# Patient Record
Sex: Male | Born: 1970
Health system: Southern US, Community
[De-identification: ages and names within clinical notes are randomized; demographics above are authoritative.]

## PROBLEM LIST (undated history)

## (undated) DIAGNOSIS — F909 Attention-deficit hyperactivity disorder, unspecified type: Secondary | ICD-10-CM

## (undated) DIAGNOSIS — S0990XA Unspecified injury of head, initial encounter: Secondary | ICD-10-CM

## (undated) DIAGNOSIS — F32A Depression, unspecified: Secondary | ICD-10-CM

## (undated) HISTORY — PX: BRAIN SURGERY: SHX531

## (undated) HISTORY — PX: SHOULDER SURGERY: SHX246

---

## 1997-05-03 DIAGNOSIS — S069XAA Unspecified intracranial injury with loss of consciousness status unknown, initial encounter: Secondary | ICD-10-CM

## 1997-05-03 DIAGNOSIS — S069X9A Unspecified intracranial injury with loss of consciousness of unspecified duration, initial encounter: Secondary | ICD-10-CM

## 1997-05-03 HISTORY — DX: Unspecified intracranial injury with loss of consciousness status unknown, initial encounter: S06.9XAA

## 1997-05-03 HISTORY — DX: Unspecified intracranial injury with loss of consciousness of unspecified duration, initial encounter: S06.9X9A

## 2003-04-24 ENCOUNTER — Emergency Department (HOSPITAL_COMMUNITY): Admission: EM | Admit: 2003-04-24 | Discharge: 2003-04-24 | Payer: Self-pay | Admitting: Emergency Medicine

## 2007-06-21 ENCOUNTER — Emergency Department (HOSPITAL_COMMUNITY): Admission: EM | Admit: 2007-06-21 | Discharge: 2007-06-21 | Payer: Self-pay | Admitting: Emergency Medicine

## 2011-01-22 LAB — D-DIMER, QUANTITATIVE: D-Dimer, Quant: <0.22

## 2011-04-20 ENCOUNTER — Other Ambulatory Visit: Payer: Self-pay

## 2011-04-20 ENCOUNTER — Encounter: Payer: Self-pay | Admitting: *Deleted

## 2011-04-20 ENCOUNTER — Emergency Department (HOSPITAL_COMMUNITY)
Admission: EM | Admit: 2011-04-20 | Discharge: 2011-04-20 | Disposition: A | Discharge status: Home / Self Care | Payer: 59 | Source: Home / Self Care | Attending: Emergency Medicine | Admitting: Emergency Medicine

## 2011-04-20 ENCOUNTER — Emergency Department (INDEPENDENT_AMBULATORY_CARE_PROVIDER_SITE_OTHER): Payer: 59

## 2011-04-20 DIAGNOSIS — J40 Bronchitis, not specified as acute or chronic: Secondary | ICD-10-CM

## 2011-04-20 DIAGNOSIS — R0789 Other chest pain: Secondary | ICD-10-CM

## 2011-04-20 HISTORY — DX: Unspecified injury of head, initial encounter: S09.90XA

## 2011-04-20 MED ORDER — HYDROCOD POLST-CHLORPHEN POLST 10-8 MG/5ML PO LQCR: 5.0000 mL | Freq: Two times a day (BID) | ORAL | Status: DC | PRN

## 2011-04-20 MED ORDER — PREDNISONE 20 MG PO TABS: 40.0000 mg | ORAL_TABLET | Freq: Every day | ORAL | Status: AC

## 2011-04-20 NOTE — ED Notes (Addendum)
Pt had a flu this past Friday, and today woke up to chest pain that comes and goes. Per Pt, he did feel pain to his left arm and back. He can't really tell if it was radiating to his left arm or if it was just pain from the chest.  Pt has no cardiac history and states his chest pain gets worst when he walks or moves. Per Pt, he has never felt this type of chest pain before.

## 2011-04-20 NOTE — ED Notes (Signed)
Pt has been evaluated by Dr. Ladon Applebaum and has had an xray already.  Came in with chest pain that started this am with coughing.  Gets worse with movement.  Has been on Tamiflu since Friday.

## 2011-04-20 NOTE — ED Provider Notes (Addendum)
History     CSN: [id number] Arrival date & time: 04/20/2011 10:53 AM   First MD Initiated Contact with Patient 04/20/11 1106      Chief Complaint  Patient presents with  . Chest Pain    (Consider location/radiation/quality/duration/timing/severity/associated sxs/prior treatment) HPI  Past Medical History  Diagnosis Date  . Head injury   . Attention deficit disorder     Past Surgical History  Procedure Date  . Brain surgery     History reviewed. No pertinent family history.  History  Substance Use Topics  . Smoking status: Never Smoker   . Smokeless tobacco: Not on file  . Alcohol Use: No      Review of Systems  Constitutional: Positive for chills and appetite change.  Respiratory: Positive for cough and chest tightness. Negative for shortness of breath.   Cardiovascular: Positive for chest pain. Negative for palpitations and leg swelling.    Allergies  Review of patient's allergies indicates no known allergies.  Home Medications   Current Outpatient Rx  Name Route Sig Dispense Refill  . AMPHETAMINE-DEXTROAMPHET ER 30 MG PO CP24 Oral Take 30 mg by mouth every morning.      Marland Kitchen AMPHETAMINE-DEXTROAMPHETAMINE 15 MG PO TABS Oral Take 15 mg by mouth 2 (two) times daily.      . CYMBALTA PO Oral Take by mouth 1 day or 1 dose.      . OSELTAMIVIR PHOSPHATE 75 MG PO CAPS Oral Take 75 mg by mouth.      Marland Kitchen HYDROCOD POLST-CHLORPHEN POLST 10-8 MG/5ML PO LQCR Oral Take 5 mLs by mouth every 12 (twelve) hours as needed. 140 mL 0  . PREDNISONE 20 MG PO TABS Oral Take 2 tablets (40 mg total) by mouth daily. Take daily for 5 days 15 tablet 0    BP 154/84  Pulse 126  Temp(Src) 98.8 F (37.1 C) (Oral)  Resp 20  SpO2 99%  Physical Exam  Nursing note and vitals reviewed. Constitutional: He is oriented to person, place, and time. He appears well-developed and well-nourished. No distress.  HENT:  Head: Normocephalic.  Eyes: Conjunctivae are normal.  Neck: Normal range of  motion. No JVD present. No tracheal deviation present.  Cardiovascular: Regular rhythm, normal heart sounds and normal pulses.   No extrasystoles are present. Tachycardia present.  PMI is not displaced.        Heart rate decreased during exam to the mid 80"s  Pulmonary/Chest: Breath sounds normal. No respiratory distress. He has no decreased breath sounds. He has no wheezes. He has no rhonchi. He has no rales.  Lymphadenopathy:    He has no cervical adenopathy.  Neurological: He is alert and oriented to person, place, and time. He has normal strength. No sensory deficit.    ED Course  Procedures (including critical care time)  Labs Reviewed - No data to display Dg Chest 2 View  04/20/2011  *RADIOLOGY REPORT*  Clinical Data: Chest pain, cough, left arm pain  CHEST - 2 VIEW  Comparison: None  Findings: Normal heart size, mediastinal contours, and pulmonary vascularity. Peribronchial thickening without infiltrate or effusion. No pneumothorax. Bones unremarkable.  IMPRESSION: Mild bronchitic changes.  Original Report Authenticated By: Lollie Marrow, M.D.     1. Bronchitis   2. Musculoskeletal chest pain       MDM  ILI  with cough and chest discomforts. On exam patient was not experiencing any pian- EKG and x-rays done-Patient did described that at times feels like it can be chest  wall or lung related pain. No orders found for this or any previous visit.       Jimmie Molly, MD 04/20/11 1528  Jimmie Molly, MD 04/20/11 [phone]

## 2011-05-11 ENCOUNTER — Emergency Department (HOSPITAL_COMMUNITY)
Admission: EM | Admit: 2011-05-11 | Discharge: 2011-05-11 | Disposition: A | Discharge status: Home / Self Care | Payer: 59 | Source: Home / Self Care | Attending: Family Medicine | Admitting: Family Medicine

## 2011-05-11 ENCOUNTER — Encounter (HOSPITAL_COMMUNITY): Payer: Self-pay

## 2011-05-11 DIAGNOSIS — S40011A Contusion of right shoulder, initial encounter: Secondary | ICD-10-CM

## 2011-05-11 DIAGNOSIS — S40019A Contusion of unspecified shoulder, initial encounter: Secondary | ICD-10-CM

## 2011-05-11 MED ORDER — IBUPROFEN 800 MG PO TABS: 800.0000 mg | ORAL_TABLET | Freq: Three times a day (TID) | ORAL | Status: AC

## 2011-05-11 NOTE — ED Provider Notes (Signed)
History     CSN: [id number]  Arrival date & time 05/11/11  8657   First MD Initiated Contact with Patient 05/11/11 0818      Chief Complaint  Patient presents with  . Shoulder Injury    (Consider location/radiation/quality/duration/timing/severity/associated sxs/prior treatment) Patient is a 41 y.o. male presenting with shoulder injury. The history is provided by the patient.  Shoulder Injury This is a new problem. The current episode started yesterday (slipped on stairs and landed on right shoulder directly). The problem has been gradually improving. Associated symptoms comments: No other injury or complaints..    Past Medical History  Diagnosis Date  . Head injury   . Attention deficit disorder     Past Surgical History  Procedure Date  . Brain surgery     No family history on file.  History  Substance Use Topics  . Smoking status: Never Smoker   . Smokeless tobacco: Not on file  . Alcohol Use: No      Review of Systems  Constitutional: Negative.   Cardiovascular: Negative.   Gastrointestinal: Negative.   Musculoskeletal: Negative for back pain and gait problem.  Neurological: Negative.     Allergies  Review of patient's allergies indicates no known allergies.  Home Medications   Current Outpatient Rx  Name Route Sig Dispense Refill  . AMPHETAMINE-DEXTROAMPHET ER 30 MG PO CP24 Oral Take 30 mg by mouth every morning.      Marland Kitchen AMPHETAMINE-DEXTROAMPHETAMINE 15 MG PO TABS Oral Take 15 mg by mouth 2 (two) times daily.      Marland Kitchen HYDROCOD POLST-CPM POLST ER 10-8 MG/5ML PO LQCR Oral Take 5 mLs by mouth every 12 (twelve) hours as needed. 140 mL 0  . CYMBALTA PO Oral Take by mouth 1 day or 1 dose.      . IBUPROFEN 800 MG PO TABS Oral Take 1 tablet (800 mg total) by mouth 3 (three) times daily. 30 tablet 0  . OSELTAMIVIR PHOSPHATE 75 MG PO CAPS Oral Take 75 mg by mouth.        BP 126/87  Pulse 92  Temp(Src) 98.7 F (37.1 C) (Oral)  Resp 16  SpO2 99%  Physical  Exam  Nursing note and vitals reviewed. Constitutional: He is oriented to person, place, and time. He appears well-developed and well-nourished.  HENT:  Head: Normocephalic.  Musculoskeletal: Normal range of motion. He exhibits tenderness.       Arms: Neurological: He is alert and oriented to person, place, and time.    ED Course  Procedures (including critical care time)  Labs Reviewed - No data to display No results found.   1. Contusion of shoulder, right       MDM          Barkley Bruns, MD 05/11/11 [phone]

## 2011-05-11 NOTE — ED Notes (Signed)
Pt states he fell yesterday and landed on his rt shoulder.  C/o dull ache to rt shoulder, pain is worse with movement.

## 2011-06-22 ENCOUNTER — Other Ambulatory Visit (HOSPITAL_COMMUNITY): Payer: Self-pay | Admitting: Orthopedic Surgery

## 2011-06-22 DIAGNOSIS — M25511 Pain in right shoulder: Secondary | ICD-10-CM

## 2011-06-23 ENCOUNTER — Ambulatory Visit (HOSPITAL_COMMUNITY)
Admission: RE | Admit: 2011-06-23 | Discharge: 2011-06-23 | Disposition: A | Discharge status: Home / Self Care | Payer: 59 | Source: Ambulatory Visit | Attending: Orthopedic Surgery | Admitting: Orthopedic Surgery

## 2011-06-23 DIAGNOSIS — W19XXXA Unspecified fall, initial encounter: Secondary | ICD-10-CM | POA: Insufficient documentation

## 2011-06-23 DIAGNOSIS — S46819A Strain of other muscles, fascia and tendons at shoulder and upper arm level, unspecified arm, initial encounter: Secondary | ICD-10-CM | POA: Insufficient documentation

## 2011-06-23 DIAGNOSIS — M25519 Pain in unspecified shoulder: Secondary | ICD-10-CM | POA: Insufficient documentation

## 2011-06-23 DIAGNOSIS — M25511 Pain in right shoulder: Secondary | ICD-10-CM

## 2011-08-19 ENCOUNTER — Encounter (HOSPITAL_COMMUNITY): Payer: Self-pay

## 2011-08-19 ENCOUNTER — Encounter (HOSPITAL_COMMUNITY)
Admission: RE | Admit: 2011-08-19 | Discharge: 2011-08-19 | Disposition: A | Discharge status: Home / Self Care | Payer: 59 | Source: Ambulatory Visit | Attending: Orthopedic Surgery | Admitting: Orthopedic Surgery

## 2011-08-19 LAB — CBC
HCT: 40.8 % (ref 39.0–52.0)
Hemoglobin: 14.5 g/dL (ref 13.0–17.0)
MCH: 29 pg (ref 26.0–34.0)
MCHC: 35.5 g/dL (ref 30.0–36.0)
MCV: 81.6 fL (ref 78.0–100.0)
Platelets: 268 10*3/uL (ref 150–400)
RBC: 5 MIL/uL (ref 4.22–5.81)
RDW: 12.6 % (ref 11.5–15.5)
WBC: 6.8 10*3/uL (ref 4.0–10.5)

## 2011-08-19 LAB — BASIC METABOLIC PANEL
BUN: 17 mg/dL (ref 6–23)
CO2: 28 mEq/L (ref 19–32)
Calcium: 9.4 mg/dL (ref 8.4–10.5)
Chloride: 103 mEq/L (ref 96–112)
Creatinine, Ser: 1.05 mg/dL (ref 0.50–1.35)
GFR calc Af Amer: >90 mL/min (ref >90)
GFR calc non Af Amer: 87 mL/min — ABNORMAL LOW (ref >90)
Glucose, Bld: 104 mg/dL — ABNORMAL HIGH (ref 70–99)
Potassium: 4 mEq/L (ref 3.5–5.1)
Sodium: 139 mEq/L (ref 135–145)

## 2011-08-19 LAB — SURGICAL PCR SCREEN
MRSA, PCR: NEGATIVE
Staphylococcus aureus: POSITIVE — AB

## 2011-08-19 MED ORDER — CHLORHEXIDINE GLUCONATE 4 % EX LIQD: 60.0000 mL | Freq: Once | CUTANEOUS | Status: DC

## 2011-08-19 NOTE — H&P (Signed)
CC: right shoulder pain HPI: 41 y/o male with worsening pain and weakness to right shoulder due to a rotator cuff tear Pt has elected for a rotator cuff repair to restore function to the right upper extremity PMH: ADD Surgical: none Family: hypertension Social: non smoker, occasional ETOH, no illicit drug use ROS: pain and weakness right upper extremity PE: alert and appropriate 41 y/o male in no acute distress Cervical spine: full rom no tenderness, cranial nerves 2-12 intact Right shoulder: mild guarding due to pain  Pain with ER and mild weakness nv intact distally No rashes or edema MRI: right shoulder rotator cuff tear Assessment: right shoulder rotator cuff tear Plan: shoulder scope and rotator cuff repair

## 2011-08-19 NOTE — Pre-Procedure Instructions (Signed)
20 MACINTYRE ALEXA  08/19/2011   Your procedure is scheduled on:  August 27, 2011 0800 am  Report to [facility] at 0600 AM.  Call this number if you have problems the morning of surgery: [phone]   Remember:   Do not eat food:After Midnight.  May have clear liquids: up to 4 Hours before arrival.0200 am  Clear liquids include soda, tea, black coffee, apple or grape juice, broth.  Take these medicines the morning of surgery with A SIP OF WATER: Cymbalta    Do not wear lotions, powders, or perfumes. You may wear deodorant.  Do not shave 48 hours prior to surgery.  Do not bring valuables to the hospital.  Contacts, dentures or bridgework may not be worn into surgery.  Leave suitcase in the car. After surgery it may be brought to your room.  For patients admitted to the hospital, checkout time is 11:00 AM the day of discharge.   Patients discharged the day of surgery will not be allowed to drive home.  Name and phone number of your driver: Spouse  Special Instructions: CHG Shower Use Special Wash: 1/2 bottle night before surgery and 1/2 bottle morning of surgery.   Please read over the following fact sheets that you were given: Pain Booklet, Coughing and Deep Breathing, MRSA Information and Surgical Site Infection Prevention

## 2011-08-26 MED ORDER — SODIUM CHLORIDE 0.9 % IV SOLN: INTRAVENOUS | Status: DC

## 2011-08-26 NOTE — Progress Notes (Signed)
Spoke with the patient... He will arrive at [facility] 08/27/11.Marland Kitchen336-

## 2011-08-27 ENCOUNTER — Ambulatory Visit (HOSPITAL_COMMUNITY): Payer: 59 | Admitting: Anesthesiology

## 2011-08-27 ENCOUNTER — Ambulatory Visit (HOSPITAL_COMMUNITY)
Admission: RE | Admit: 2011-08-27 | Discharge: 2011-08-27 | Disposition: A | Discharge status: Home / Self Care | Payer: 59 | Source: Ambulatory Visit | Attending: Orthopedic Surgery | Admitting: Orthopedic Surgery

## 2011-08-27 ENCOUNTER — Encounter (HOSPITAL_COMMUNITY): Payer: Self-pay | Admitting: Anesthesiology

## 2011-08-27 ENCOUNTER — Encounter (HOSPITAL_COMMUNITY): Payer: Self-pay | Admitting: *Deleted

## 2011-08-27 ENCOUNTER — Encounter (HOSPITAL_COMMUNITY)
Admission: RE | Disposition: A | Discharge status: Home / Self Care | Payer: Self-pay | Source: Ambulatory Visit | Attending: Orthopedic Surgery

## 2011-08-27 DIAGNOSIS — M719 Bursopathy, unspecified: Secondary | ICD-10-CM | POA: Insufficient documentation

## 2011-08-27 DIAGNOSIS — Z5333 Arthroscopic surgical procedure converted to open procedure: Secondary | ICD-10-CM | POA: Insufficient documentation

## 2011-08-27 DIAGNOSIS — Z01812 Encounter for preprocedural laboratory examination: Secondary | ICD-10-CM | POA: Insufficient documentation

## 2011-08-27 DIAGNOSIS — Z01818 Encounter for other preprocedural examination: Secondary | ICD-10-CM | POA: Insufficient documentation

## 2011-08-27 DIAGNOSIS — M67919 Unspecified disorder of synovium and tendon, unspecified shoulder: Secondary | ICD-10-CM | POA: Insufficient documentation

## 2011-08-27 DIAGNOSIS — F988 Other specified behavioral and emotional disorders with onset usually occurring in childhood and adolescence: Secondary | ICD-10-CM | POA: Insufficient documentation

## 2011-08-27 DIAGNOSIS — M24119 Other articular cartilage disorders, unspecified shoulder: Secondary | ICD-10-CM | POA: Insufficient documentation

## 2011-08-27 SURGERY — SHOULDER ARTHROSCOPY WITH SUBACROMIAL DECOMPRESSION AND BICEP TENDON REPAIR
Anesthesia: Regional | Site: Shoulder | Laterality: Right | Wound class: Clean

## 2011-08-27 MED ORDER — BUPIVACAINE-EPINEPHRINE PF 0.5-1:200000 % IJ SOLN
INTRAMUSCULAR | Status: DC | PRN
  Administered 2011-08-27: 30 mL

## 2011-08-27 MED ORDER — SODIUM CHLORIDE 0.9 % IV SOLN
10.0000 mg | INTRAVENOUS | Status: DC | PRN
  Administered 2011-08-27: 50 ug/min via INTRAVENOUS

## 2011-08-27 MED ORDER — ONDANSETRON HCL 4 MG/2ML IJ SOLN
INTRAMUSCULAR | Status: DC | PRN
  Administered 2011-08-27: 4 mg via INTRAVENOUS

## 2011-08-27 MED ORDER — FENTANYL CITRATE 0.05 MG/ML IJ SOLN
INTRAMUSCULAR | Status: DC | PRN
  Administered 2011-08-27: 50 ug via INTRAVENOUS

## 2011-08-27 MED ORDER — LIDOCAINE HCL (CARDIAC) 20 MG/ML IV SOLN
INTRAVENOUS | Status: DC | PRN
  Administered 2011-08-27: 100 mg via INTRAVENOUS

## 2011-08-27 MED ORDER — CEFAZOLIN SODIUM 1-5 GM-% IV SOLN
INTRAVENOUS | Status: AC
  Filled 2011-08-27: qty 100

## 2011-08-27 MED ORDER — BUPIVACAINE-EPINEPHRINE 0.25% -1:200000 IJ SOLN
INTRAMUSCULAR | Status: DC | PRN
  Administered 2011-08-27: 10 mL

## 2011-08-27 MED ORDER — VECURONIUM BROMIDE 10 MG IV SOLR
INTRAVENOUS | Status: DC | PRN
  Administered 2011-08-27: 5 mg via INTRAVENOUS

## 2011-08-27 MED ORDER — LACTATED RINGERS IV SOLN
INTRAVENOUS | Status: DC | PRN
  Administered 2011-08-27 (×2): via INTRAVENOUS

## 2011-08-27 MED ORDER — METHOCARBAMOL 500 MG PO TABS: 500.0000 mg | ORAL_TABLET | Freq: Three times a day (TID) | ORAL | Status: AC | PRN

## 2011-08-27 MED ORDER — PROPOFOL 10 MG/ML IV EMUL
INTRAVENOUS | Status: DC | PRN
  Administered 2011-08-27: 300 mg via INTRAVENOUS

## 2011-08-27 MED ORDER — MIDAZOLAM HCL 5 MG/5ML IJ SOLN
INTRAMUSCULAR | Status: DC | PRN
  Administered 2011-08-27: 2 mg via INTRAVENOUS

## 2011-08-27 MED ORDER — CEFAZOLIN SODIUM-DEXTROSE 2-3 GM-% IV SOLR
2.0000 g | Freq: Three times a day (TID) | INTRAVENOUS | Status: DC
  Administered 2011-08-27: 2 g via INTRAVENOUS

## 2011-08-27 MED ORDER — PHENYLEPHRINE HCL 10 MG/ML IJ SOLN
INTRAMUSCULAR | Status: DC | PRN
  Administered 2011-08-27: 80 ug via INTRAVENOUS
  Administered 2011-08-27: 120 ug via INTRAVENOUS
  Administered 2011-08-27: 80 ug via INTRAVENOUS

## 2011-08-27 MED ORDER — ONDANSETRON HCL 4 MG/2ML IJ SOLN: 4.0000 mg | Freq: Once | INTRAMUSCULAR | Status: DC | PRN

## 2011-08-27 MED ORDER — OXYCODONE-ACETAMINOPHEN 5-325 MG PO TABS: 1.0000 | ORAL_TABLET | ORAL | Status: AC | PRN

## 2011-08-27 MED ORDER — SODIUM CHLORIDE 0.9 % IR SOLN
Status: DC | PRN
  Administered 2011-08-27: 6000 mL

## 2011-08-27 MED ORDER — HYDROMORPHONE HCL PF 1 MG/ML IJ SOLN: 0.2500 mg | INTRAMUSCULAR | Status: DC | PRN

## 2011-08-27 SURGICAL SUPPLY — 78 items
ANCH SUT 1.4 1 LD SFT TIS (Anchor) ×1 IMPLANT
ANCH SUT 4.5 SUBCORTICAL WNG (Anchor) ×2 IMPLANT
ANCHOR JUGGERKNOT SZ1 (Anchor) ×1 IMPLANT
ANCHOR SUT CROSSFT 4.5 #2 (Anchor) ×2 IMPLANT
ANCHOR SUT POPLOK 4.5 (Anchor) ×2 IMPLANT
BLADE LONG MED 31X9 (MISCELLANEOUS) IMPLANT
BLADE SURG 11 STRL SS (BLADE) ×2 IMPLANT
BUR OVAL 4.0 (BURR) IMPLANT
CATH URET WHISTLE 8FR 331008 (CATHETERS) ×1 IMPLANT
CLOTH BEACON ORANGE TIMEOUT ST (SAFETY) ×2 IMPLANT
CLSR STERI-STRIP ANTIMIC 1/2X4 (GAUZE/BANDAGES/DRESSINGS) ×1 IMPLANT
COVER SURGICAL LIGHT HANDLE (MISCELLANEOUS) ×2 IMPLANT
DRAPE INCISE IOBAN 66X45 STRL (DRAPES) ×2 IMPLANT
DRAPE STERI 35X30 U-POUCH (DRAPES) ×2 IMPLANT
DRAPE U-SHAPE 47X51 STRL (DRAPES) ×2 IMPLANT
DRILL BIT 5/64 (BIT) ×2 IMPLANT
DRSG ADAPTIC 3X8 NADH LF (GAUZE/BANDAGES/DRESSINGS) ×1 IMPLANT
DRSG EMULSION OIL 3X3 NADH (GAUZE/BANDAGES/DRESSINGS) ×4 IMPLANT
DRSG PAD ABDOMINAL 8X10 ST (GAUZE/BANDAGES/DRESSINGS) ×3 IMPLANT
DURAPREP 26ML APPLICATOR (WOUND CARE) ×2 IMPLANT
ELECT NDL TIP 2.8 STRL (NEEDLE) ×1 IMPLANT
ELECT NEEDLE TIP 2.8 STRL (NEEDLE) ×2 IMPLANT
ELECT REM PT RETURN 9FT ADLT (ELECTROSURGICAL)
ELECTRODE REM PT RTRN 9FT ADLT (ELECTROSURGICAL) IMPLANT
GLOVE BIOGEL PI ORTHO PRO 7.5 (GLOVE) ×1
GLOVE BIOGEL PI ORTHO PRO SZ8 (GLOVE) ×1
GLOVE ORTHO TXT STRL SZ7.5 (GLOVE) ×2 IMPLANT
GLOVE PI ORTHO PRO STRL 7.5 (GLOVE) ×1 IMPLANT
GLOVE PI ORTHO PRO STRL SZ8 (GLOVE) ×1 IMPLANT
GLOVE SURG ORTHO 8.5 STRL (GLOVE) ×2 IMPLANT
GOWN BRE IMP SLV AUR XL STRL (GOWN DISPOSABLE) ×2 IMPLANT
GOWN STRL NON-REIN LRG LVL3 (GOWN DISPOSABLE) ×3 IMPLANT
GOWN STRL REIN XL XLG (GOWN DISPOSABLE) ×4 IMPLANT
JUGGERKNOT DISPOSABLE KIT ×1 IMPLANT
KIT BASIN OR (CUSTOM PROCEDURE TRAY) ×2 IMPLANT
KIT BIO-TENODESIS 3X8 DISP (MISCELLANEOUS)
KIT INSRT BABSR STRL DISP BTN (MISCELLANEOUS) IMPLANT
KIT JUGGERKNOT DISP 2.9MM (KITS) IMPLANT
KIT ROOM TURNOVER OR (KITS) ×2 IMPLANT
MANIFOLD NEPTUNE II (INSTRUMENTS) ×2 IMPLANT
NDL 1/2 CIR MAYO (NEEDLE) IMPLANT
NDL HYPO 25GX1X1/2 BEV (NEEDLE) ×1 IMPLANT
NDL SPNL 18GX3.5 QUINCKE PK (NEEDLE) ×1 IMPLANT
NDL SUT 6 .5 CRC .975X.05 MAYO (NEEDLE) IMPLANT
NEEDLE 1/2 CIR MAYO (NEEDLE) ×2 IMPLANT
NEEDLE HYPO 25GX1X1/2 BEV (NEEDLE) ×2 IMPLANT
NEEDLE MAYO TAPER (NEEDLE)
NEEDLE SPNL 18GX3.5 QUINCKE PK (NEEDLE) ×2 IMPLANT
NS IRRIG 1000ML POUR BTL (IV SOLUTION) ×2 IMPLANT
PACK SHOULDER (CUSTOM PROCEDURE TRAY) ×2 IMPLANT
PAD ARMBOARD 7.5X6 YLW CONV (MISCELLANEOUS) ×4 IMPLANT
RESECTOR FULL RADIUS 4.2MM (BLADE) IMPLANT
SET ARTHROSCOPY TUBING (MISCELLANEOUS) ×2
SET ARTHROSCOPY TUBING LN (MISCELLANEOUS) ×1 IMPLANT
SET JUGGERKNOT DISP 1.4MM ×1 IMPLANT
SLING ARM FOAM STRAP LRG (SOFTGOODS) ×2 IMPLANT
SLING ARM FOAM STRAP MED (SOFTGOODS) IMPLANT
SPONGE GAUZE 4X4 12PLY (GAUZE/BANDAGES/DRESSINGS) ×2 IMPLANT
SPONGE LAP 4X18 X RAY DECT (DISPOSABLE) ×2 IMPLANT
STRIP CLOSURE SKIN 1/2X4 (GAUZE/BANDAGES/DRESSINGS) ×2 IMPLANT
SUCTION FRAZIER TIP 10 FR DISP (SUCTIONS) ×2 IMPLANT
SUT BONE WAX W31G (SUTURE) IMPLANT
SUT FIBERWIRE #2 38 T-5 BLUE (SUTURE)
SUT MNCRL AB 4-0 PS2 18 (SUTURE) ×2 IMPLANT
SUT VIC AB 0 CT1 27 (SUTURE)
SUT VIC AB 0 CT1 27XBRD ANBCTR (SUTURE) IMPLANT
SUT VIC AB 0 CT2 27 (SUTURE) IMPLANT
SUT VIC AB 2-0 CT1 27 (SUTURE)
SUT VIC AB 2-0 CT1 TAPERPNT 27 (SUTURE) IMPLANT
SUT VICRYL 0 CT 1 36IN (SUTURE) ×6 IMPLANT
SUTURE FIBERWR #2 38 T-5 BLUE (SUTURE) IMPLANT
SYR CONTROL 10ML LL (SYRINGE) ×2 IMPLANT
TAPE CLOTH SURG 6X10 WHT LF (GAUZE/BANDAGES/DRESSINGS) ×1 IMPLANT
TOWEL OR 17X24 6PK STRL BLUE (TOWEL DISPOSABLE) ×2 IMPLANT
TOWEL OR 17X26 10 PK STRL BLUE (TOWEL DISPOSABLE) ×2 IMPLANT
TUBE CONNECTING 12X1/4 (SUCTIONS) ×2 IMPLANT
WAND 90 DEG TURBOVAC W/CORD (SURGICAL WAND) ×2 IMPLANT
WATER STERILE IRR 1000ML POUR (IV SOLUTION) ×2 IMPLANT

## 2011-08-27 NOTE — Anesthesia Procedure Notes (Addendum)
Anesthesia Regional Block:  Interscalene brachial plexus block  Pre-Anesthetic Checklist: ,, timeout performed, Correct Patient, Correct Site, Correct Laterality, Correct Procedure, Correct Position, site marked, Risks and benefits discussed,  Surgical consent,  Pre-op evaluation,  At surgeon's request and post-op pain management  Laterality: Right  Prep: chloraprep       Needles:  Injection technique: Single-shot  Needle Type: Echogenic Stimulator Needle     Needle Length: 5cm 5 cm Needle Gauge: 22 and 22 G    Additional Needles:  Procedures: ultrasound guided and nerve stimulator Interscalene brachial plexus block  Nerve Stimulator or Paresthesia:  Response: biceps flexion, 0.45 mA,   Additional Responses:   Narrative:  Start time: 08/27/2011 6:56 AM End time: 08/27/2011 7:10 AM Injection made incrementally with aspirations every 5 mL.  Performed by: Personally  Anesthesiologist: Dr Chaney Malling  Additional Notes: Functioning IV was confirmed and monitors were applied.  A 50mm 22ga Arrow echogenic stimulator needle was used. Sterile prep and drape,hand hygiene and sterile gloves were used.  Negative aspiration and negative test dose prior to incremental administration of local anesthetic. The patient tolerated the procedure well.  Ultrasound guidance: relevent anatomy identified, needle position confirmed, local anesthetic spread visualized around nerve(s), vascular puncture avoided.  Image printed for medical record.   Interscalene brachial plexus block Procedure Name: Intubation Date/Time: 08/27/2011 7:34 AM Performed by: Carmela Rima Pre-anesthesia Checklist: Emergency Drugs available, Patient identified, Timeout performed, Suction available and Patient being monitored Patient Re-evaluated:Patient Re-evaluated prior to inductionOxygen Delivery Method: Circle system utilized Preoxygenation: Pre-oxygenation with 100% oxygen Intubation Type: IV induction Ventilation:  Mask ventilation without difficulty Laryngoscope Size: Mac and 3 Grade View: Grade I Tube type: Oral Tube size: 8.0 mm Number of attempts: 1 Placement Confirmation: ETT inserted through vocal cords under direct vision,  breath sounds checked- equal and bilateral and positive ETCO2 Secured at: 23 cm Tube secured with: Tape Dental Injury: Teeth and Oropharynx as per pre-operative assessment     Anesthesia Regional Block:   Narrative:

## 2011-08-27 NOTE — Preoperative (Signed)
Beta Blockers   Reason not to administer Beta Blockers:Not Applicable 

## 2011-08-27 NOTE — Discharge Instructions (Signed)
Do pendulum exercises 4 times per day. Keep pillow OR pillow sling under the right arm. Follow up with PT early next week and Dr Ranell Patrick in 2 weeks.  Ice shoulder constantly!!  Change bandages to bandaids on Sunday, ok to shower next Wednesday.

## 2011-08-27 NOTE — Anesthesia Postprocedure Evaluation (Signed)
  Anesthesia Post-op Note  Patient: Adam Bates  Procedure(s) Performed: Procedure(s) (LRB): SHOULDER ARTHROSCOPY WITH SUBACROMIAL DECOMPRESSION AND BICEP TENDON REPAIR (Right)  Patient Location: PACU  Anesthesia Type: GA combined with regional for post-op pain  Level of Consciousness: awake, alert , oriented and patient cooperative  Airway and Oxygen Therapy: Patient Spontanous Breathing and Patient connected to nasal cannula oxygen  Post-op Pain: none  Post-op Assessment: Post-op Vital signs reviewed, Patient's Cardiovascular Status Stable, Respiratory Function Stable, Patent Airway, No signs of Nausea or vomiting and Pain level controlled  Post-op Vital Signs: stable  Complications: No apparent anesthesia complications

## 2011-08-27 NOTE — Anesthesia Preprocedure Evaluation (Addendum)
Anesthesia Evaluation  Patient identified by MRN, date of birth, ID band Patient awake    Reviewed: Allergy & Precautions, H&P , NPO status , Patient's Chart, lab work & pertinent test results  History of Anesthesia Complications (+) DIFFICULT AIRWAY and AWARENESS UNDER ANESTHESIA  Airway Mallampati: I TM Distance: >3 FB Neck ROM: full  Mouth opening: Limited Mouth Opening  Dental  (+) Dental Advidsory Given and Teeth Intact   Pulmonary          Cardiovascular     Neuro/Psych PSYCHIATRIC DISORDERS    GI/Hepatic   Endo/Other    Renal/GU      Musculoskeletal   Abdominal   Peds  Hematology   Anesthesia Other Findings   Reproductive/Obstetrics                          Anesthesia Physical Anesthesia Plan  ASA: I  Anesthesia Plan: General   Post-op Pain Management:    Induction: Intravenous  Airway Management Planned: Oral ETT  Additional Equipment:   Intra-op Plan:   Post-operative Plan:   Informed Consent: I have reviewed the patients History and Physical, chart, labs and discussed the procedure including the risks, benefits and alternatives for the proposed anesthesia with the patient or authorized representative who has indicated his/her understanding and acceptance.   Dental Advisory Given  Plan Discussed with: CRNA, Anesthesiologist and Surgeon  Anesthesia Plan Comments:        Anesthesia Quick Evaluation

## 2011-08-27 NOTE — Transfer of Care (Signed)
Immediate Anesthesia Transfer of Care Note  Patient: Adam Bates  Procedure(s) Performed: Procedure(s) (LRB): SHOULDER ARTHROSCOPY WITH SUBACROMIAL DECOMPRESSION AND BICEP TENDON REPAIR (Right)  Patient Location: PACU  Anesthesia Type: General  Level of Consciousness: awake and oriented  Airway & Oxygen Therapy: Patient Spontanous Breathing and Patient connected to nasal cannula oxygen  Post-op Assessment: Report given to PACU RN, Post -op Vital signs reviewed and stable and Patient moving all extremities  Post vital signs: Reviewed and stable  Complications: No apparent anesthesia complications

## 2011-08-27 NOTE — Progress Notes (Signed)
Orthopedic Tech Progress Note Patient Details:  Adam Bates July 22, 1970 [id number]  Other Ortho Devices Type of Ortho Device: Shoulder abduction pillow Ortho Device Location: [facility]  Ortho Device Interventions: Application   Cammer, Mickie Bail 08/27/2011, 8:58 AM

## 2011-08-27 NOTE — Interval H&P Note (Signed)
History and Physical Interval Note:  08/27/2011 7:22 AM  [name]  has presented today for surgery, with the diagnosis of RIGHT SHOULDER ROTATOR CUFF TEAR AND SUBSCAP TEAR RIGHT SIDE  The various methods of treatment have been discussed with the patient and family. After consideration of risks, benefits and other options for treatment, the patient has consented to  Procedure(s) (LRB): SHOULDER ARTHROSCOPY WITH SUBACROMIAL DECOMPRESSION AND BICEP TENDON REPAIR (Right) as a surgical intervention .  The patients' history has been reviewed, patient examined, no change in status, stable for surgery.  I have reviewed the patients' chart and labs.  Questions were answered to the patient's satisfaction.     Angel Weedon,STEVEN R

## 2011-08-27 NOTE — Brief Op Note (Signed)
08/27/2011  9:53 AM  PATIENT:  Adam Bates  41 y.o. male  PRE-OPERATIVE DIAGNOSIS:  RIGHT SHOULDER ROTATOR CUFF TEAR AND SUBSCAP TEAR RIGHT SIDE, SLAP LESION  POST-OPERATIVE DIAGNOSIS:  RIGHT SHOULDER ROTATOR CUFF TEAR AND SUBSCAP TEAR, SLAP LESION  PROCEDURE:  Procedure(s) (LRB): SHOULDER ARTHROSCOPY WITH SUBACROMIAL DECOMPRESSION AND BICEP TENDODESIS, MINI OPEN RCR, SUBSCAP REPAIR  SURGEON:  Surgeon(s) and Role:    * Verlee Rossetti, MD - Primary  PHYSICIAN ASSISTANT:   ASSISTANTS: Thea Gist, PA-C   ANESTHESIA:   General + ISB  EBL:  Total I/O In: 1600 [I.V.:1600] Out: 200 [Blood:200]  BLOOD ADMINISTERED:none  DRAINS: none   LOCAL MEDICATIONS USED:  MARCAINE     SPECIMEN:  No Specimen  DISPOSITION OF SPECIMEN:  N/A  COUNTS:  YES  TOURNIQUET:  * No tourniquets in log *  DICTATION: .Other Dictation: Dictation Number [id number]  PLAN OF CARE: Discharge to home after PACU  PATIENT DISPOSITION:  PACU - hemodynamically stable.   Delay start of Pharmacological VTE agent (>24hrs) due to surgical blood loss or risk of bleeding: not applicable

## 2011-08-28 NOTE — Op Note (Signed)
Adam Bates, Adam Bates               ACCOUNT NO.:  [id number]  MEDICAL RECORD NO.:  [id number]  LOCATION:  [facility]                         FACILITY:  [facility]  PHYSICIAN:  Almedia Balls. Ranell Patrick, M.D. DATE OF BIRTH:  [birth year]  DATE OF PROCEDURE:  08/27/2011 DATE OF DISCHARGE:                              OPERATIVE REPORT   PREOPERATIVE DIAGNOSIS: 1. Right shoulder rotator cuff tear. 2. Right shoulder SLAP lesion. 3. Right shoulder subscapularis tear.  POSTOP DIAGNOSIS: 1. Right shoulder rotator cuff tear. 2. Right shoulder SLAP lesion. 3. Right shoulder subscapularis tear.  PROCEDURE PERFORMED:  Right shoulder arthroscopy with extensive surgical debridement including debridement of torn superior labrum anterior- posterior arthroscopic biceps tenotomy, arthroscopic subacromial decompression followed by mini open rotator cuff repair, subscap repair, and biceps tenodesis in the groove.  ATTENDING SURGEON:  Almedia Balls. Ranell Patrick, M.D.  ASSISTANT:  Donnie Coffin. Dixon, P.A., who scrubbed during the entire procedure necessary for safe completion of the procedure.  ANESTHESIA:  General anesthesia plus interscalene block anesthesia was used.  ESTIMATED BLOOD LOSS:  Minimal.  FLUID REPLACEMENT:  1200 mL crystalloid,  INSTRUMENT COUNT:  Correct.  COMPLICATIONS:  No complications.  PERIOPERATIVE ANTIBIOTICS:  Perioperative antibiotics were given.  INDICATIONS:  The patient is a 41 year old male with worsening right shoulder pain and functional loss secondary to rotator cuff tear and subscap tear as well as likely a superior labral tear anterior posterior.  Patient has failed all measures of conservative management. MRI scan positive.  The patient presents for operative treatment to restore function, eliminate pain to her shoulder.  Informed consent obtained.  DESCRIPTION OF PROCEDURE:  After an adequate level of anesthesia was achieved, the patient positioned in modified beach-chair  position. Right shoulder exam under anesthesia.  Full passive range of motion of the shoulder, no stiffness, no instability.  Sterile prep and drape, the shoulder and arm and time out was called.  We entered the shoulder using standard portals including anterior, posterior, and lateral portals.  We identified significant tearing in superior labral biceps junction, performed labral debridement, biceps tenotomy.  We debrided back to stable labral rim.  Anterior inferior, posterior inferior labrum intact, articular cartilage normal.  Rotator cuff tear was noted.  This was supraspinatus infraspinatus with traction in the joint.  Upper surface of subscap was torn.  This represented about 10-15% thickness tear.  At this point, we concluded the arthroscopic portion of procedure.  We then entered the subacromial space.  A thorough Bursectomy, acromioplasty was performed creating a type 1 acromial shape with a high-speed bur.  We visualized the torn rotator cuff in the bursal surface and it was again about retracted halfway to the glenoid. We did release the rotator cuff off the superior glenoid and also from underneath the scapula and acromion.  At this point, we concluded the arthroscopic portion of surgery.  We then made a mini open incision starting at the anterolateral border acromion staying distally about 4 cm.  Dissection down through subcu tissues using needle tip Bovie. Deltoid raphe was identified and divided in line with its fibers.  We placed Arthrex retractor.  We then visualized the biceps tendon.  We delivered that into the wound.  We placed the single juggernaut suture anchors, 2-4 biceps groove brought the suture up through the biceps tendon and then we used a baseball stitch technique to do multiple passes of that suture through the tendon and then tied that down, flushed to the bicipital groove.  The upper subscap was torn, it was a fairly small tear, and we incorporated that  in to the repair of the distal supraspinatus with a corner suture anchor at the articular margin, and used that suture to repair the upper subscap as well as the supraspinatus and then did some margin convergence sutures to further reinforce the subscapularis.  We had two 4.5 ConMed biocomposite suture anchors at the articular margin for medial portion of footprint.  We used 2 pop lock anchors out.  These were knotless anchors out laterally and then took our suture strands that we put through the edge of the tendon to free up the tendon.  These were modified Mason-Allen suture with number 2 hi-fi suture.  We did some margin convergence posteriorly and anteriorly with excellent repair, anatomic repair of the supraspinatus infraspinatus tendon, that was torn.  The repair was under tension with the patient's arm at his side, but with just 20 degrees abduction, the tear was not under significant tension, and there was no gapping and repaired all with fully range of the shoulder, no significant loss of external rotation.  We did get out to 60 degrees of external rotation without significant tension on the repair, and we then thoroughly irrigated the subdeltoid interval repair, deltoid anatomically side-to-side with 0 Vicryl suture followed by 2-0 Vicryl for subcutaneous closure and 4-0 Monocryl for skin and portals Steri- Strips applied followed by sterile dressing.     Almedia Balls. Ranell Patrick, M.D.     SRN/MEDQ  D:  08/27/2011  T:  08/27/2011  Job:  [id number]

## 2011-09-27 ENCOUNTER — Emergency Department (HOSPITAL_COMMUNITY)
Admission: EM | Admit: 2011-09-27 | Discharge: 2011-09-27 | Disposition: A | Discharge status: Home / Self Care | Payer: 59 | Source: Home / Self Care | Attending: Emergency Medicine | Admitting: Emergency Medicine

## 2011-09-27 ENCOUNTER — Encounter (HOSPITAL_COMMUNITY): Payer: Self-pay | Admitting: *Deleted

## 2011-09-27 DIAGNOSIS — S61209A Unspecified open wound of unspecified finger without damage to nail, initial encounter: Secondary | ICD-10-CM

## 2011-09-27 DIAGNOSIS — S61210A Laceration without foreign body of right index finger without damage to nail, initial encounter: Secondary | ICD-10-CM

## 2011-09-27 DIAGNOSIS — Z23 Encounter for immunization: Secondary | ICD-10-CM

## 2011-09-27 MED ORDER — TETANUS-DIPHTH-ACELL PERTUSSIS 5-2.5-18.5 LF-MCG/0.5 IM SUSP
0.5000 mL | Freq: Once | INTRAMUSCULAR | Status: AC
  Administered 2011-09-27: 0.5 mL via INTRAMUSCULAR

## 2011-09-27 MED ORDER — TETANUS-DIPHTH-ACELL PERTUSSIS 5-2.5-18.5 LF-MCG/0.5 IM SUSP
INTRAMUSCULAR | Status: AC
  Filled 2011-09-27: qty 0.5

## 2011-09-27 NOTE — Discharge Instructions (Signed)
Return here in 10 days for wound check and suture removal. Return sooner for any signs of infection. Take 1 g of Tylenol 6 mg ibuprofen up to 4 times a day as needed for pain. Do not exceed more than 4 g of acetaminophen in one day.

## 2011-09-27 NOTE — ED Provider Notes (Signed)
History     CSN: [id number]  Arrival date & time 09/27/11  1626   First MD Initiated Contact with Patient 09/27/11 1655      Chief Complaint  Patient presents with  . Extremity Laceration    (Consider location/radiation/quality/duration/timing/severity/associated sxs/prior treatment) HPI Comments: Patient is left-handed male who states that he cut the dorsal aspect of his right index finger on some garden shears several hours ago. He irrigated it with tap water, apply hydrogen peroxide, and applied pressure with hemostasis and presented here. No weakness, paresthesias, limitation of motion, swelling, redness extending of finger. Patient is able to move his finger of difficulty. Tetanus is unknown. He is not a smoker or diabetic.  Patient is a 41 y.o. male presenting with skin laceration. The history is provided by the patient. No language interpreter was used.  Laceration  The incident occurred 1 to 2 hours ago. The laceration is located on the right hand. The laceration is 1 cm in size. The laceration mechanism was a a dirty knife. He reports no foreign bodies present. His tetanus status is unknown.    Past Medical History  Diagnosis Date  . Head injury   . Attention deficit disorder     Past Surgical History  Procedure Date  . Brain surgery   . Shoulder surgery     Family History  Problem Relation Age of Onset  . Anesthesia problems Neg Hx   . Hypotension Neg Hx   . Malignant hyperthermia Neg Hx   . Pseudochol deficiency Neg Hx     History  Substance Use Topics  . Smoking status: Never Smoker   . Smokeless tobacco: Never Used  . Alcohol Use: No      Review of Systems  Allergies  Review of patient's allergies indicates no known allergies.  Home Medications   Current Outpatient Rx  Name Route Sig Dispense Refill  . AMPHETAMINE-DEXTROAMPHET ER 30 MG PO CP24 Oral Take 60 mg by mouth every morning.     Marland Kitchen AMPHETAMINE-DEXTROAMPHETAMINE 10 MG PO TABS Oral Take  10 mg by mouth 2 (two) times daily.    . DULOXETINE HCL 60 MG PO CPEP Oral Take 60 mg by mouth daily.    Marland Kitchen PERCOCET PO Oral Take by mouth as needed. For shoulder surgery 06/2011      BP 139/89  Pulse 92  Temp(Src) 98.1 F (36.7 C) (Oral)  Resp 18  SpO2 99%  Physical Exam  Nursing note and vitals reviewed. Constitutional: He is oriented to person, place, and time. He appears well-developed and well-nourished.  HENT:  Head: Normocephalic and atraumatic.  Eyes: Conjunctivae and EOM are normal.  Neck: Normal range of motion.  Cardiovascular: Normal rate.   Pulmonary/Chest: Effort normal. No respiratory distress.  Abdominal: He exhibits no distension.  Musculoskeletal: Normal range of motion.       Hands:      Avulsion laceration of right index finger see drawing sensation and 2-point discrimination intact. FDP/FDS 5/5, finger extension 5/5 compared to other fingers. No foreign bodies visualized. No injury to the hand.   Neurological: He is alert and oriented to person, place, and time.  Skin: Skin is warm and dry.  Psychiatric: He has a normal mood and affect. His behavior is normal.    ED Course  LACERATION REPAIR Date/Time: 09/27/2011 8:06 PM Performed by: Luiz Blare Authorized by: Luiz Blare Consent: Verbal consent obtained. Risks and benefits: risks, benefits and alternatives were discussed Consent given by: patient Patient  understanding: patient states understanding of the procedure being performed Patient consent: the patient's understanding of the procedure matches consent given Required items: required blood products, implants, devices, and special equipment available Patient identity confirmed: verbally with patient Time out: Immediately prior to procedure a "time out" was called to verify the correct patient, procedure, equipment, support staff and site/side marked as required. Body area: upper extremity Location details: right index  finger Laceration length: 1 cm Contamination: The wound is contaminated. Foreign bodies: no foreign bodies Tendon involvement: none Nerve involvement: none Vascular damage: no Anesthesia: digital block Local anesthetic: lidocaine 2% without epinephrine Anesthetic total: 3 ml Patient sedated: no Preparation: Patient was prepped and draped in the usual sterile fashion. Irrigation solution: tap water (Chlorhexidine surgical scrub) Irrigation method: tap Amount of cleaning: extensive Debridement: minimal Degree of undermining: none Skin closure: 5-0 nylon Number of sutures: 5 Technique: simple and horizontal mattress Approximation: close Approximation difficulty: complex Dressing: 4x4 sterile gauze, splint, antibiotic ointment and pressure dressing Patient tolerance: Patient tolerated the procedure well with no immediate complications. Comments: Explored wound through full range of motion with adequate hemostasis. No vascular, tendon nerve, bone involvement noted.   (including critical care time)  Labs Reviewed - No data to display No results found.   1. Laceration of right index finger w/o foreign body w/o damage to nail       MDM  Updated tetanus. Splinted finger. Home with Percocet and ibuprofen. He will return here in 10 days for reevaluation and suture removal. Discussed signs and symptoms that should prompt his return.  Luiz Blare, MD 09/29/11 [phone]

## 2011-09-27 NOTE — ED Notes (Signed)
Reports laceration to right index finger from hedge trimmers approx 3 hrs ago.  Avulsion laceration noted.  Bleeding controlled.  Full ROM of finger.  Denies parasthesias.  Had soaked finger in H2O2 at home.  Right hand currently soaking in Hibiclens solution.

## 2015-06-25 MED FILL — AMPHETAMINE SALTS 10 MG TAB: 10 | 90 days supply | Qty: 180 | Fill #0

## 2015-06-25 MED FILL — DEXTROAMP-AMPHET ER 30 MG C: 30 | 90 days supply | Qty: 180 | Fill #0

## 2015-07-04 MED FILL — DULoxetine HCL 60 MG CPEP: 60 | 90 days supply | Qty: 90 | Fill #1

## 2015-10-02 MED FILL — DEXTROAMP-AMP 10 MG TAB: 10 | 90 days supply | Qty: 180 | Fill #0

## 2015-10-02 MED FILL — DULoxetine HCL 60 MG CPEP: 60 | 90 days supply | Qty: 90 | Fill #0

## 2015-10-02 MED FILL — DEXTROAMP-AMPHET ER 30 MG C: 30 | 90 days supply | Qty: 180 | Fill #0

## 2016-01-21 MED FILL — DEXTROAMP-AMPHET ER 30 MG C: 30 | 90 days supply | Qty: 180 | Fill #0

## 2016-01-21 MED FILL — DEXTROAMP-AMP 10 MG TAB: 10 | 90 days supply | Qty: 180 | Fill #0

## 2016-04-08 MED FILL — DULoxetine HCL 60 MG CPEP: 60 | 90 days supply | Qty: 90 | Fill #1

## 2016-04-22 MED FILL — DEXTROAMP-AMPHETAMIN 10 MG: 10 | 90 days supply | Qty: 180 | Fill #0

## 2016-04-22 MED FILL — DEXTROAMP-AMPHET ER 30 MG C: 30 | 90 days supply | Qty: 180 | Fill #0

## 2016-05-21 DIAGNOSIS — H524 Presbyopia: Secondary | ICD-10-CM | POA: Diagnosis not present

## 2016-07-02 DIAGNOSIS — M25572 Pain in left ankle and joints of left foot: Secondary | ICD-10-CM | POA: Diagnosis not present

## 2016-07-30 MED FILL — ADDERALL XR 30 MG CAP SA: 30 | 90 days supply | Qty: 180 | Fill #0

## 2016-07-30 MED FILL — DEXTROAMP-AMPHETAMIN 10 MG: 10 | 90 days supply | Qty: 180 | Fill #0

## 2016-08-13 MED FILL — DULoxetine HCL 60 MG CPEP: 60 | 90 days supply | Qty: 90 | Fill #2

## 2016-11-23 MED FILL — DULoxetine HCL 60 MG CPEP: 60 | 90 days supply | Qty: 90 | Fill #0

## 2016-11-23 MED FILL — ADDERALL XR 30 MG CAP SA: 30 | 90 days supply | Qty: 180 | Fill #0

## 2017-02-21 MED FILL — AMOXICILLIN 500 MG CAPSULE: 500 | 7 days supply | Qty: 28 | Fill #0

## 2017-03-01 MED FILL — IBUPROFEN 800 MG TABS: 800 | 12 days supply | Qty: 12 | Fill #0

## 2017-03-01 MED FILL — AMOXICILLIN 500 MG CAPSULE: 500 | 5 days supply | Qty: 15 | Fill #0

## 2017-03-03 MED FILL — ADDERALL XR 30 MG CAP SA: 30 | 90 days supply | Qty: 180 | Fill #0

## 2017-03-03 MED FILL — DEXTROAMP-AMP 10 MG TAB: 10 | 90 days supply | Qty: 180 | Fill #0

## 2017-04-07 MED FILL — IBUPROFEN 800 MG TAB: 800 | 4 days supply | Qty: 12 | Fill #0

## 2017-04-07 MED FILL — AMOXICILLIN 500 MG CAPSULE: 500 | 5 days supply | Qty: 15 | Fill #0

## 2017-04-22 MED FILL — DULoxetine HCL 60 MG CPEP: 60 | 90 days supply | Qty: 90 | Fill #1

## 2017-06-02 MED FILL — ADDERALL XR 30 MG CAP SA: 30 | 90 days supply | Qty: 180 | Fill #0

## 2017-06-02 MED FILL — AMPHETAMINE-DEXTROAMPHETAMI: 10 | 90 days supply | Qty: 180 | Fill #0

## 2017-06-27 MED FILL — AMOXICILLIN 500 MG CAPSULE: 500 | 5 days supply | Qty: 15 | Fill #0

## 2017-09-02 MED FILL — DULoxetine HCL 60 MG CPEP: 60 | 90 days supply | Qty: 90 | Fill #2

## 2017-09-09 MED FILL — ADDERALL XR 30 MG CAP SA: 30 | 90 days supply | Qty: 180 | Fill #0

## 2017-09-09 MED FILL — DEXTROAMP-AMP 10 MG TAB: 10 | 90 days supply | Qty: 180 | Fill #0

## 2017-10-27 ENCOUNTER — Ambulatory Visit (INDEPENDENT_AMBULATORY_CARE_PROVIDER_SITE_OTHER): Payer: Self-pay | Admitting: Family Medicine

## 2017-10-27 ENCOUNTER — Encounter: Payer: Self-pay | Admitting: Family Medicine

## 2017-10-27 VITALS — BP 102/82 | HR 89 | Temp 98.9°F | Ht 70.0 in | Wt 208.6 lb

## 2017-10-27 DIAGNOSIS — Z0000 Encounter for general adult medical examination without abnormal findings: Secondary | ICD-10-CM

## 2017-10-27 NOTE — Patient Instructions (Signed)

## 2017-10-27 NOTE — Progress Notes (Signed)
HOYTE ZIEBELL is a 47 y.o. male who presents today with concerns of need for a employer directed annual physical exam. He reports that due to visit infrequency he was dropped by his PCP and that local facilities have a long waiting list for new patient appointments.  Review of Systems  Constitutional: Negative for chills, fever and malaise/fatigue.  HENT: Negative for congestion, ear discharge, ear pain, sinus pain and sore throat.   Eyes: Negative.   Respiratory: Negative for cough, sputum production and shortness of breath.   Cardiovascular: Negative.  Negative for chest pain.  Gastrointestinal: Negative for abdominal pain, diarrhea, nausea and vomiting.  Genitourinary: Negative for dysuria, frequency, hematuria and urgency.  Musculoskeletal: Negative for myalgias.  Skin: Negative.   Neurological: Negative for headaches.  Endo/Heme/Allergies: Negative.   Psychiatric/Behavioral: Negative.     O: Vitals:   10/27/17 1002  BP: 102/82  Pulse: 89  Temp: 98.9 F (37.2 C)  SpO2: 96%     Physical Exam  Constitutional: He is oriented to person, place, and time. Vital signs are normal. He appears well-developed and well-nourished. He is active.  Non-toxic appearance. He does not have a sickly appearance.  HENT:  Head: Normocephalic.  Right Ear: Hearing, tympanic membrane, external ear and ear canal normal.  Left Ear: Hearing, tympanic membrane, external ear and ear canal normal.  Nose: Nose normal.  Mouth/Throat: Uvula is midline and oropharynx is clear and moist.  Neck: Normal range of motion. Neck supple.  Cardiovascular: Normal rate, regular rhythm, normal heart sounds and normal pulses.  Pulmonary/Chest: Effort normal and breath sounds normal.  Abdominal: Soft. Bowel sounds are normal.  Musculoskeletal: Normal range of motion.  Lymphadenopathy:       Head (right side): No submental and no submandibular adenopathy present.       Head (left side): No submental and no  submandibular adenopathy present.    He has no cervical adenopathy.  Neurological: He is alert and oriented to person, place, and time.  Psychiatric: He has a normal mood and affect.  Vitals reviewed.  A: 1. Physical exam    P: Exam findings, diagnosis etiology and medication use and indications reviewed with patient. Follow- Up and discharge instructions provided. No emergent/urgent issues found on exam.  Patient verbalized understanding of information provided and agrees with plan of care (POC), all questions answered.  1. Physical exam WNL-completed- provided information on how to obtain a PCP.

## 2017-12-06 MED FILL — AMPHETAMINE-DEXTROAMPHETAMI: 10 | 90 days supply | Qty: 180 | Fill #0

## 2017-12-06 MED FILL — ADDERALL XR 30 MG CAP SA: 30 | 90 days supply | Qty: 180 | Fill #0

## 2018-01-06 MED FILL — DULoxetine HCL 60 MG CPEP: 60 | 90 days supply | Qty: 90 | Fill #0

## 2018-03-17 MED FILL — AMPHETAMINE-DEXTROAMPHETAMI: 10 | 90 days supply | Qty: 180 | Fill #0

## 2018-03-20 MED FILL — ADDERALL XR 30 MG CAP SA: 30 | 90 days supply | Qty: 180 | Fill #0

## 2018-04-24 MED FILL — DULOXETINE HCL 60 MG CPEP: 60 | 90 days supply | Qty: 90 | Fill #1

## 2018-06-13 MED FILL — AMPHETAMINE-DEXTROAMPHETAMI: 10 | 90 days supply | Qty: 180 | Fill #0

## 2018-06-16 MED FILL — ADDERALL XR 30 MG CAP SA: 30 | 90 days supply | Qty: 180 | Fill #0

## 2018-08-02 MED FILL — DULOXETINE HCL 60 MG CPEP: 60 | 90 days supply | Qty: 90 | Fill #0

## 2018-09-15 MED FILL — ADDERALL XR 30 MG CAP SA: 30 | 90 days supply | Qty: 180 | Fill #0

## 2018-09-15 MED FILL — AMPHETAMINE-DEXTROAMPHETAMI: 10 | 90 days supply | Qty: 180 | Fill #0

## 2018-12-12 MED FILL — AMPHETAMINE-DEXTROAMPHETAMI: 10 | 90 days supply | Qty: 180 | Fill #0

## 2018-12-12 MED FILL — ADDERALL XR 30 MG CAP SA: 30 | 90 days supply | Qty: 180 | Fill #0

## 2018-12-12 MED FILL — DULOXETINE HCL 60 MG CPEP: 60 | 90 days supply | Qty: 90 | Fill #1

## 2018-12-22 DIAGNOSIS — H524 Presbyopia: Secondary | ICD-10-CM | POA: Diagnosis not present

## 2019-03-13 MED FILL — ADDERALL XR 30 MG CAP SA: 30 | 90 days supply | Qty: 180 | Fill #0

## 2019-03-13 MED FILL — AMPHETAMINE-DEXTROAMPHETAMI: 10 | 90 days supply | Qty: 180 | Fill #0

## 2019-03-18 MED FILL — DULOXETINE HCL 60 MG CPEP: 60 | 90 days supply | Qty: 90 | Fill #2

## 2019-05-20 DIAGNOSIS — Z20828 Contact with and (suspected) exposure to other viral communicable diseases: Secondary | ICD-10-CM | POA: Diagnosis not present

## 2019-05-20 DIAGNOSIS — M791 Myalgia, unspecified site: Secondary | ICD-10-CM | POA: Diagnosis not present

## 2019-05-20 DIAGNOSIS — R509 Fever, unspecified: Secondary | ICD-10-CM | POA: Diagnosis not present

## 2019-06-11 MED FILL — AMPHETAMINE-DEXTROAMPHETAMI: 10 | 90 days supply | Qty: 180 | Fill #0

## 2019-06-11 MED FILL — ADDERALL XR 30 MG CAP SA: 30 | 90 days supply | Qty: 180 | Fill #0

## 2019-07-24 MED FILL — DULOXETINE HCL 60 MG CPEP: 60 | 90 days supply | Qty: 90 | Fill #0

## 2020-04-17 ENCOUNTER — Emergency Department (HOSPITAL_COMMUNITY): Payer: Managed Care, Other (non HMO)

## 2020-04-17 ENCOUNTER — Emergency Department (HOSPITAL_COMMUNITY): Payer: Managed Care, Other (non HMO) | Admitting: Anesthesiology

## 2020-04-17 ENCOUNTER — Inpatient Hospital Stay (HOSPITAL_COMMUNITY)
Admission: EM | Admit: 2020-04-17 | Discharge: 2020-05-09 | DRG: 003 | Disposition: A | Discharge status: Rehabilitation Facility | Payer: Managed Care, Other (non HMO) | Attending: Physician Assistant | Admitting: Physician Assistant

## 2020-04-17 ENCOUNTER — Other Ambulatory Visit: Payer: Self-pay

## 2020-04-17 ENCOUNTER — Encounter (HOSPITAL_COMMUNITY): Payer: Self-pay | Admitting: Emergency Medicine

## 2020-04-17 ENCOUNTER — Encounter (HOSPITAL_COMMUNITY): Admission: EM | Disposition: A | Discharge status: Rehabilitation Facility | Payer: Self-pay | Source: Home / Self Care

## 2020-04-17 DIAGNOSIS — S72141A Displaced intertrochanteric fracture of right femur, initial encounter for closed fracture: Secondary | ICD-10-CM | POA: Diagnosis present

## 2020-04-17 DIAGNOSIS — D62 Acute posthemorrhagic anemia: Secondary | ICD-10-CM | POA: Diagnosis present

## 2020-04-17 DIAGNOSIS — J90 Pleural effusion, not elsewhere classified: Secondary | ICD-10-CM | POA: Diagnosis present

## 2020-04-17 DIAGNOSIS — Z91030 Bee allergy status: Secondary | ICD-10-CM

## 2020-04-17 DIAGNOSIS — E876 Hypokalemia: Secondary | ICD-10-CM | POA: Diagnosis present

## 2020-04-17 DIAGNOSIS — R1312 Dysphagia, oropharyngeal phase: Secondary | ICD-10-CM | POA: Diagnosis not present

## 2020-04-17 DIAGNOSIS — Z430 Encounter for attention to tracheostomy: Secondary | ICD-10-CM

## 2020-04-17 DIAGNOSIS — Z20822 Contact with and (suspected) exposure to covid-19: Secondary | ICD-10-CM | POA: Diagnosis present

## 2020-04-17 DIAGNOSIS — J939 Pneumothorax, unspecified: Secondary | ICD-10-CM

## 2020-04-17 DIAGNOSIS — L509 Urticaria, unspecified: Secondary | ICD-10-CM | POA: Diagnosis not present

## 2020-04-17 DIAGNOSIS — S0011XA Contusion of right eyelid and periocular area, initial encounter: Secondary | ICD-10-CM | POA: Diagnosis present

## 2020-04-17 DIAGNOSIS — S069X0D Unspecified intracranial injury without loss of consciousness, subsequent encounter: Secondary | ICD-10-CM | POA: Diagnosis not present

## 2020-04-17 DIAGNOSIS — F919 Conduct disorder, unspecified: Secondary | ICD-10-CM | POA: Diagnosis not present

## 2020-04-17 DIAGNOSIS — Z9911 Dependence on respirator [ventilator] status: Secondary | ICD-10-CM | POA: Diagnosis not present

## 2020-04-17 DIAGNOSIS — T1490XA Injury, unspecified, initial encounter: Secondary | ICD-10-CM | POA: Diagnosis present

## 2020-04-17 DIAGNOSIS — R000 Tachycardia, unspecified: Secondary | ICD-10-CM | POA: Diagnosis present

## 2020-04-17 DIAGNOSIS — T368X5A Adverse effect of other systemic antibiotics, initial encounter: Secondary | ICD-10-CM | POA: Diagnosis not present

## 2020-04-17 DIAGNOSIS — Z79899 Other long term (current) drug therapy: Secondary | ICD-10-CM

## 2020-04-17 DIAGNOSIS — J9811 Atelectasis: Secondary | ICD-10-CM | POA: Diagnosis present

## 2020-04-17 DIAGNOSIS — S066X9A Traumatic subarachnoid hemorrhage with loss of consciousness of unspecified duration, initial encounter: Secondary | ICD-10-CM | POA: Diagnosis present

## 2020-04-17 DIAGNOSIS — S022XXA Fracture of nasal bones, initial encounter for closed fracture: Secondary | ICD-10-CM | POA: Diagnosis present

## 2020-04-17 DIAGNOSIS — Z931 Gastrostomy status: Secondary | ICD-10-CM

## 2020-04-17 DIAGNOSIS — R195 Other fecal abnormalities: Secondary | ICD-10-CM | POA: Diagnosis not present

## 2020-04-17 DIAGNOSIS — S82201B Unspecified fracture of shaft of right tibia, initial encounter for open fracture type I or II: Secondary | ICD-10-CM | POA: Diagnosis present

## 2020-04-17 DIAGNOSIS — Z881 Allergy status to other antibiotic agents status: Secondary | ICD-10-CM

## 2020-04-17 DIAGNOSIS — F05 Delirium due to known physiological condition: Secondary | ICD-10-CM | POA: Diagnosis not present

## 2020-04-17 DIAGNOSIS — Z978 Presence of other specified devices: Secondary | ICD-10-CM

## 2020-04-17 DIAGNOSIS — Z23 Encounter for immunization: Secondary | ICD-10-CM

## 2020-04-17 DIAGNOSIS — T426X5A Adverse effect of other antiepileptic and sedative-hypnotic drugs, initial encounter: Secondary | ICD-10-CM | POA: Diagnosis not present

## 2020-04-17 DIAGNOSIS — J9601 Acute respiratory failure with hypoxia: Secondary | ICD-10-CM | POA: Diagnosis present

## 2020-04-17 DIAGNOSIS — S2242XA Multiple fractures of ribs, left side, initial encounter for closed fracture: Secondary | ICD-10-CM | POA: Diagnosis present

## 2020-04-17 DIAGNOSIS — J9690 Respiratory failure, unspecified, unspecified whether with hypoxia or hypercapnia: Secondary | ICD-10-CM

## 2020-04-17 DIAGNOSIS — Z9289 Personal history of other medical treatment: Secondary | ICD-10-CM

## 2020-04-17 DIAGNOSIS — S069X5S Unspecified intracranial injury with loss of consciousness greater than 24 hours with return to pre-existing conscious level, sequela: Secondary | ICD-10-CM | POA: Diagnosis not present

## 2020-04-17 DIAGNOSIS — S062X9A Diffuse traumatic brain injury with loss of consciousness of unspecified duration, initial encounter: Principal | ICD-10-CM | POA: Diagnosis present

## 2020-04-17 DIAGNOSIS — D181 Lymphangioma, any site: Secondary | ICD-10-CM | POA: Diagnosis present

## 2020-04-17 DIAGNOSIS — Y95 Nosocomial condition: Secondary | ICD-10-CM | POA: Diagnosis not present

## 2020-04-17 DIAGNOSIS — T361X5A Adverse effect of cephalosporins and other beta-lactam antibiotics, initial encounter: Secondary | ICD-10-CM | POA: Diagnosis not present

## 2020-04-17 DIAGNOSIS — G479 Sleep disorder, unspecified: Secondary | ICD-10-CM | POA: Diagnosis not present

## 2020-04-17 DIAGNOSIS — S82201S Unspecified fracture of shaft of right tibia, sequela: Secondary | ICD-10-CM | POA: Diagnosis not present

## 2020-04-17 DIAGNOSIS — I82409 Acute embolism and thrombosis of unspecified deep veins of unspecified lower extremity: Secondary | ICD-10-CM | POA: Diagnosis not present

## 2020-04-17 DIAGNOSIS — L8990 Pressure ulcer of unspecified site, unspecified stage: Secondary | ICD-10-CM | POA: Insufficient documentation

## 2020-04-17 DIAGNOSIS — S82401B Unspecified fracture of shaft of right fibula, initial encounter for open fracture type I or II: Secondary | ICD-10-CM | POA: Diagnosis present

## 2020-04-17 DIAGNOSIS — B958 Unspecified staphylococcus as the cause of diseases classified elsewhere: Secondary | ICD-10-CM | POA: Diagnosis not present

## 2020-04-17 DIAGNOSIS — S0285XA Fracture of orbit, unspecified, initial encounter for closed fracture: Secondary | ICD-10-CM | POA: Diagnosis present

## 2020-04-17 DIAGNOSIS — J189 Pneumonia, unspecified organism: Secondary | ICD-10-CM | POA: Diagnosis not present

## 2020-04-17 DIAGNOSIS — S069X0S Unspecified intracranial injury without loss of consciousness, sequela: Secondary | ICD-10-CM | POA: Diagnosis not present

## 2020-04-17 DIAGNOSIS — T148XXA Other injury of unspecified body region, initial encounter: Secondary | ICD-10-CM | POA: Diagnosis not present

## 2020-04-17 DIAGNOSIS — R1319 Other dysphagia: Secondary | ICD-10-CM | POA: Diagnosis not present

## 2020-04-17 DIAGNOSIS — S270XXA Traumatic pneumothorax, initial encounter: Secondary | ICD-10-CM | POA: Diagnosis present

## 2020-04-17 DIAGNOSIS — R40243 Glasgow coma scale score 3-8, unspecified time: Secondary | ICD-10-CM | POA: Diagnosis present

## 2020-04-17 DIAGNOSIS — H9190 Unspecified hearing loss, unspecified ear: Secondary | ICD-10-CM | POA: Diagnosis present

## 2020-04-17 DIAGNOSIS — Z4659 Encounter for fitting and adjustment of other gastrointestinal appliance and device: Secondary | ICD-10-CM

## 2020-04-17 DIAGNOSIS — F909 Attention-deficit hyperactivity disorder, unspecified type: Secondary | ICD-10-CM | POA: Diagnosis present

## 2020-04-17 DIAGNOSIS — S27322A Contusion of lung, bilateral, initial encounter: Secondary | ICD-10-CM | POA: Diagnosis present

## 2020-04-17 DIAGNOSIS — S36892A Contusion of other intra-abdominal organs, initial encounter: Secondary | ICD-10-CM | POA: Diagnosis present

## 2020-04-17 DIAGNOSIS — Z87820 Personal history of traumatic brain injury: Secondary | ICD-10-CM

## 2020-04-17 DIAGNOSIS — Z818 Family history of other mental and behavioral disorders: Secondary | ICD-10-CM

## 2020-04-17 DIAGNOSIS — S069X3S Unspecified intracranial injury with loss of consciousness of 1 hour to 5 hours 59 minutes, sequela: Secondary | ICD-10-CM | POA: Diagnosis not present

## 2020-04-17 DIAGNOSIS — I959 Hypotension, unspecified: Secondary | ICD-10-CM | POA: Diagnosis present

## 2020-04-17 DIAGNOSIS — S066X9D Traumatic subarachnoid hemorrhage with loss of consciousness of unspecified duration, subsequent encounter: Secondary | ICD-10-CM | POA: Diagnosis not present

## 2020-04-17 DIAGNOSIS — S82401S Unspecified fracture of shaft of right fibula, sequela: Secondary | ICD-10-CM | POA: Diagnosis not present

## 2020-04-17 DIAGNOSIS — R509 Fever, unspecified: Secondary | ICD-10-CM

## 2020-04-17 DIAGNOSIS — F32A Depression, unspecified: Secondary | ICD-10-CM | POA: Diagnosis present

## 2020-04-17 DIAGNOSIS — Z930 Tracheostomy status: Secondary | ICD-10-CM

## 2020-04-17 DIAGNOSIS — R451 Restlessness and agitation: Secondary | ICD-10-CM | POA: Diagnosis not present

## 2020-04-17 DIAGNOSIS — S0081XA Abrasion of other part of head, initial encounter: Secondary | ICD-10-CM | POA: Diagnosis present

## 2020-04-17 DIAGNOSIS — R7881 Bacteremia: Secondary | ICD-10-CM | POA: Diagnosis not present

## 2020-04-17 DIAGNOSIS — S36893A Laceration of other intra-abdominal organs, initial encounter: Secondary | ICD-10-CM | POA: Diagnosis present

## 2020-04-17 DIAGNOSIS — Z888 Allergy status to other drugs, medicaments and biological substances status: Secondary | ICD-10-CM

## 2020-04-17 DIAGNOSIS — F0781 Postconcussional syndrome: Secondary | ICD-10-CM | POA: Diagnosis present

## 2020-04-17 DIAGNOSIS — N390 Urinary tract infection, site not specified: Secondary | ICD-10-CM | POA: Diagnosis not present

## 2020-04-17 DIAGNOSIS — S7290XA Unspecified fracture of unspecified femur, initial encounter for closed fracture: Secondary | ICD-10-CM

## 2020-04-17 DIAGNOSIS — Z431 Encounter for attention to gastrostomy: Secondary | ICD-10-CM

## 2020-04-17 DIAGNOSIS — S72141D Displaced intertrochanteric fracture of right femur, subsequent encounter for closed fracture with routine healing: Secondary | ICD-10-CM | POA: Diagnosis not present

## 2020-04-17 HISTORY — DX: Depression, unspecified: F32.A

## 2020-04-17 HISTORY — DX: Attention-deficit hyperactivity disorder, unspecified type: F90.9

## 2020-04-17 HISTORY — PX: LAPAROTOMY: SHX154

## 2020-04-17 LAB — PROTIME-INR
INR: 1 (ref 0.8–1.2)
Prothrombin Time: 13.2 seconds (ref 11.4–15.2)

## 2020-04-17 LAB — LACTIC ACID, PLASMA: Lactic Acid, Venous: 2.8 mmol/L (ref 0.5–1.9)

## 2020-04-17 LAB — COMPREHENSIVE METABOLIC PANEL
ALT: 50 U/L — ABNORMAL HIGH (ref 0–44)
AST: 53 U/L — ABNORMAL HIGH (ref 15–41)
Albumin: 3.6 g/dL (ref 3.5–5.0)
Alkaline Phosphatase: 71 U/L (ref 38–126)
Anion gap: 11 (ref 5–15)
BUN: 20 mg/dL (ref 6–20)
CO2: 24 mmol/L (ref 22–32)
Calcium: 8.5 mg/dL — ABNORMAL LOW (ref 8.9–10.3)
Chloride: 104 mmol/L (ref 98–111)
Creatinine, Ser: 1.36 mg/dL — ABNORMAL HIGH (ref 0.61–1.24)
GFR, Estimated: >60 mL/min (ref >60)
Glucose, Bld: 151 mg/dL — ABNORMAL HIGH (ref 70–99)
Potassium: 3.4 mmol/L — ABNORMAL LOW (ref 3.5–5.1)
Sodium: 139 mmol/L (ref 135–145)
Total Bilirubin: 0.7 mg/dL (ref 0.3–1.2)
Total Protein: 6.2 g/dL — ABNORMAL LOW (ref 6.5–8.1)

## 2020-04-17 LAB — CBC
HCT: 40.2 % (ref 39.0–52.0)
Hemoglobin: 13.1 g/dL (ref 13.0–17.0)
MCH: 28.7 pg (ref 26.0–34.0)
MCHC: 32.6 g/dL (ref 30.0–36.0)
MCV: 88 fL (ref 80.0–100.0)
Platelets: 293 10*3/uL (ref 150–400)
RBC: 4.57 MIL/uL (ref 4.22–5.81)
RDW: 12.4 % (ref 11.5–15.5)
WBC: 9.9 10*3/uL (ref 4.0–10.5)
nRBC: 0 % (ref 0.0–0.2)

## 2020-04-17 LAB — I-STAT CHEM 8, ED
BUN: 22 mg/dL — ABNORMAL HIGH (ref 6–20)
Calcium, Ion: 1.06 mmol/L — ABNORMAL LOW (ref 1.15–1.40)
Chloride: 104 mmol/L (ref 98–111)
Creatinine, Ser: 1.3 mg/dL — ABNORMAL HIGH (ref 0.61–1.24)
Glucose, Bld: 150 mg/dL — ABNORMAL HIGH (ref 70–99)
HCT: 37 % — ABNORMAL LOW (ref 39.0–52.0)
Hemoglobin: 12.6 g/dL — ABNORMAL LOW (ref 13.0–17.0)
Potassium: 3.5 mmol/L (ref 3.5–5.1)
Sodium: 141 mmol/L (ref 135–145)
TCO2: 23 mmol/L (ref 22–32)

## 2020-04-17 LAB — I-STAT ARTERIAL BLOOD GAS, ED
Acid-base deficit: 2 mmol/L (ref 0.0–2.0)
Bicarbonate: 26.1 mmol/L (ref 20.0–28.0)
Calcium, Ion: 1.04 mmol/L — ABNORMAL LOW (ref 1.15–1.40)
HCT: 31 % — ABNORMAL LOW (ref 39.0–52.0)
Hemoglobin: 10.5 g/dL — ABNORMAL LOW (ref 13.0–17.0)
O2 Saturation: 100 %
Patient temperature: 98
Potassium: 3.5 mmol/L (ref 3.5–5.1)
Sodium: 141 mmol/L (ref 135–145)
TCO2: 28 mmol/L (ref 22–32)
pCO2 arterial: 59.4 mmHg — ABNORMAL HIGH (ref 32.0–48.0)
pH, Arterial: 7.248 — ABNORMAL LOW (ref 7.350–7.450)
pO2, Arterial: 308 mmHg — ABNORMAL HIGH (ref 83.0–108.0)

## 2020-04-17 LAB — URINALYSIS, ROUTINE W REFLEX MICROSCOPIC
Bacteria, UA: NONE SEEN
Bilirubin Urine: NEGATIVE
Glucose, UA: NEGATIVE mg/dL
Ketones, ur: NEGATIVE mg/dL
Leukocytes,Ua: NEGATIVE
Nitrite: NEGATIVE
Protein, ur: 30 mg/dL — AB
Specific Gravity, Urine: 1.031 — ABNORMAL HIGH (ref 1.005–1.030)
pH: 6 (ref 5.0–8.0)

## 2020-04-17 LAB — ABO/RH: ABO/RH(D): A POS

## 2020-04-17 LAB — ETHANOL: Alcohol, Ethyl (B): <10 mg/dL (ref <10)

## 2020-04-17 LAB — RESP PANEL BY RT-PCR (FLU A&B, COVID) ARPGX2
Influenza A by PCR: NEGATIVE
Influenza B by PCR: NEGATIVE
SARS Coronavirus 2 by RT PCR: NEGATIVE

## 2020-04-17 SURGERY — LAPAROTOMY, EXPLORATORY
Anesthesia: General

## 2020-04-17 MED ORDER — SODIUM CHLORIDE 0.9 % IV SOLN
INTRAVENOUS | Status: AC | PRN
  Administered 2020-04-17: 1000 mL via INTRAVENOUS

## 2020-04-17 MED ORDER — SODIUM CHLORIDE 0.9 % IV SOLN
INTRAVENOUS | Status: AC | PRN
  Administered 2020-04-17: 125 mL/h via INTRAVENOUS

## 2020-04-17 MED ORDER — CEFAZOLIN SODIUM-DEXTROSE 1-4 GM/50ML-% IV SOLN
INTRAVENOUS | Status: AC | PRN
  Administered 2020-04-17: 2 g via INTRAVENOUS

## 2020-04-17 MED ORDER — LEVETIRACETAM IN NACL 500 MG/100ML IV SOLN
500.0000 mg | Freq: Two times a day (BID) | INTRAVENOUS | Status: DC
  Administered 2020-04-18: 500 mg via INTRAVENOUS
  Filled 2020-04-17: qty 100

## 2020-04-17 MED ORDER — PROPOFOL 1000 MG/100ML IV EMUL
INTRAVENOUS | Status: AC | PRN
  Administered 2020-04-17: 5 ug via INTRAVENOUS

## 2020-04-17 MED ORDER — CEFAZOLIN SODIUM-DEXTROSE 1-4 GM/50ML-% IV SOLN: 1.0000 g | Freq: Once | INTRAVENOUS | Status: DC

## 2020-04-17 MED ORDER — ROCURONIUM BROMIDE 50 MG/5ML IV SOLN
INTRAVENOUS | Status: AC | PRN
  Administered 2020-04-17: 100 mg via INTRAVENOUS

## 2020-04-17 MED ORDER — FENTANYL CITRATE (PF) 250 MCG/5ML IJ SOLN
INTRAMUSCULAR | Status: AC
  Filled 2020-04-17: qty 5

## 2020-04-17 MED ORDER — 0.9 % SODIUM CHLORIDE (POUR BTL) OPTIME
TOPICAL | Status: DC | PRN
  Administered 2020-04-17: 1000 mL
  Administered 2020-04-18: 3000 mL

## 2020-04-17 MED ORDER — TETANUS-DIPHTH-ACELL PERTUSSIS 5-2.5-18.5 LF-MCG/0.5 IM SUSY
0.5000 mL | PREFILLED_SYRINGE | Freq: Once | INTRAMUSCULAR | Status: AC
  Administered 2020-04-17: 0.5 mL via INTRAMUSCULAR

## 2020-04-17 MED ORDER — LIDOCAINE HCL (PF) 1 % IJ SOLN
5.0000 mL | Freq: Once | INTRAMUSCULAR | Status: AC
  Administered 2020-04-17: 5 mL via INTRADERMAL
  Filled 2020-04-17: qty 5

## 2020-04-17 MED ORDER — MIDAZOLAM HCL 2 MG/2ML IJ SOLN
INTRAMUSCULAR | Status: AC
  Filled 2020-04-17: qty 2

## 2020-04-17 MED ORDER — FENTANYL CITRATE (PF) 100 MCG/2ML IJ SOLN
INTRAMUSCULAR | Status: AC
  Filled 2020-04-17: qty 2

## 2020-04-17 MED ORDER — FENTANYL CITRATE (PF) 100 MCG/2ML IJ SOLN
50.0000 ug | Freq: Once | INTRAMUSCULAR | Status: AC
  Administered 2020-04-17: 50 ug via INTRAVENOUS
  Filled 2020-04-17: qty 2

## 2020-04-17 MED ORDER — IOHEXOL 300 MG/ML  SOLN
100.0000 mL | Freq: Once | INTRAMUSCULAR | Status: AC | PRN
  Administered 2020-04-17: 100 mL via INTRAVENOUS

## 2020-04-17 MED ORDER — ETOMIDATE 2 MG/ML IV SOLN
INTRAVENOUS | Status: AC | PRN
  Administered 2020-04-17: 20 mg via INTRAVENOUS

## 2020-04-17 SURGICAL SUPPLY — 41 items
APL PRP STRL LF DISP 70% ISPRP (MISCELLANEOUS) ×1
BLADE CLIPPER SURG (BLADE) IMPLANT
CANISTER SUCT 3000ML PPV (MISCELLANEOUS) ×2 IMPLANT
CHLORAPREP W/TINT 26 (MISCELLANEOUS) ×2 IMPLANT
COVER SURGICAL LIGHT HANDLE (MISCELLANEOUS) ×2 IMPLANT
COVER WAND RF STERILE (DRAPES) ×2 IMPLANT
DRAPE LAPAROSCOPIC ABDOMINAL (DRAPES) ×2 IMPLANT
DRAPE WARM FLUID 44X44 (DRAPES) ×2 IMPLANT
DRSG OPSITE POSTOP 4X10 (GAUZE/BANDAGES/DRESSINGS) ×1 IMPLANT
DRSG OPSITE POSTOP 4X12 (GAUZE/BANDAGES/DRESSINGS) ×1 IMPLANT
DRSG OPSITE POSTOP 4X8 (GAUZE/BANDAGES/DRESSINGS) IMPLANT
ELECT BLADE 6.5 EXT (BLADE) IMPLANT
ELECT CAUTERY BLADE 6.4 (BLADE) ×2 IMPLANT
ELECT REM PT RETURN 9FT ADLT (ELECTROSURGICAL) ×2
ELECTRODE REM PT RTRN 9FT ADLT (ELECTROSURGICAL) ×1 IMPLANT
GLOVE BIOGEL M STRL SZ7.5 (GLOVE) ×2 IMPLANT
GLOVE INDICATOR 8.0 STRL GRN (GLOVE) ×4 IMPLANT
GOWN STRL REUS W/ TWL LRG LVL3 (GOWN DISPOSABLE) ×1 IMPLANT
GOWN STRL REUS W/TWL 2XL LVL3 (GOWN DISPOSABLE) ×2 IMPLANT
GOWN STRL REUS W/TWL LRG LVL3 (GOWN DISPOSABLE) ×2
HANDLE SUCTION POOLE (INSTRUMENTS) ×1 IMPLANT
KIT BASIN OR (CUSTOM PROCEDURE TRAY) ×2 IMPLANT
KIT TURNOVER KIT B (KITS) ×2 IMPLANT
LIGASURE IMPACT 36 18CM CVD LR (INSTRUMENTS) IMPLANT
NS IRRIG 1000ML POUR BTL (IV SOLUTION) ×6 IMPLANT
PACK GENERAL/GYN (CUSTOM PROCEDURE TRAY) ×2 IMPLANT
PAD ARMBOARD 7.5X6 YLW CONV (MISCELLANEOUS) ×2 IMPLANT
PENCIL SMOKE EVACUATOR (MISCELLANEOUS) ×2 IMPLANT
SPECIMEN JAR LARGE (MISCELLANEOUS) IMPLANT
SPONGE LAP 18X18 RF (DISPOSABLE) IMPLANT
STAPLER VISISTAT 35W (STAPLE) ×2 IMPLANT
SUCTION POOLE HANDLE (INSTRUMENTS) ×2
SUT PDS AB 1 TP1 96 (SUTURE) ×4 IMPLANT
SUT SILK 2 0 SH CR/8 (SUTURE) ×2 IMPLANT
SUT SILK 2 0 TIES 10X30 (SUTURE) ×2 IMPLANT
SUT SILK 3 0 SH CR/8 (SUTURE) ×2 IMPLANT
SUT SILK 3 0 TIES 10X30 (SUTURE) ×2 IMPLANT
SUT VIC AB 3-0 SH 18 (SUTURE) IMPLANT
TOWEL GREEN STERILE (TOWEL DISPOSABLE) ×2 IMPLANT
TRAY FOLEY MTR SLVR 16FR STAT (SET/KITS/TRAYS/PACK) IMPLANT
YANKAUER SUCT BULB TIP NO VENT (SUCTIONS) IMPLANT
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
## 2020-04-17 NOTE — Consult Note (Signed)
Responded to page, pt unavailable, no family present, please page again if need chaplain services.  Rev. Wm Sahagun A. Roque Cash
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
## 2020-04-17 NOTE — ED Notes (Signed)
2nd bag red blood cell started

## 2020-04-17 NOTE — ED Notes (Signed)
First unit Red blood cell started.

## 2020-04-17 NOTE — ED Provider Notes (Signed)
CHEST TUBE INSERTION  Date/Time: 04/17/2020 10:49 PM Performed by: Vanna Scotland, MD Authorized by: Greer Pickerel, MD   Consent:    Consent obtained:  Emergent situation Universal protocol:    Patient identity confirmed:  Arm band Pre-procedure details:    Skin preparation:  Chlorhexidine   Preparation: Patient was prepped and draped in the usual sterile fashion   Procedure details:    Placement location:  L lateral   Scalpel size:  11   Ultrasound guidance: no     Tension pneumothorax: no     Tube connected to:  Suction   Drainage characteristics:  Air only   Dressing:  4x4 sterile gauze Post-procedure details:    Post-insertion x-ray findings: tube in good position     Procedure completion:  Tolerated well, no immediate complications      Vanna Scotland, MD 39/71/41 0677    Floyd, [place], DO 05/05/20 1507

## 2020-04-17 NOTE — ED Notes (Signed)
Second unit completed.

## 2020-04-17 NOTE — ED Notes (Signed)
Patient transported to CT 

## 2020-04-17 NOTE — Progress Notes (Signed)
Orthopedic Tech Progress Note Patient Details:  Adam Bates Sep 08, 1970 [id number]  Ortho Devices Type of Ortho Device: Post (short leg) splint,Stirrup splint Ortho Device/Splint Location: rle. I applied a plaster posterior short leg and stirrup with the ortho P.A.. Ortho Device/Splint Interventions: Ordered,Application,Adjustment   Post Interventions Patient Tolerated: Well Instructions Provided: Care of device,Adjustment of device   Karolee Stamps 04/17/2020, 11:39 PM

## 2020-04-18 ENCOUNTER — Inpatient Hospital Stay (HOSPITAL_COMMUNITY): Payer: Managed Care, Other (non HMO) | Admitting: Certified Registered"

## 2020-04-18 ENCOUNTER — Inpatient Hospital Stay (HOSPITAL_COMMUNITY): Payer: Managed Care, Other (non HMO)

## 2020-04-18 ENCOUNTER — Encounter (HOSPITAL_COMMUNITY): Admission: EM | Disposition: A | Discharge status: Rehabilitation Facility | Payer: Self-pay | Source: Home / Self Care

## 2020-04-18 ENCOUNTER — Encounter (HOSPITAL_COMMUNITY): Payer: Self-pay

## 2020-04-18 DIAGNOSIS — S72141A Displaced intertrochanteric fracture of right femur, initial encounter for closed fracture: Secondary | ICD-10-CM

## 2020-04-18 DIAGNOSIS — S82201S Unspecified fracture of shaft of right tibia, sequela: Secondary | ICD-10-CM

## 2020-04-18 DIAGNOSIS — S82201B Unspecified fracture of shaft of right tibia, initial encounter for open fracture type I or II: Secondary | ICD-10-CM

## 2020-04-18 DIAGNOSIS — S82401S Unspecified fracture of shaft of right fibula, sequela: Secondary | ICD-10-CM

## 2020-04-18 HISTORY — PX: TIBIA IM NAIL INSERTION: SHX2516

## 2020-04-18 HISTORY — PX: FEMUR IM NAIL: SHX1597

## 2020-04-18 LAB — COMPREHENSIVE METABOLIC PANEL
ALT: 36 U/L (ref 0–44)
AST: 54 U/L — ABNORMAL HIGH (ref 15–41)
Albumin: 2.8 g/dL — ABNORMAL LOW (ref 3.5–5.0)
Alkaline Phosphatase: 43 U/L (ref 38–126)
Anion gap: 10 (ref 5–15)
BUN: 16 mg/dL (ref 6–20)
CO2: 23 mmol/L (ref 22–32)
Calcium: 7.6 mg/dL — ABNORMAL LOW (ref 8.9–10.3)
Chloride: 109 mmol/L (ref 98–111)
Creatinine, Ser: 0.97 mg/dL (ref 0.61–1.24)
GFR, Estimated: >60 mL/min (ref >60)
Glucose, Bld: 156 mg/dL — ABNORMAL HIGH (ref 70–99)
Potassium: 3.3 mmol/L — ABNORMAL LOW (ref 3.5–5.1)
Sodium: 142 mmol/L (ref 135–145)
Total Bilirubin: 1 mg/dL (ref 0.3–1.2)
Total Protein: 4.4 g/dL — ABNORMAL LOW (ref 6.5–8.1)

## 2020-04-18 LAB — POCT I-STAT 7, (LYTES, BLD GAS, ICA,H+H)
Acid-base deficit: 3 mmol/L — ABNORMAL HIGH (ref 0.0–2.0)
Acid-base deficit: 3 mmol/L — ABNORMAL HIGH (ref 0.0–2.0)
Bicarbonate: 24.3 mmol/L (ref 20.0–28.0)
Bicarbonate: 22.8 mmol/L (ref 20.0–28.0)
Calcium, Ion: 1.08 mmol/L — ABNORMAL LOW (ref 1.15–1.40)
Calcium, Ion: 1.11 mmol/L — ABNORMAL LOW (ref 1.15–1.40)
HCT: 31 % — ABNORMAL LOW (ref 39.0–52.0)
HCT: 25 % — ABNORMAL LOW (ref 39.0–52.0)
Hemoglobin: 10.5 g/dL — ABNORMAL LOW (ref 13.0–17.0)
Hemoglobin: 8.5 g/dL — ABNORMAL LOW (ref 13.0–17.0)
O2 Saturation: 100 %
O2 Saturation: 96 %
Patient temperature: 35.8
Potassium: 3.3 mmol/L — ABNORMAL LOW (ref 3.5–5.1)
Potassium: 4.6 mmol/L (ref 3.5–5.1)
Sodium: 143 mmol/L (ref 135–145)
Sodium: 141 mmol/L (ref 135–145)
TCO2: 26 mmol/L (ref 22–32)
TCO2: 24 mmol/L (ref 22–32)
pCO2 arterial: 49.1 mmHg — ABNORMAL HIGH (ref 32.0–48.0)
pCO2 arterial: 41.9 mmHg (ref 32.0–48.0)
pH, Arterial: 7.296 — ABNORMAL LOW (ref 7.350–7.450)
pH, Arterial: 7.344 — ABNORMAL LOW (ref 7.350–7.450)
pO2, Arterial: 379 mmHg — ABNORMAL HIGH (ref 83.0–108.0)
pO2, Arterial: 84 mmHg (ref 83.0–108.0)

## 2020-04-18 LAB — CBC
HCT: 32.4 % — ABNORMAL LOW (ref 39.0–52.0)
Hemoglobin: 10.9 g/dL — ABNORMAL LOW (ref 13.0–17.0)
MCH: 28.4 pg (ref 26.0–34.0)
MCHC: 33.6 g/dL (ref 30.0–36.0)
MCV: 84.4 fL (ref 80.0–100.0)
Platelets: 162 10*3/uL (ref 150–400)
RBC: 3.84 MIL/uL — ABNORMAL LOW (ref 4.22–5.81)
RDW: 13.6 % (ref 11.5–15.5)
WBC: 10.7 10*3/uL — ABNORMAL HIGH (ref 4.0–10.5)
nRBC: 0 % (ref 0.0–0.2)

## 2020-04-18 LAB — SURGICAL PCR SCREEN
MRSA, PCR: NEGATIVE
Staphylococcus aureus: POSITIVE — AB

## 2020-04-18 LAB — TRIGLYCERIDES: Triglycerides: 65 mg/dL (ref <150)

## 2020-04-18 LAB — GLUCOSE, CAPILLARY
Glucose-Capillary: 118 mg/dL — ABNORMAL HIGH (ref 70–99)
Glucose-Capillary: 131 mg/dL — ABNORMAL HIGH (ref 70–99)

## 2020-04-18 LAB — HIV ANTIBODY (ROUTINE TESTING W REFLEX): HIV Screen 4th Generation wRfx: NONREACTIVE

## 2020-04-18 LAB — BLOOD PRODUCT ORDER (VERBAL) VERIFICATION

## 2020-04-18 SURGERY — INSERTION, INTRAMEDULLARY ROD, FEMUR
Anesthesia: General | Site: Leg Upper | Laterality: Right

## 2020-04-18 MED ORDER — ROCURONIUM BROMIDE 10 MG/ML (PF) SYRINGE
PREFILLED_SYRINGE | INTRAVENOUS | Status: AC
  Filled 2020-04-18: qty 20

## 2020-04-18 MED ORDER — POLYETHYLENE GLYCOL 3350 17 G PO PACK
17.0000 g | PACK | Freq: Every day | ORAL | Status: DC
  Administered 2020-04-21 – 2020-04-24 (×4): 17 g
  Filled 2020-04-18 (×4): qty 1

## 2020-04-18 MED ORDER — ROCURONIUM 10MG/ML (10ML) SYRINGE FOR MEDFUSION PUMP - OPTIME
INTRAVENOUS | Status: DC | PRN
  Administered 2020-04-17 – 2020-04-18 (×2): 50 mg via INTRAVENOUS

## 2020-04-18 MED ORDER — FENTANYL CITRATE (PF) 250 MCG/5ML IJ SOLN
INTRAMUSCULAR | Status: DC | PRN
  Administered 2020-04-18: 50 ug via INTRAVENOUS

## 2020-04-18 MED ORDER — SODIUM CHLORIDE 0.9 % IV SOLN: INTRAVENOUS | Status: DC | PRN

## 2020-04-18 MED ORDER — PHENYLEPHRINE HCL-NACL 10-0.9 MG/250ML-% IV SOLN
0.0000 ug/min | INTRAVENOUS | Status: DC
  Administered 2020-04-19: 3 ug/min via INTRAVENOUS
  Administered 2020-04-19: 30 ug/min via INTRAVENOUS
  Filled 2020-04-18 (×3): qty 250

## 2020-04-18 MED ORDER — CHLORHEXIDINE GLUCONATE CLOTH 2 % EX PADS
6.0000 | MEDICATED_PAD | Freq: Every day | CUTANEOUS | Status: DC
  Administered 2020-04-19 (×2): 6 via TOPICAL

## 2020-04-18 MED ORDER — FENTANYL 2500MCG IN NS 250ML (10MCG/ML) PREMIX INFUSION
50.0000 ug/h | INTRAVENOUS | Status: AC
  Administered 2020-04-18: 50 ug/h via INTRAVENOUS
  Administered 2020-04-19: 55 ug/h via INTRAVENOUS
  Administered 2020-04-21: 100 ug/h via INTRAVENOUS
  Administered 2020-04-22: 150 ug/h via INTRAVENOUS
  Administered 2020-04-23: 200 ug/h via INTRAVENOUS
  Administered 2020-04-23 – 2020-04-24 (×2): 150 ug/h via INTRAVENOUS
  Administered 2020-04-25: 175 ug/h via INTRAVENOUS
  Administered 2020-04-25: 125 ug/h via INTRAVENOUS
  Administered 2020-04-26: 150 ug/h via INTRAVENOUS
  Filled 2020-04-18 (×10): qty 250

## 2020-04-18 MED ORDER — FENTANYL BOLUS VIA INFUSION
50.0000 ug | INTRAVENOUS | Status: AC | PRN
Start: 2020-04-18 — End: 2020-04-26
  Administered 2020-04-21 – 2020-04-24 (×17): 50 ug via INTRAVENOUS
  Filled 2020-04-18: qty 50

## 2020-04-18 MED ORDER — PROPOFOL 1000 MG/100ML IV EMUL
0.0000 ug/kg/min | INTRAVENOUS | Status: DC
  Administered 2020-04-18: 30 ug/kg/min via INTRAVENOUS
  Administered 2020-04-18: 20 ug/kg/min via INTRAVENOUS
  Administered 2020-04-18: 40 ug/kg/min via INTRAVENOUS
  Administered 2020-04-18: 20 ug/kg/min via INTRAVENOUS
  Administered 2020-04-19: 10 ug/kg/min via INTRAVENOUS
  Administered 2020-04-19: 35 ug/kg/min via INTRAVENOUS
  Administered 2020-04-19: 20 ug/kg/min via INTRAVENOUS
  Administered 2020-04-20 (×2): 10 ug/kg/min via INTRAVENOUS
  Administered 2020-04-20: 20 ug/kg/min via INTRAVENOUS
  Administered 2020-04-21 (×2): 10 ug/kg/min via INTRAVENOUS
  Filled 2020-04-18 (×12): qty 100

## 2020-04-18 MED ORDER — LACTATED RINGERS IV SOLN: INTRAVENOUS | Status: DC | PRN

## 2020-04-18 MED ORDER — MUPIROCIN 2 % EX OINT
TOPICAL_OINTMENT | CUTANEOUS | Status: AC
  Filled 2020-04-18: qty 22

## 2020-04-18 MED ORDER — PIVOT 1.5 CAL PO LIQD
1000.0000 mL | ORAL | Status: DC
  Administered 2020-04-18 – 2020-04-20 (×3): 1000 mL

## 2020-04-18 MED ORDER — MUPIROCIN 2 % EX OINT
1.0000 "application " | TOPICAL_OINTMENT | Freq: Two times a day (BID) | CUTANEOUS | Status: AC
  Administered 2020-04-18 – 2020-04-22 (×10): 1 via NASAL
  Filled 2020-04-18: qty 22

## 2020-04-18 MED ORDER — PROSOURCE TF PO LIQD
45.0000 mL | Freq: Two times a day (BID) | ORAL | Status: DC
  Administered 2020-04-18 – 2020-04-21 (×6): 45 mL
  Filled 2020-04-18 (×6): qty 45

## 2020-04-18 MED ORDER — CLINDAMYCIN PHOSPHATE 900 MG/50ML IV SOLN
INTRAVENOUS | Status: DC | PRN
  Administered 2020-04-18: 900 mg via INTRAVENOUS

## 2020-04-18 MED ORDER — PROPOFOL 500 MG/50ML IV EMUL
INTRAVENOUS | Status: DC | PRN
  Administered 2020-04-17: 10 ug/kg/min via INTRAVENOUS

## 2020-04-18 MED ORDER — CHLORHEXIDINE GLUCONATE 0.12% ORAL RINSE (MEDLINE KIT)
15.0000 mL | Freq: Two times a day (BID) | OROMUCOSAL | Status: DC
  Administered 2020-04-18 – 2020-05-01 (×27): 15 mL via OROMUCOSAL

## 2020-04-18 MED ORDER — HEPARIN SODIUM (PORCINE) 5000 UNIT/ML IJ SOLN
5000.0000 [IU] | Freq: Three times a day (TID) | INTRAMUSCULAR | Status: DC
  Administered 2020-04-19 – 2020-05-09 (×60): 5000 [IU] via SUBCUTANEOUS
  Filled 2020-04-18 (×61): qty 1

## 2020-04-18 MED ORDER — EPHEDRINE SULFATE 50 MG/ML IJ SOLN
INTRAMUSCULAR | Status: DC | PRN
  Administered 2020-04-18: 20 mg via INTRAVENOUS

## 2020-04-18 MED ORDER — ALBUMIN HUMAN 5 % IV SOLN: INTRAVENOUS | Status: DC | PRN

## 2020-04-18 MED ORDER — PHENYLEPHRINE HCL (PRESSORS) 10 MG/ML IV SOLN
INTRAVENOUS | Status: DC | PRN
  Administered 2020-04-18: 160 ug via INTRAVENOUS
  Administered 2020-04-18: 80 ug via INTRAVENOUS
  Administered 2020-04-18: 160 ug via INTRAVENOUS
  Administered 2020-04-18 (×2): 80 ug via INTRAVENOUS

## 2020-04-18 MED ORDER — PHENYLEPHRINE HCL-NACL 10-0.9 MG/250ML-% IV SOLN
INTRAVENOUS | Status: DC | PRN
  Administered 2020-04-18: 30 ug/min via INTRAVENOUS

## 2020-04-18 MED ORDER — DOCUSATE SODIUM 50 MG/5ML PO LIQD
100.0000 mg | Freq: Two times a day (BID) | ORAL | Status: DC
  Administered 2020-04-18 – 2020-04-24 (×11): 100 mg
  Filled 2020-04-18 (×12): qty 10

## 2020-04-18 MED ORDER — VANCOMYCIN HCL 1000 MG IV SOLR
INTRAVENOUS | Status: DC | PRN
  Administered 2020-04-18 (×2): 1000 mg

## 2020-04-18 MED ORDER — ORAL CARE MOUTH RINSE
15.0000 mL | OROMUCOSAL | Status: DC
  Administered 2020-04-18 – 2020-05-01 (×128): 15 mL via OROMUCOSAL

## 2020-04-18 MED ORDER — SODIUM CHLORIDE 0.9 % IV SOLN: 2.0000 g | INTRAVENOUS | Status: DC

## 2020-04-18 MED ORDER — ROCURONIUM BROMIDE 10 MG/ML (PF) SYRINGE
PREFILLED_SYRINGE | INTRAVENOUS | Status: DC | PRN
  Administered 2020-04-18 (×2): 50 mg via INTRAVENOUS
  Administered 2020-04-18: 100 mg via INTRAVENOUS

## 2020-04-18 MED ORDER — SODIUM CHLORIDE 0.9 % IV SOLN
2.0000 g | Freq: Three times a day (TID) | INTRAVENOUS | Status: AC
  Administered 2020-04-18 – 2020-04-21 (×9): 2 g via INTRAVENOUS
  Filled 2020-04-18 (×9): qty 2

## 2020-04-18 MED ORDER — CHLORHEXIDINE GLUCONATE CLOTH 2 % EX PADS
6.0000 | MEDICATED_PAD | Freq: Every day | CUTANEOUS | Status: DC
  Administered 2020-04-18 – 2020-04-19 (×2): 6 via TOPICAL

## 2020-04-18 MED ORDER — SODIUM CHLORIDE 0.9 % IR SOLN
Status: DC | PRN
  Administered 2020-04-18: 3000 mL
  Administered 2020-04-18: 1000 mL

## 2020-04-18 MED ORDER — PANTOPRAZOLE SODIUM 40 MG PO TBEC
40.0000 mg | DELAYED_RELEASE_TABLET | Freq: Every day | ORAL | Status: DC
  Filled 2020-04-18 (×2): qty 1

## 2020-04-18 MED ORDER — PROPOFOL 10 MG/ML IV BOLUS
INTRAVENOUS | Status: AC
  Filled 2020-04-18: qty 20

## 2020-04-18 MED ORDER — VITAL HIGH PROTEIN PO LIQD: 1000.0000 mL | ORAL | Status: DC

## 2020-04-18 MED ORDER — CEFAZOLIN SODIUM-DEXTROSE 2-4 GM/100ML-% IV SOLN: 2.0000 g | Freq: Three times a day (TID) | INTRAVENOUS | Status: DC

## 2020-04-18 MED ORDER — DIPHENHYDRAMINE HCL 50 MG/ML IJ SOLN
50.0000 mg | Freq: Once | INTRAMUSCULAR | Status: AC
  Administered 2020-04-18: 50 mg via INTRAVENOUS
  Filled 2020-04-18: qty 1

## 2020-04-18 MED ORDER — CLINDAMYCIN PHOSPHATE 900 MG/50ML IV SOLN
900.0000 mg | Freq: Three times a day (TID) | INTRAVENOUS | Status: AC
  Administered 2020-04-18 – 2020-04-21 (×9): 900 mg via INTRAVENOUS
  Filled 2020-04-18 (×9): qty 50

## 2020-04-18 MED ORDER — FENTANYL CITRATE (PF) 250 MCG/5ML IJ SOLN
INTRAMUSCULAR | Status: AC
  Filled 2020-04-18: qty 5

## 2020-04-18 MED ORDER — VANCOMYCIN HCL 1000 MG IV SOLR
INTRAVENOUS | Status: AC
  Filled 2020-04-18: qty 1000

## 2020-04-18 MED ORDER — PHENYLEPHRINE HCL-NACL 10-0.9 MG/250ML-% IV SOLN
INTRAVENOUS | Status: DC | PRN
  Administered 2020-04-18: 20 ug/min via INTRAVENOUS

## 2020-04-18 MED ORDER — 0.9 % SODIUM CHLORIDE (POUR BTL) OPTIME
TOPICAL | Status: DC | PRN
  Administered 2020-04-18: 1000 mL

## 2020-04-18 MED ORDER — MIDAZOLAM HCL 2 MG/2ML IJ SOLN
INTRAMUSCULAR | Status: AC
  Filled 2020-04-18: qty 2

## 2020-04-18 MED ORDER — POTASSIUM CHLORIDE 10 MEQ/50ML IV SOLN
10.0000 meq | INTRAVENOUS | Status: AC
  Administered 2020-04-18 (×5): 10 meq via INTRAVENOUS
  Filled 2020-04-18 (×2): qty 50

## 2020-04-18 MED ORDER — ONDANSETRON HCL 4 MG/2ML IJ SOLN
4.0000 mg | Freq: Four times a day (QID) | INTRAMUSCULAR | Status: DC | PRN
  Administered 2020-04-29 – 2020-05-04 (×2): 4 mg via INTRAVENOUS
  Filled 2020-04-18 (×2): qty 2

## 2020-04-18 MED ORDER — LEVOFLOXACIN IN D5W 750 MG/150ML IV SOLN
750.0000 mg | INTRAVENOUS | Status: DC
  Administered 2020-04-18 – 2020-04-19 (×2): 750 mg via INTRAVENOUS
  Filled 2020-04-18 (×2): qty 150

## 2020-04-18 MED ORDER — FENTANYL CITRATE (PF) 100 MCG/2ML IJ SOLN
50.0000 ug | Freq: Once | INTRAMUSCULAR | Status: AC
  Administered 2020-04-18: 50 ug via INTRAVENOUS

## 2020-04-18 MED ORDER — POTASSIUM CHLORIDE IN NACL 20-0.9 MEQ/L-% IV SOLN
INTRAVENOUS | Status: DC
  Filled 2020-04-18 (×11): qty 1000

## 2020-04-18 MED ORDER — PANTOPRAZOLE SODIUM 40 MG IV SOLR
40.0000 mg | Freq: Every day | INTRAVENOUS | Status: DC
  Administered 2020-04-18 – 2020-04-28 (×11): 40 mg via INTRAVENOUS
  Filled 2020-04-18 (×11): qty 40

## 2020-04-18 MED ORDER — DIPHENHYDRAMINE HCL 50 MG/ML IJ SOLN
25.0000 mg | Freq: Four times a day (QID) | INTRAMUSCULAR | Status: DC | PRN
  Administered 2020-04-26 – 2020-04-27 (×2): 25 mg via INTRAVENOUS
  Filled 2020-04-18 (×5): qty 1

## 2020-04-18 MED ORDER — CLINDAMYCIN PHOSPHATE 900 MG/50ML IV SOLN
INTRAVENOUS | Status: AC
  Filled 2020-04-18: qty 50

## 2020-04-18 MED ORDER — ONDANSETRON 4 MG PO TBDP: 4.0000 mg | ORAL_TABLET | Freq: Four times a day (QID) | ORAL | Status: DC | PRN

## 2020-04-18 MED ORDER — POTASSIUM CHLORIDE 20 MEQ PO PACK: 40.0000 meq | PACK | Freq: Once | ORAL | Status: DC

## 2020-04-18 MED ORDER — MIDAZOLAM HCL 2 MG/2ML IJ SOLN
INTRAMUSCULAR | Status: DC | PRN
  Administered 2020-04-18: 4 mg via INTRAVENOUS

## 2020-04-18 SURGICAL SUPPLY — 92 items
APL PRP STRL LF DISP 70% ISPRP (MISCELLANEOUS) ×4
BIT DRILL CAL 3.2 LONG (BIT) ×1 IMPLANT
BIT DRILL CANN LRG QC 5X300 (BIT) ×1 IMPLANT
BIT DRILL SHORT 3.2MM (DRILL) IMPLANT
BIT DRILL SHORT 4.2 (BIT) IMPLANT
BLADE SURG 10 STRL SS (BLADE) ×5 IMPLANT
BNDG CMPR MED 15X6 ELC VLCR LF (GAUZE/BANDAGES/DRESSINGS) ×2
BNDG COHESIVE 4X5 TAN STRL (GAUZE/BANDAGES/DRESSINGS) ×2 IMPLANT
BNDG COHESIVE 6X5 TAN STRL LF (GAUZE/BANDAGES/DRESSINGS) ×1 IMPLANT
BNDG ELASTIC 4X5.8 VLCR STR LF (GAUZE/BANDAGES/DRESSINGS) ×2 IMPLANT
BNDG ELASTIC 6X15 VLCR STRL LF (GAUZE/BANDAGES/DRESSINGS) ×1 IMPLANT
BNDG ELASTIC 6X5.8 VLCR STR LF (GAUZE/BANDAGES/DRESSINGS) ×2 IMPLANT
BNDG GAUZE ELAST 4 BULKY (GAUZE/BANDAGES/DRESSINGS) ×2 IMPLANT
BRUSH SCRUB EZ PLAIN DRY (MISCELLANEOUS) ×6 IMPLANT
CANISTER WOUNDNEG PRESSURE 500 (CANNISTER) ×1 IMPLANT
CHLORAPREP W/TINT 26 (MISCELLANEOUS) ×4 IMPLANT
COVER SURGICAL LIGHT HANDLE (MISCELLANEOUS) ×5 IMPLANT
COVER WAND RF STERILE (DRAPES) ×2 IMPLANT
DRAPE C-ARM 35X43 STRL (DRAPES) ×3 IMPLANT
DRAPE C-ARM 42X72 X-RAY (DRAPES) ×3 IMPLANT
DRAPE C-ARMOR (DRAPES) ×3 IMPLANT
DRAPE HALF SHEET 40X57 (DRAPES) ×6 IMPLANT
DRAPE IMP U-DRAPE 54X76 (DRAPES) ×6 IMPLANT
DRAPE INCISE IOBAN 66X45 STRL (DRAPES) ×3 IMPLANT
DRAPE ORTHO SPLIT 77X108 STRL (DRAPES) ×6
DRAPE SURG 17X23 STRL (DRAPES) ×3 IMPLANT
DRAPE SURG ORHT 6 SPLT 77X108 (DRAPES) ×4 IMPLANT
DRAPE U-SHAPE 47X51 STRL (DRAPES) ×3 IMPLANT
DRILL BIT SHORT 4.2 (BIT) ×6
DRILL SHORT 3.2MM (DRILL) ×6
DRSG ADAPTIC 3X8 NADH LF (GAUZE/BANDAGES/DRESSINGS) ×3 IMPLANT
DRSG MEPILEX BORD LITE 2X5 (GAUZE/BANDAGES/DRESSINGS) IMPLANT
DRSG MEPILEX BORDER 4X4 (GAUZE/BANDAGES/DRESSINGS) ×11 IMPLANT
DRSG MEPILEX BORDER 4X8 (GAUZE/BANDAGES/DRESSINGS) ×4 IMPLANT
DRSG MEPITEL 3X4 ME34 (GAUZE/BANDAGES/DRESSINGS) ×5 IMPLANT
DRSG MEPITEL 4X7.2 (GAUZE/BANDAGES/DRESSINGS) ×1 IMPLANT
ELECT REM PT RETURN 9FT ADLT (ELECTROSURGICAL) ×3
ELECTRODE REM PT RTRN 9FT ADLT (ELECTROSURGICAL) ×2 IMPLANT
GAUZE SPONGE 4X4 12PLY STRL (GAUZE/BANDAGES/DRESSINGS) ×3 IMPLANT
GLOVE BIO SURGEON STRL SZ 6.5 (GLOVE) ×9 IMPLANT
GLOVE BIO SURGEON STRL SZ7.5 (GLOVE) ×12 IMPLANT
GLOVE BIOGEL PI IND STRL 6.5 (GLOVE) ×2 IMPLANT
GLOVE BIOGEL PI IND STRL 7.5 (GLOVE) ×2 IMPLANT
GLOVE BIOGEL PI INDICATOR 6.5 (GLOVE) ×1
GLOVE BIOGEL PI INDICATOR 7.5 (GLOVE) ×1
GOWN STRL REUS W/ TWL LRG LVL3 (GOWN DISPOSABLE) ×6 IMPLANT
GOWN STRL REUS W/ TWL XL LVL3 (GOWN DISPOSABLE) ×2 IMPLANT
GOWN STRL REUS W/TWL LRG LVL3 (GOWN DISPOSABLE) ×9
GOWN STRL REUS W/TWL XL LVL3 (GOWN DISPOSABLE) ×3
GUIDEWIRE 3.2X400 (WIRE) ×2 IMPLANT
GUIDEWIRE THREADED 2.8 (WIRE) ×2 IMPLANT
KIT BASIN OR (CUSTOM PROCEDURE TRAY) ×3 IMPLANT
KIT PREVENA INCISION MGT 13 (CANNISTER) ×1 IMPLANT
KIT TURNOVER KIT B (KITS) ×3 IMPLANT
MANIFOLD NEPTUNE II (INSTRUMENTS) ×3 IMPLANT
NAIL CANN FENS 9X400 RT (Nail) ×1 IMPLANT
NAIL TIBAL EX 9X360MM (Nail) ×1 IMPLANT
NS IRRIG 1000ML POUR BTL (IV SOLUTION) ×3 IMPLANT
PACK GENERAL/GYN (CUSTOM PROCEDURE TRAY) ×3 IMPLANT
PACK TOTAL JOINT (CUSTOM PROCEDURE TRAY) ×3 IMPLANT
PAD ARMBOARD 7.5X6 YLW CONV (MISCELLANEOUS) ×6 IMPLANT
PAD CAST 4YDX4 CTTN HI CHSV (CAST SUPPLIES) IMPLANT
PADDING CAST COTTON 4X4 STRL (CAST SUPPLIES) ×6
PADDING CAST COTTON 6X4 STRL (CAST SUPPLIES) ×2 IMPLANT
PIN STEINMAN 5/64X9 TRO PT NS (PIN) ×1 IMPLANT
REAMER ROD DEEP FLUTE 2.5X950 (INSTRUMENTS) ×1 IMPLANT
SCREW 6.5MM W/T25 STARDRIVE (Screw) ×1 IMPLANT
SCREW CANN TI 32 THRD 6.5X100 (Screw) ×1 IMPLANT
SCREW CANN TI 32 THRD 6.5X95 (Screw) ×1 IMPLANT
SCREW LOCK 4X36 TI (Screw) ×1 IMPLANT
SCREW LOCK 4X80 TI (Screw) ×1 IMPLANT
SCREW LOCK STAR 5X50 (Screw) ×1 IMPLANT
SCREW LOCK STAR 5X54 (Screw) ×1 IMPLANT
SCREW LOCK TI 4X42 (Screw) ×1 IMPLANT
SCREW LOCKING 4.0 36MM (Screw) ×1 IMPLANT
SCREW RECON 6.5X105MM (Screw) ×1 IMPLANT
SET INTERPULSE LAVAGE W/TIP (ORTHOPEDIC DISPOSABLE SUPPLIES) ×1 IMPLANT
STAPLER VISISTAT 35W (STAPLE) ×3 IMPLANT
STOCKINETTE IMPERVIOUS LG (DRAPES) ×3 IMPLANT
SUT ETHILON 2 0 FS 18 (SUTURE) ×2 IMPLANT
SUT ETHILON 3 0 PS 1 (SUTURE) ×6 IMPLANT
SUT MNCRL AB 3-0 PS2 18 (SUTURE) ×5 IMPLANT
SUT MNCRL AB 3-0 PS2 27 (SUTURE) ×1 IMPLANT
SUT VIC AB 0 CT1 27 (SUTURE) ×12
SUT VIC AB 0 CT1 27XBRD ANBCTR (SUTURE) IMPLANT
SUT VIC AB 2-0 CT1 27 (SUTURE) ×12
SUT VIC AB 2-0 CT1 TAPERPNT 27 (SUTURE) ×4 IMPLANT
TOWEL GREEN STERILE (TOWEL DISPOSABLE) ×6 IMPLANT
TOWEL GREEN STERILE FF (TOWEL DISPOSABLE) ×3 IMPLANT
UNDERPAD 30X36 HEAVY ABSORB (UNDERPADS AND DIAPERS) ×3 IMPLANT
WATER STERILE IRR 1000ML POUR (IV SOLUTION) ×3 IMPLANT
YANKAUER SUCT BULB TIP NO VENT (SUCTIONS) IMPLANT

## 2020-04-18 NOTE — Anesthesia Procedure Notes (Signed)
Date/Time: 04/18/2020 2:20 PM Performed by: Lance Coon, CRNA Pre-anesthesia Checklist: Patient identified, Emergency Drugs available, Suction available, Patient being monitored and Timeout performed Patient Re-evaluated:Patient Re-evaluated prior to induction Oxygen Delivery Method: Circle system utilized Preoxygenation: Pre-oxygenation with 100% oxygen Induction Type: Inhalational induction Comments: Indwelling ETT from ICU

## 2020-04-18 NOTE — Progress Notes (Signed)
Pt transported from Trauma B to OR without complication. RT will continue to monitor.

## 2020-04-18 NOTE — Anesthesia Procedure Notes (Signed)
Central Venous Catheter Insertion Performed by: Roberts Gaudy, MD Start/End12/17/2021 12:10 AM, 04/18/2020 12:15 AM Preanesthetic checklist: patient identified and IV checked Position: Trendelenburg Hand hygiene performed , maximum sterile barriers used  and Seldinger technique used Catheter size: 12 Fr Total catheter length 15. Central line was placed.Triple lumen Procedure performed without using ultrasound guided technique. Attempts: 1 Following insertion, line sutured, dressing applied and Biopatch. Post procedure assessment: blood return through all ports, free fluid flow and no air  Patient tolerated the procedure well with no immediate complications.

## 2020-04-18 NOTE — Progress Notes (Signed)
Trauma/Critical Care Follow Up Note  Subjective:    Overnight Issues:   Objective:  Vital signs for last 24 hours: Temp:  [95.4 F (35.2 C)-98.6 F (37 C)] 98.6 F (37 C) (12/17 0756) Pulse Rate:  [73-147] 96 (12/17 0900) Resp:  [15-28] 21 (12/17 0900) BP: (93-135)/(60-94) 104/78 (12/17 0900) SpO2:  [88 %-100 %] 98 % (12/17 0900) Arterial Line BP: (98-126)/(53-75) 120/71 (12/17 0900) FiO2 (%):  [40 %-60 %] 40 % (12/17 0900) Weight:  [117.9 kg] 117.9 kg (12/16 2204)  Hemodynamic parameters for last 24 hours:    Intake/Output from previous day: 12/16 0701 - 12/17 0700 In: 11466 [I.V.:8823.6; UVOZD:6644; IV Piggyback:698.3] Out: 1630 [Urine:1550; Blood:50; Chest Tube:30]  Intake/Output this shift: Total I/O In: 322.7 [I.V.:121.1; IV Piggyback:201.6] Out: -   Vent settings for last 24 hours: Vent Mode: PRVC FiO2 (%):  [40 %-60 %] 40 % Set Rate:  [16 bmp-20 bmp] 20 bmp Vt Set:  [510 mL] 510 mL PEEP:  [5 cmH20] 5 cmH20 Plateau Pressure:  [16 cmH20-24 cmH20] 24 cmH20  Physical Exam:  Gen: comfortable, no distress Neuro: grossly non-focal, does not follow commands but does flex LUE to noxious stimulus, per NSGY eval when off sedation pt moves extr x4 HEENT: intubated Neck: c-collar in place CV: RRR Pulm: unlabored breathing, mechanically ventilated Abd: soft, nontender GU: clear, yellow urine Extr: wwp, no edema   Results for orders placed or performed during the hospital encounter of 04/17/20 (from the past 24 hour(s))  I-Stat Chem 8, ED     Status: Abnormal   Collection Time: 04/17/20  9:09 PM  Result Value Ref Range   Sodium 141 135 - 145 mmol/L   Potassium 3.5 3.5 - 5.1 mmol/L   Chloride 104 98 - 111 mmol/L   BUN 22 (H) 6 - 20 mg/dL   Creatinine, Ser 1.30 (H) 0.61 - 1.24 mg/dL   Glucose, Bld 150 (H) 70 - 99 mg/dL   Calcium, Ion 1.06 (L) 1.15 - 1.40 mmol/L   TCO2 23 22 - 32 mmol/L   Hemoglobin 12.6 (L) 13.0 - 17.0 g/dL   HCT 37.0 (L) 39.0 - 52.0 %   Comprehensive metabolic panel     Status: Abnormal   Collection Time: 04/17/20  9:10 PM  Result Value Ref Range   Sodium 139 135 - 145 mmol/L   Potassium 3.4 (L) 3.5 - 5.1 mmol/L   Chloride 104 98 - 111 mmol/L   CO2 24 22 - 32 mmol/L   Glucose, Bld 151 (H) 70 - 99 mg/dL   BUN 20 6 - 20 mg/dL   Creatinine, Ser 1.36 (H) 0.61 - 1.24 mg/dL   Calcium 8.5 (L) 8.9 - 10.3 mg/dL   Total Protein 6.2 (L) 6.5 - 8.1 g/dL   Albumin 3.6 3.5 - 5.0 g/dL   AST 53 (H) 15 - 41 U/L   ALT 50 (H) 0 - 44 U/L   Alkaline Phosphatase 71 38 - 126 U/L   Total Bilirubin 0.7 0.3 - 1.2 mg/dL   GFR, Estimated >60 >60 mL/min   Anion gap 11 5 - 15  CBC     Status: None   Collection Time: 04/17/20  9:10 PM  Result Value Ref Range   WBC 9.9 4.0 - 10.5 K/uL   RBC 4.57 4.22 - 5.81 MIL/uL   Hemoglobin 13.1 13.0 - 17.0 g/dL   HCT 40.2 39.0 - 52.0 %   MCV 88.0 80.0 - 100.0 fL   MCH 28.7 26.0 -  34.0 pg   MCHC 32.6 30.0 - 36.0 g/dL   RDW 12.4 11.5 - 15.5 %   Platelets 293 150 - 400 K/uL   nRBC 0.0 0.0 - 0.2 %  Lactic acid, plasma     Status: Abnormal   Collection Time: 04/17/20  9:10 PM  Result Value Ref Range   Lactic Acid, Venous 2.8 (HH) 0.5 - 1.9 mmol/L  Protime-INR     Status: None   Collection Time: 04/17/20  9:10 PM  Result Value Ref Range   Prothrombin Time 13.2 11.4 - 15.2 seconds   INR 1.0 0.8 - 1.2  Type and screen Ordered by PROVIDER DEFAULT     Status: None (Preliminary result)   Collection Time: 04/17/20  9:10 PM  Result Value Ref Range   ABO/RH(D) A POS    Antibody Screen NEG    Sample Expiration 04/20/2020,2359    Unit Number [id number]    Blood Component Type RED CELLS,LR    Unit division 00    Status of Unit ISSUED    Unit tag comment EMERGENCY RELEASE    Transfusion Status OK TO TRANSFUSE    Crossmatch Result COMPATIBLE    Unit Number [id number]    Blood Component Type RED CELLS,LR    Unit division 00    Status of Unit ISSUED    Unit tag comment EMERGENCY RELEASE     Transfusion Status OK TO TRANSFUSE    Crossmatch Result COMPATIBLE    Unit Number [id number]    Blood Component Type RED CELLS,LR    Unit division 00    Status of Unit ALLOCATED    Transfusion Status OK TO TRANSFUSE    Crossmatch Result Compatible    Unit Number [id number]    Blood Component Type RED CELLS,LR    Unit division 00    Status of Unit ALLOCATED    Transfusion Status OK TO TRANSFUSE    Crossmatch Result      Compatible Performed at [facility], [address] [place], [place] [address]    Unit Number [id number]    Blood Component Type RBC LR PHER1    Unit division 00    Status of Unit ISSUED    Transfusion Status OK TO TRANSFUSE    Crossmatch Result Compatible    Unit Number [id number]    Blood Component Type RED CELLS,LR    Unit division 00    Status of Unit ISSUED    Transfusion Status OK TO TRANSFUSE    Crossmatch Result Compatible   Ethanol     Status: None   Collection Time: 04/17/20  9:16 PM  Result Value Ref Range   Alcohol, Ethyl (B) <10 <10 mg/dL  I-Stat arterial blood gas, ED     Status: Abnormal   Collection Time: 04/17/20  9:49 PM  Result Value Ref Range   pH, Arterial 7.248 (L) 7.350 - 7.450   pCO2 arterial 59.4 (H) 32.0 - 48.0 mmHg   pO2, Arterial 308 (H) 83.0 - 108.0 mmHg   Bicarbonate 26.1 20.0 - 28.0 mmol/L   TCO2 28 22 - 32 mmol/L   O2 Saturation 100.0 %   Acid-base deficit 2.0 0.0 - 2.0 mmol/L   Sodium 141 135 - 145 mmol/L   Potassium 3.5 3.5 - 5.1 mmol/L   Calcium, Ion 1.04 (L) 1.15 - 1.40 mmol/L   HCT 31.0 (L) 39.0 - 52.0 %   Hemoglobin 10.5 (L) 13.0 - 17.0 g/dL   Patient temperature 98.0 F  Collection site Radial    Drawn by RT    Sample type ARTERIAL   Urinalysis, Routine w reflex microscopic Urine, Catheterized     Status: Abnormal   Collection Time: 04/17/20  9:52 PM  Result Value Ref Range   Color, Urine YELLOW YELLOW   APPearance CLEAR CLEAR   Specific Gravity, Urine 1.031 (H) 1.005 - 1.030   pH 6.0  5.0 - 8.0   Glucose, UA NEGATIVE NEGATIVE mg/dL   Hgb urine dipstick MODERATE (A) NEGATIVE   Bilirubin Urine NEGATIVE NEGATIVE   Ketones, ur NEGATIVE NEGATIVE mg/dL   Protein, ur 30 (A) NEGATIVE mg/dL   Nitrite NEGATIVE NEGATIVE   Leukocytes,Ua NEGATIVE NEGATIVE   RBC / HPF 21-50 0 - 5 RBC/hpf   WBC, UA 0-5 0 - 5 WBC/hpf   Bacteria, UA NONE SEEN NONE SEEN   Squamous Epithelial / LPF 0-5 0 - 5   Hyaline Casts, UA PRESENT   Resp Panel by RT-PCR (Flu A&B, Covid) Nasopharyngeal Swab     Status: None   Collection Time: 04/17/20  9:57 PM   Specimen: Nasopharyngeal Swab; Nasopharyngeal(NP) swabs in vial transport medium  Result Value Ref Range   SARS Coronavirus 2 by RT PCR NEGATIVE NEGATIVE   Influenza A by PCR NEGATIVE NEGATIVE   Influenza B by PCR NEGATIVE NEGATIVE  ABO/Rh     Status: None   Collection Time: 04/17/20 10:13 PM  Result Value Ref Range   ABO/RH(D)      A POS Performed at [facility], [address], [place], [place] [address]   I-STAT 7, (LYTES, BLD GAS, ICA, H+H)     Status: Abnormal   Collection Time: 04/18/20 12:19 AM  Result Value Ref Range   pH, Arterial 7.296 (L) 7.350 - 7.450   pCO2 arterial 49.1 (H) 32.0 - 48.0 mmHg   pO2, Arterial 379 (H) 83.0 - 108.0 mmHg   Bicarbonate 24.3 20.0 - 28.0 mmol/L   TCO2 26 22 - 32 mmol/L   O2 Saturation 100.0 %   Acid-base deficit 3.0 (H) 0.0 - 2.0 mmol/L   Sodium 143 135 - 145 mmol/L   Potassium 3.3 (L) 3.5 - 5.1 mmol/L   Calcium, Ion 1.08 (L) 1.15 - 1.40 mmol/L   HCT 31.0 (L) 39.0 - 52.0 %   Hemoglobin 10.5 (L) 13.0 - 17.0 g/dL   Patient temperature 35.8 C    Sample type ARTERIAL   Prepare fresh frozen plasma     Status: None (Preliminary result)   Collection Time: 04/18/20 12:48 AM  Result Value Ref Range   Unit Number [id number]    Blood Component Type THAWED PLASMA    Unit division 00    Status of Unit ISSUED    Transfusion Status      OK TO TRANSFUSE Performed at [facility], [address] [place], [place] [address]    Unit Number [id number]    Blood Component Type THAWED PLASMA    Unit division 00    Status of Unit ISSUED    Transfusion Status OK TO TRANSFUSE   HIV Antibody (routine testing w rflx)     Status: None   Collection Time: 04/18/20  2:28 AM  Result Value Ref Range   HIV Screen 4th Generation wRfx Non Reactive Non Reactive  CBC     Status: Abnormal   Collection Time: 04/18/20  2:28 AM  Result Value Ref Range   WBC 10.7 (H) 4.0 - 10.5 K/uL  RBC 3.84 (L) 4.22 - 5.81 MIL/uL   Hemoglobin 10.9 (L) 13.0 - 17.0 g/dL   HCT 32.4 (L) 39.0 - 52.0 %   MCV 84.4 80.0 - 100.0 fL   MCH 28.4 26.0 - 34.0 pg   MCHC 33.6 30.0 - 36.0 g/dL   RDW 13.6 11.5 - 15.5 %   Platelets 162 150 - 400 K/uL   nRBC 0.0 0.0 - 0.2 %  Comprehensive metabolic panel     Status: Abnormal   Collection Time: 04/18/20  2:28 AM  Result Value Ref Range   Sodium 142 135 - 145 mmol/L   Potassium 3.3 (L) 3.5 - 5.1 mmol/L   Chloride 109 98 - 111 mmol/L   CO2 23 22 - 32 mmol/L   Glucose, Bld 156 (H) 70 - 99 mg/dL   BUN 16 6 - 20 mg/dL   Creatinine, Ser 0.97 0.61 - 1.24 mg/dL   Calcium 7.6 (L) 8.9 - 10.3 mg/dL   Total Protein 4.4 (L) 6.5 - 8.1 g/dL   Albumin 2.8 (L) 3.5 - 5.0 g/dL   AST 54 (H) 15 - 41 U/L   ALT 36 0 - 44 U/L   Alkaline Phosphatase 43 38 - 126 U/L   Total Bilirubin 1.0 0.3 - 1.2 mg/dL   GFR, Estimated >60 >60 mL/min   Anion gap 10 5 - 15  Triglycerides     Status: None   Collection Time: 04/18/20  2:28 AM  Result Value Ref Range   Triglycerides 65 <150 mg/dL    Assessment & Plan: The plan of care was discussed with the bedside nurse for the day, Marlowe Kays, who is in agreement with this plan and no additional concerns were raised.   Present on Admission: **None**    LOS: 1 day   Additional comments:I reviewed the patient's new clinical lab test results.   and I reviewed the patients new imaging test results.    [facility]  B/l SAH, IPH - NSGY c/s, Dr. Reatha Armour, stable head  CT, hold keppra in light of intra-op rash of unclear etiology after admin Facial fxs - ENT c/s, Dr. Constance Holster, non-op management Multiple L rib fxs (5-11, 4-7) with PTX, b/l pulm contusions - IS, pulm toilet, pain control, CT to remain to suction, CXR reviewed and no PTX VDRF - PSV this AM, remain intubated for OR later today, possible extubation 12/18 if following commands Hemoperitoneum - s/p exlap with repair of SB mesenteric laceration x2 by Dr. Redmond Pulling, also noted mesenteric hematoma, anticipate possible ileus Comminuted R femur fx - Ortho c/s, Dr. Doreatha Martin, to OR today Open Right tib/fib fxs - Ortho c/s, Dr. Doreatha Martin, to OR today, s/p ancef in TB, but transitioned to levaquin after intra-op rash of unclear etiology after admin ABLA - s/p 4 pRBC and 2FFP, hgb stable, monitor FEN - NPO in light of surgery, start TF at trickle post op, replete hypokalemia DVT - SCDs, SQH okayed by NSGY to start, will start 12/18 Dispo - ICU   Clinical update provided to wife at bedside. All questions answered to her satisfaction.   Critical Care Total Time: 60 minutes  Jesusita Oka, MD Trauma & General Surgery Please use [email or web address] to contact on call provider  04/18/2020  *Care during the described time interval was provided by me. I have reviewed this patient's available data, including medical history, events of note, physical examination and test results as part of my evaluation.
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
## 2020-04-18 NOTE — Progress Notes (Signed)
Wedding ring was removed by me and pt's wife, [name], and was given to her to take home. Nuh Lipton C 9:35 AM

## 2020-04-18 NOTE — Consult Note (Signed)
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 fracture. 2. Displaced fractures of the mid to distal third of the right femoral diaphysis and proximal fibula. Electronically Signed   By: Anner Crete M.D.   On: 04/17/2020 22:25   CT Maxillofacial Wo Contrast  Result Date: 04/17/2020 CLINICAL DATA:  Trauma EXAM: CT MAXILLOFACIAL WITHOUT CONTRAST TECHNIQUE: Multidetector CT imaging of the maxillofacial structures was performed. Multiplanar CT image reconstructions were also generated. COMPARISON:  None. FINDINGS: Osseous: Fractures noted through the lateral and medial walls of the right maxillary sinus. Fracture through the right lateral nasal bone near the nasal lacrimal duct. Zygomatic arches and mandible intact. Orbits: Fractures through the lateral and medial walls  of the right orbit. Sinuses: Blood seen throughout the paranasal sinuses, most notable in the right maxillary sinus. Soft tissues: Soft tissue swelling over the right orbit, forehead and face. Limited intracranial: See head CT report. IMPRESSION: Fractures through the lateral and medial walls of the right orbit. Fracture through the lateral medial walls of the right maxillary sinus. Fracture through the lateral right nasal bone near the junction with the medial orbital wall and nasal lacrimal duct. Blood throughout the paranasal sinuses, most notable in the right maxillary sinus. Critical Value/emergent results were called by telephone at the time of interpretation on 04/17/2020 at 10:08 pm to provider ERIC WILSON , who verbally acknowledged these results. Electronically Signed   By: [name] M.D.   On: 04/17/2020 22:08    SEG:[id number] except as listed in admit H&P  Blood pressure 104/78, pulse 96, temperature 98.6 F (37 C), temperature source Oral, resp. rate (!) 21, height 5\' 6"  (1.676 m), weight 117.9 kg, SpO2 98 %.  PHYSICAL EXAM: Overall appearance: Sedated on ventilator Head: Dried blood and abrasions. Ears: External ears look healthy. Nose: External nose is healthy in appearance. Internal nasal exam free of any lesions or obstruction.  Nasal dorsum is symmetric and stable. Oral Cavity/Pharynx: Oral endotracheal tube in place.. Larynx/Hypopharynx: Deferred Neuro: Sedated on ventilator. Neck: No palpable neck masses.  Cervical collar in place  Studies Reviewed: Maxillofacial CT reviewed.  Procedures: none   Assessment/Plan: Multiple nondisplaced or minimally displaced facial fractures on the right side midface, without any evidence of orbital entrapment or [place] type fracture.  Based on the information I can ascertain right now for examination there is no need for surgical intervention.  If anything changes as he is more awake than I can reevaluate.  Izora Gala 04/18/2020,  9:47 AM

## 2020-04-18 NOTE — Progress Notes (Signed)
Ortho Trauma Progress Note  Patient was seen again with the wife at the bedside.  He is cleared from a neurosurgical standpoint to proceed with of fixation of his lower extremity.  I discussed risks and benefits of surgery with his wife.  I discussed some the treatment.  She agrees to proceed with surgery and consent was obtained.  Shona Needles, MD [facility] [phone] (office) [email or web address]

## 2020-04-18 NOTE — Anesthesia Procedure Notes (Signed)
Arterial Line Insertion Start/End12/17/2021 12:00 AM, 04/18/2020 12:10 AM Performed by: Valetta Fuller, CRNA, CRNA  Left, radial was placed Catheter size: 20 G Hand hygiene performed  and maximum sterile barriers used   Attempts: 1 Procedure performed without using ultrasound guided technique. Following insertion, Biopatch and dressing applied. Post procedure assessment: normal  Patient tolerated the procedure well with no immediate complications.

## 2020-04-18 NOTE — Anesthesia Postprocedure Evaluation (Signed)
Anesthesia Post Note  Patient: Adam Bates  Procedure(s) Performed: EXPLORATORY LAPAROTOMY with repair of small bowel mesentery laceration x2 (N/A )     Patient location during evaluation: NICU Anesthesia Type: General Level of consciousness: sedated and patient remains intubated per anesthesia plan Pain management: pain level controlled Vital Signs Assessment: post-procedure vital signs reviewed and stable Respiratory status: patient remains intubated per anesthesia plan and patient on ventilator - see flowsheet for VS Cardiovascular status: stable Anesthetic complications: no   No complications documented.  Last Vitals:  Vitals:   04/18/20 0630 04/18/20 0645  BP:    Pulse: 86 86  Resp: 20 20  Temp:    SpO2: 100% 100%    Last Pain:  Vitals:   04/18/20 0404  TempSrc: Axillary  PainSc:                  Bonne Whack COKER
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
## 2020-04-18 NOTE — Progress Notes (Signed)
Chest Tube Insertion Procedure Note  Indications:  Clinically significant Pneumothorax  Pre-operative Diagnosis: Pneumothorax -left  Post-operative Diagnosis: same  Procedure Details  Informed consent was not able to be obtained for the procedure due to emergency nature.   After sterile skin prep, using standard technique, a 14 French Cook pigtail chest tube was placed in the left lateral 4th rib space using seldinger technique. Secured with two silk sutures, biopatch, and sterile dressing  Findings: None  Estimated Blood Loss:  Minimal         Specimens:  None              Complications:  None; patient tolerated the procedure well.         Disposition: ED, cxr showed good position of chest tube and re-expansion of lung. small air leak         Condition: stable  Resident: Vanna Scotland MD  Attending: Gayland Curry MD FACS  Attending Attestation: I was present, sterile and performed certain portions of the procedure and otherwise present.   Leighton Ruff. Redmond Pulling, MD, FACS General, Bariatric, & Minimally Invasive Surgery [facility], [place]

## 2020-04-18 NOTE — Transfer of Care (Signed)
Immediate Anesthesia Transfer of Care Note  Patient: GURSHAN SETTLEMIRE  Procedure(s) Performed: INTRAMEDULLARY (IM) NAIL FEMORAL (Right Leg Upper) INTRAMEDULLARY (IM) NAIL TIBIAL (Right Leg Lower)  Patient Location: ICU  Anesthesia Type:General  Level of Consciousness: Patient remains intubated per anesthesia plan  Airway & Oxygen Therapy: Patient remains intubated per anesthesia plan and Patient placed on Ventilator (see vital sign flow sheet for setting)  Post-op Assessment: Report given to RN and Post -op Vital signs reviewed and stable  Post vital signs: Reviewed and stable  Last Vitals:  Vitals Value Taken Time  BP 116/68 04/18/20 1819  Temp    Pulse 103 04/18/20 1828  Resp 26 04/18/20 1828  SpO2 96 % 04/18/20 1828  Vitals shown include unvalidated device data.  Last Pain:  Vitals:   04/18/20 1250  TempSrc: Axillary  PainSc:          Complications: No complications documented.
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
## 2020-04-18 NOTE — Anesthesia Preprocedure Evaluation (Signed)
Anesthesia Evaluation  Patient identified by MRN, date of birth, ID band Patient unresponsive    Reviewed: Patient's Chart, lab work & pertinent test results, Unable to perform ROS - Chart review onlyPreop documentation limited or incomplete due to emergent nature of procedure.  Airway Mallampati: Intubated   Neck ROM: Limited   Comment: Patient intubated in [facility]   Pulmonary     + decreased breath sounds      Cardiovascular  Rhythm:Regular Rate:Tachycardia     Neuro/Psych    GI/Hepatic   Endo/Other    Renal/GU      Musculoskeletal   Abdominal   Peds  Hematology   Anesthesia Other Findings   Reproductive/Obstetrics                             Anesthesia Physical Anesthesia Plan  ASA: IV and emergent  Anesthesia Plan: General   Post-op Pain Management:    Induction: Intravenous  PONV Risk Score and Plan:   Airway Management Planned: Oral ETT  Additional Equipment: Arterial line and CVP  Intra-op Plan:   Post-operative Plan: Post-operative intubation/ventilation  Informed Consent:   Plan Discussed with: CRNA and Anesthesiologist  Anesthesia Plan Comments:         Anesthesia Quick Evaluation
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
## 2020-04-18 NOTE — Transfer of Care (Signed)
Immediate Anesthesia Transfer of Care Note  Patient: Adam Bates  Procedure(s) Performed: EXPLORATORY LAPAROTOMY with repair of small bowel mesentery laceration x2 (N/A )  Patient Location: NICU  Anesthesia Type:General  Level of Consciousness: Patient remains intubated per anesthesia plan  Airway & Oxygen Therapy: Patient placed on Ventilator (see vital sign flow sheet for setting)  Post-op Assessment: Report given to RN and Post -op Vital signs reviewed and stable  Post vital signs: Reviewed and stable  Last Vitals:  Vitals Value Taken Time  BP    Temp    Pulse 101 04/18/20 0152  Resp 24 04/18/20 0152  SpO2 92 % 04/18/20 0152  Vitals shown include unvalidated device data.  Last Pain:  Vitals:   04/17/20 2204  PainSc: 10-Worst pain ever         Complications: No complications documented.

## 2020-04-18 NOTE — Anesthesia Postprocedure Evaluation (Addendum)
Anesthesia Post Note  Patient: Adam Bates  Procedure(s) Performed: INTRAMEDULLARY (IM) NAIL FEMORAL (Right Leg Upper) INTRAMEDULLARY (IM) NAIL TIBIAL (Right Leg Lower)     Patient location during evaluation: SICU Anesthesia Type: General Level of consciousness: sedated Pain management: pain level controlled Vital Signs Assessment: post-procedure vital signs reviewed and stable Respiratory status: patient remains intubated per anesthesia plan Cardiovascular status: stable Anesthetic complications: no   No complications documented.  Last Vitals:  Vitals:   04/18/20 1945 04/18/20 2000  BP:  108/77  Pulse:  98  Resp:  (!) 21  Temp:  36.8 C  SpO2: 98% 98%    Last Pain:  Vitals:   04/18/20 2000  TempSrc: Axillary  PainSc:                  [name]

## 2020-04-18 NOTE — Progress Notes (Signed)
Pt transported from 4N22 to CT and back with no complications.

## 2020-04-18 NOTE — Progress Notes (Addendum)
Alerted by icu nurse that on arrival to icu pt had developed hives on trunk/arms/legs Only medications administered during surgery other than anesthetics were IV keppra which was discontinued when became hypotensive as a potential cause and 2u prbc and 2u FFP. Pt did get 2g ancef in ED a few hours ago  Will hold iv keppra for time being - discussed with pharmacy - unlikely to cause rash like steven johnson that quickly Would also rec hold ancef just in case per pharm rec levaquin for open fx coverage per pharm Blood was type specific Will give benadryl and monitor  Leighton Ruff. Redmond Pulling, MD, FACS General, Bariatric, & Minimally Invasive Surgery [facility], [place]

## 2020-04-18 NOTE — Addendum Note (Signed)
Addendum  created 04/18/20 2124 by [name], MD   Clinical Note Signed

## 2020-04-18 NOTE — Progress Notes (Signed)
Orthopedic Tech Progress Note Patient Details:  Adam Bates 08/10/1970 [id number]  Musculoskeletal Traction Type of Traction: Bucks Skin Traction Traction Location: rle Traction Weight: 20 lbs   Post Interventions Patient Tolerated: Well Instructions Provided: Care of device,Adjustment of device   Karolee Stamps 04/18/2020, 5:44 AM
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
## 2020-04-18 NOTE — Progress Notes (Signed)
Orthopedic Tech Progress Note Patient Details:  Adam Bates August 12, 1970 [id number]  Ortho Devices Type of Ortho Device: Prafo boot/shoe Ortho Device/Splint Location: RLE Ortho Device/Splint Interventions: Application,Adjustment,Ordered   Post Interventions Patient Tolerated: Well Instructions Provided: Care of device   [name] 04/18/2020, 9:25 PM
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
## 2020-04-19 LAB — CBC
HCT: 22.7 % — ABNORMAL LOW (ref 39.0–52.0)
HCT: 23.3 % — ABNORMAL LOW (ref 39.0–52.0)
Hemoglobin: 7.8 g/dL — ABNORMAL LOW (ref 13.0–17.0)
Hemoglobin: 7.6 g/dL — ABNORMAL LOW (ref 13.0–17.0)
MCH: 29.5 pg (ref 26.0–34.0)
MCH: 28.5 pg (ref 26.0–34.0)
MCHC: 34.4 g/dL (ref 30.0–36.0)
MCHC: 32.6 g/dL (ref 30.0–36.0)
MCV: 86 fL (ref 80.0–100.0)
MCV: 87.3 fL (ref 80.0–100.0)
Platelets: 122 10*3/uL — ABNORMAL LOW (ref 150–400)
Platelets: 116 10*3/uL — ABNORMAL LOW (ref 150–400)
RBC: 2.64 MIL/uL — ABNORMAL LOW (ref 4.22–5.81)
RBC: 2.67 MIL/uL — ABNORMAL LOW (ref 4.22–5.81)
RDW: 14.7 % (ref 11.5–15.5)
RDW: 14.9 % (ref 11.5–15.5)
WBC: 7.8 10*3/uL (ref 4.0–10.5)
WBC: 8.3 10*3/uL (ref 4.0–10.5)
nRBC: 0 % (ref 0.0–0.2)
nRBC: 0 % (ref 0.0–0.2)

## 2020-04-19 LAB — PREPARE FRESH FROZEN PLASMA
Unit division: 0
Unit division: 0

## 2020-04-19 LAB — BPAM FFP
Blood Product Expiration Date: 202112212359
Blood Product Expiration Date: 202112212359
ISSUE DATE / TIME: 202112170050
ISSUE DATE / TIME: 202112170050
Unit Type and Rh: 6200
Unit Type and Rh: 6200

## 2020-04-19 LAB — BASIC METABOLIC PANEL
Anion gap: 5 (ref 5–15)
BUN: 10 mg/dL (ref 6–20)
CO2: 22 mmol/L (ref 22–32)
Calcium: 7.1 mg/dL — ABNORMAL LOW (ref 8.9–10.3)
Chloride: 110 mmol/L (ref 98–111)
Creatinine, Ser: 0.82 mg/dL (ref 0.61–1.24)
GFR, Estimated: >60 mL/min (ref >60)
Glucose, Bld: 149 mg/dL — ABNORMAL HIGH (ref 70–99)
Potassium: 3.8 mmol/L (ref 3.5–5.1)
Sodium: 137 mmol/L (ref 135–145)

## 2020-04-19 LAB — GLUCOSE, CAPILLARY
Glucose-Capillary: 134 mg/dL — ABNORMAL HIGH (ref 70–99)
Glucose-Capillary: 131 mg/dL — ABNORMAL HIGH (ref 70–99)
Glucose-Capillary: 124 mg/dL — ABNORMAL HIGH (ref 70–99)
Glucose-Capillary: 149 mg/dL — ABNORMAL HIGH (ref 70–99)
Glucose-Capillary: 147 mg/dL — ABNORMAL HIGH (ref 70–99)

## 2020-04-19 LAB — PREPARE RBC (CROSSMATCH)

## 2020-04-19 LAB — HEMOGLOBIN AND HEMATOCRIT, BLOOD
HCT: 25.6 % — ABNORMAL LOW (ref 39.0–52.0)
Hemoglobin: 8.4 g/dL — ABNORMAL LOW (ref 13.0–17.0)

## 2020-04-19 LAB — TRIGLYCERIDES: Triglycerides: 155 mg/dL — ABNORMAL HIGH (ref <150)

## 2020-04-19 MED ORDER — SODIUM CHLORIDE 0.9% IV SOLUTION: Freq: Once | INTRAVENOUS | Status: AC

## 2020-04-19 MED ORDER — ACETAMINOPHEN 160 MG/5ML PO SOLN
650.0000 mg | Freq: Four times a day (QID) | ORAL | Status: DC | PRN
  Administered 2020-04-19 – 2020-04-20 (×3): 650 mg
  Filled 2020-04-19 (×4): qty 20.3

## 2020-04-19 MED ORDER — DIPHENHYDRAMINE HCL 50 MG/ML IJ SOLN
25.0000 mg | Freq: Once | INTRAMUSCULAR | Status: AC
  Administered 2020-04-19: 25 mg via INTRAVENOUS
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
## 2020-04-19 NOTE — Plan of Care (Signed)
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
## 2020-04-20 ENCOUNTER — Inpatient Hospital Stay (HOSPITAL_COMMUNITY): Payer: Managed Care, Other (non HMO)

## 2020-04-20 LAB — CBC
HCT: 24.1 % — ABNORMAL LOW (ref 39.0–52.0)
Hemoglobin: 7.9 g/dL — ABNORMAL LOW (ref 13.0–17.0)
MCH: 28.9 pg (ref 26.0–34.0)
MCHC: 32.8 g/dL (ref 30.0–36.0)
MCV: 88.3 fL (ref 80.0–100.0)
Platelets: 119 10*3/uL — ABNORMAL LOW (ref 150–400)
RBC: 2.73 MIL/uL — ABNORMAL LOW (ref 4.22–5.81)
RDW: 15 % (ref 11.5–15.5)
WBC: 9.3 10*3/uL (ref 4.0–10.5)
nRBC: 0 % (ref 0.0–0.2)

## 2020-04-20 LAB — BASIC METABOLIC PANEL
Anion gap: 5 (ref 5–15)
BUN: 12 mg/dL (ref 6–20)
CO2: 24 mmol/L (ref 22–32)
Calcium: 7.4 mg/dL — ABNORMAL LOW (ref 8.9–10.3)
Chloride: 111 mmol/L (ref 98–111)
Creatinine, Ser: 0.81 mg/dL (ref 0.61–1.24)
GFR, Estimated: >60 mL/min (ref >60)
Glucose, Bld: 127 mg/dL — ABNORMAL HIGH (ref 70–99)
Potassium: 3.9 mmol/L (ref 3.5–5.1)
Sodium: 140 mmol/L (ref 135–145)

## 2020-04-20 LAB — GLUCOSE, CAPILLARY
Glucose-Capillary: 108 mg/dL — ABNORMAL HIGH (ref 70–99)
Glucose-Capillary: 113 mg/dL — ABNORMAL HIGH (ref 70–99)
Glucose-Capillary: 115 mg/dL — ABNORMAL HIGH (ref 70–99)
Glucose-Capillary: 120 mg/dL — ABNORMAL HIGH (ref 70–99)
Glucose-Capillary: 122 mg/dL — ABNORMAL HIGH (ref 70–99)
Glucose-Capillary: 126 mg/dL — ABNORMAL HIGH (ref 70–99)
Glucose-Capillary: 132 mg/dL — ABNORMAL HIGH (ref 70–99)

## 2020-04-20 LAB — TRIGLYCERIDES: Triglycerides: 191 mg/dL — ABNORMAL HIGH (ref <150)
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
## 2020-04-20 NOTE — Plan of Care (Signed)

## 2020-04-20 NOTE — Progress Notes (Signed)
Follow up - Trauma and Critical Care  Patient Details:    Adam Bates is an 49 y.o. male.  Microbiology/Sepsis markers: Results for orders placed or performed during the hospital encounter of 04/17/20  Resp Panel by RT-PCR (Flu A&B, Covid) Nasopharyngeal Swab     Status: None   Collection Time: 04/17/20  9:57 PM   Specimen: Nasopharyngeal Swab; Nasopharyngeal(NP) swabs in vial transport medium  Result Value Ref Range Status   SARS Coronavirus 2 by RT PCR NEGATIVE NEGATIVE Final    Comment: (NOTE) SARS-CoV-2 target nucleic acids are NOT DETECTED.  The SARS-CoV-2 RNA is generally detectable in upper respiratory specimens during the acute phase of infection. The lowest concentration of SARS-CoV-2 viral copies this assay can detect is 138 copies/mL. A negative result does not preclude SARS-Cov-2 infection and should not be used as the sole basis for treatment or other patient management decisions. A negative result may occur with  improper specimen collection/handling, submission of specimen other than nasopharyngeal swab, presence of viral mutation(s) within the areas targeted by this assay, and inadequate number of viral copies(<138 copies/mL). A negative result must be combined with clinical observations, patient history, and epidemiological information. The expected result is Negative.  Fact Sheet for Patients:  EntrepreneurPulse.com.au  Fact Sheet for Healthcare Providers:  IncredibleEmployment.be  This test is no t yet approved or cleared by the [place] FDA and  has been authorized for detection and/or diagnosis of SARS-CoV-2 by FDA under an Emergency Use Authorization (EUA). This EUA will remain  in effect (meaning this test can be used) for the duration of the COVID-19 declaration under Section 564(b)(1) of the Act, 21 U.S.C.section 360bbb-3(b)(1), unless the authorization is terminated  or revoked sooner.       Influenza A  by PCR NEGATIVE NEGATIVE Final   Influenza B by PCR NEGATIVE NEGATIVE Final    Comment: (NOTE) The Xpert Xpress SARS-CoV-2/FLU/RSV plus assay is intended as an aid in the diagnosis of influenza from Nasopharyngeal swab specimens and should not be used as a sole basis for treatment. Nasal washings and aspirates are unacceptable for Xpert Xpress SARS-CoV-2/FLU/RSV testing.  Fact Sheet for Patients: EntrepreneurPulse.com.au  Fact Sheet for Healthcare Providers: IncredibleEmployment.be  This test is not yet approved or cleared by the [place] FDA and has been authorized for detection and/or diagnosis of SARS-CoV-2 by FDA under an Emergency Use Authorization (EUA). This EUA will remain in effect (meaning this test can be used) for the duration of the COVID-19 declaration under Section 564(b)(1) of the Act, 21 U.S.C. section 360bbb-3(b)(1), unless the authorization is terminated or revoked.  Performed at [facility], [place] [address] [place], [place] [address]   Surgical pcr screen     Status: Abnormal   Collection Time: 04/18/20  9:36 AM   Specimen: Nasal Mucosa; Nasal Swab  Result Value Ref Range Status   MRSA, PCR NEGATIVE NEGATIVE Final   Staphylococcus aureus POSITIVE (A) NEGATIVE Final    Comment: (NOTE) The Xpert SA Assay (FDA approved for NASAL specimens in patients 40 years of age and older), is one component of a comprehensive surveillance program. It is not intended to diagnose infection nor to guide or monitor treatment. Performed at [facility], [place] [address] [place], [place] [address]     Anti-infectives:  Anti-infectives (From admission, onward)   Start     Dose/Rate Route Frequency Ordered Stop   04/18/20 2300  clindamycin (CLEOCIN) IVPB 900 mg       "  And" Linked Group Details   900 mg 100 mL/hr over 30 Minutes Intravenous Every 8 hours 04/18/20 1829 04/21/20 2159   04/18/20 1930  aztreonam (AZACTAM) 2 g  in sodium chloride 0.9 % 100 mL IVPB       "And" Linked Group Details   2 g 200 mL/hr over 30 Minutes Intravenous Every 8 hours 04/18/20 1829 04/21/20 1959   04/18/20 1620  vancomycin (VANCOCIN) powder  Status:  Discontinued          As needed 04/18/20 1620 04/18/20 1812   04/18/20 0600  levofloxacin (LEVAQUIN) IVPB 750 mg  Status:  Discontinued        750 mg 100 mL/hr over 90 Minutes Intravenous Every 24 hours 04/18/20 0204 04/19/20 1052   04/18/20 0100  cefTRIAXone (ROCEPHIN) 2 g in sodium chloride 0.9 % 100 mL IVPB  Status:  Discontinued        2 g 200 mL/hr over 30 Minutes Intravenous Every 24 hours 04/18/20 0046 04/18/20 0202   04/18/20 0030  ceFAZolin (ANCEF) IVPB 2g/100 mL premix  Status:  Discontinued        2 g 200 mL/hr over 30 Minutes Intravenous Every 8 hours 04/18/20 0024 04/18/20 0046   04/17/20 2230  ceFAZolin (ANCEF) IVPB 1 g/50 mL premix  Status:  Discontinued        1 g 100 mL/hr over 30 Minutes Intravenous  Once 04/17/20 2219 04/17/20 2230   04/17/20 2110  ceFAZolin (ANCEF) IVPB 1 g/50 mL premix        over 30 Minutes  Continuous PRN 04/17/20 2117 04/17/20 2110      Consults: Treatment Team:  Md, Trauma, MD Haddix, Thomasene Lot, MD   Chief Complaint/Subjective:    Overnight Issues: Propofol; pressors off since yesterday afternoon. Wife at bedside asking appropriate questions.  Objective:  Vital signs for last 24 hours: Temp:  [99.1 F (37.3 C)-100.1 F (37.8 C)] 99.1 F (37.3 C) (12/19 0806) Pulse Rate:  [93-113] 100 (12/19 0800) Resp:  [17-25] 20 (12/19 0800) BP: (114-142)/(57-76) 124/71 (12/19 0800) SpO2:  [92 %-98 %] 97 % (12/19 0820) Arterial Line BP: (104-137)/(52-65) 117/57 (12/19 0400) FiO2 (%):  [40 %-60 %] 40 % (12/19 0820) Weight:  [103.7 kg] 103.7 kg (12/19 0500)  Hemodynamic parameters for last 24 hours:    Intake/Output from previous day: 12/18 0701 - 12/19 0700 In: 3781.3 [I.V.:2478.6; Blood:322.7; NG/GT:480; IV Piggyback:500] Out: 2440  [Urine:2350; Chest Tube:90]  Intake/Output this shift: Total I/O In: 110.8 [I.V.:90.8; NG/GT:20] Out: -   Vent settings for last 24 hours: Vent Mode: PRVC FiO2 (%):  [40 %-60 %] 40 % Set Rate:  [20 bmp] 20 bmp Vt Set:  [560 mL] 560 mL PEEP:  [5 cmH20] 5 cmH20 Plateau Pressure:  [16 cmH20-18 cmH20] 18 cmH20  Physical Exam:  Gen: intubated sedated HEENT: ETT in position, collar in position Resp: assisted Cardiovascular: on neo,  Abdomen: soft, NT, ND Ext: no edema Neuro: minimal response on noxious stimuli  Results for orders placed or performed during the hospital encounter of 04/17/20 (from the past 24 hour(s))  CBC     Status: Abnormal   Collection Time: 04/19/20 10:34 AM  Result Value Ref Range   WBC 8.3 4.0 - 10.5 K/uL   RBC 2.67 (L) 4.22 - 5.81 MIL/uL   Hemoglobin 7.6 (L) 13.0 - 17.0 g/dL   HCT 23.3 (L) 39.0 - 52.0 %   MCV 87.3 80.0 - 100.0 fL   MCH 28.5 26.0 - 34.0  pg   MCHC 32.6 30.0 - 36.0 g/dL   RDW 14.9 11.5 - 15.5 %   Platelets 116 (L) 150 - 400 K/uL   nRBC 0.0 0.0 - 0.2 %  Basic metabolic panel     Status: Abnormal   Collection Time: 04/19/20 10:34 AM  Result Value Ref Range   Sodium 137 135 - 145 mmol/L   Potassium 3.8 3.5 - 5.1 mmol/L   Chloride 110 98 - 111 mmol/L   CO2 22 22 - 32 mmol/L   Glucose, Bld 149 (H) 70 - 99 mg/dL   BUN 10 6 - 20 mg/dL   Creatinine, Ser 0.82 0.61 - 1.24 mg/dL   Calcium 7.1 (L) 8.9 - 10.3 mg/dL   GFR, Estimated >60 >60 mL/min   Anion gap 5 5 - 15  Glucose, capillary     Status: Abnormal   Collection Time: 04/19/20 11:31 AM  Result Value Ref Range   Glucose-Capillary 124 (H) 70 - 99 mg/dL  Prepare RBC (crossmatch)     Status: None   Collection Time: 04/19/20 11:50 AM  Result Value Ref Range   Order Confirmation      ORDER PROCESSED BY BLOOD BANK BB SAMPLE OR UNITS ALREADY AVAILABLE Performed at [facility], [address], [place], [place] [address]   Glucose, capillary     Status: Abnormal   Collection Time:  04/19/20  3:50 PM  Result Value Ref Range   Glucose-Capillary 149 (H) 70 - 99 mg/dL  Hemoglobin and hematocrit, blood     Status: Abnormal   Collection Time: 04/19/20  5:36 PM  Result Value Ref Range   Hemoglobin 8.4 (L) 13.0 - 17.0 g/dL   HCT 25.6 (L) 39.0 - 52.0 %  Glucose, capillary     Status: Abnormal   Collection Time: 04/19/20  8:17 PM  Result Value Ref Range   Glucose-Capillary 147 (H) 70 - 99 mg/dL  Glucose, capillary     Status: Abnormal   Collection Time: 04/20/20 12:36 AM  Result Value Ref Range   Glucose-Capillary 126 (H) 70 - 99 mg/dL  Triglycerides     Status: Abnormal   Collection Time: 04/20/20  4:49 AM  Result Value Ref Range   Triglycerides 191 (H) <150 mg/dL  CBC     Status: Abnormal   Collection Time: 04/20/20  4:49 AM  Result Value Ref Range   WBC 9.3 4.0 - 10.5 K/uL   RBC 2.73 (L) 4.22 - 5.81 MIL/uL   Hemoglobin 7.9 (L) 13.0 - 17.0 g/dL   HCT 24.1 (L) 39.0 - 52.0 %   MCV 88.3 80.0 - 100.0 fL   MCH 28.9 26.0 - 34.0 pg   MCHC 32.8 30.0 - 36.0 g/dL   RDW 15.0 11.5 - 15.5 %   Platelets 119 (L) 150 - 400 K/uL   nRBC 0.0 0.0 - 0.2 %  Basic metabolic panel     Status: Abnormal   Collection Time: 04/20/20  4:49 AM  Result Value Ref Range   Sodium 140 135 - 145 mmol/L   Potassium 3.9 3.5 - 5.1 mmol/L   Chloride 111 98 - 111 mmol/L   CO2 24 22 - 32 mmol/L   Glucose, Bld 127 (H) 70 - 99 mg/dL   BUN 12 6 - 20 mg/dL   Creatinine, Ser 0.81 0.61 - 1.24 mg/dL   Calcium 7.4 (L) 8.9 - 10.3 mg/dL   GFR, Estimated >60 >60 mL/min   Anion gap 5 5 - 15  Glucose, capillary     Status: Abnormal   Collection Time: 04/20/20  5:13 AM  Result Value Ref Range   Glucose-Capillary 122 (H) 70 - 99 mg/dL  Glucose, capillary     Status: Abnormal   Collection Time: 04/20/20  8:05 AM  Result Value Ref Range   Glucose-Capillary 120 (H) 70 - 99 mg/dL     Assessment/Plan:   [facility]  B/l SAH, IPH - NSGY c/s, Dr. Reatha Armour, stable head CT, hold keppra in light of intra-op rash of  unclear etiology after admin, wean propofol as able; nursing asking neurosurgery to at least provide update on rounds as wife has questions about long-term recovery. Discussed a lot is unknown at this point. Facial fxs - ENT c/s, Dr. Constance Holster, non-op management Multiple L rib fxs (5-11, 4-7) with PTX, b/l pulm contusions - IS, pulm toilet, pain control, CT to suction, CXR pending this AM VDRF - continue ventilator today Hemoperitoneum - s/p exlap with repair of SB mesenteric laceration x2 by Dr. Redmond Pulling, also noted mesenteric hematoma, anticipate possible ileus Comminuted R femur fx - Ortho c/s, Dr. Doreatha Martin, to OR 12/17 Open Right tib/fib fxs - Ortho c/s, Dr. Doreatha Martin, to OR 12/17, s/p ancef in TB, but transitioned to levaquin after intra-op rash of unclear etiology after admin ABLA - s/p 4 pRBC and 2FFP, hgb down after ortho surgery but improved after 1U PRBC 12/18. FEN -continue tube feeds DVT - SCDs, SQH okayed by NSGY to start 12/18 Dispo - ICU    LOS: 3 days   Additional comments:I reviewed the patient's new clinical lab test results. Hgb 7.9 from 7.8 following 1U. Monitor.   Critical Care Total Time*: 45 minutes  Ileana Roup 04/20/2020  *Care during the described time interval was provided by me and/or other providers on the critical care team.  I have reviewed this patient's available data, including medical history, events of note, physical examination and test results as part of my evaluation.
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
## 2020-04-21 ENCOUNTER — Inpatient Hospital Stay (HOSPITAL_COMMUNITY): Payer: Managed Care, Other (non HMO)

## 2020-04-21 ENCOUNTER — Encounter (HOSPITAL_COMMUNITY): Payer: Self-pay | Admitting: Student

## 2020-04-21 LAB — HEMOGLOBIN AND HEMATOCRIT, BLOOD
HCT: 27.7 % — ABNORMAL LOW (ref 39.0–52.0)
Hemoglobin: 9 g/dL — ABNORMAL LOW (ref 13.0–17.0)

## 2020-04-21 LAB — BPAM RBC
Blood Product Expiration Date: 202112252359
Blood Product Expiration Date: 202112292359
Blood Product Expiration Date: 202201112359
Blood Product Expiration Date: 202201132359
Blood Product Expiration Date: 202201132359
Blood Product Expiration Date: 202201132359
ISSUE DATE / TIME: 202112162105
ISSUE DATE / TIME: 202112162105
ISSUE DATE / TIME: 202112162343
ISSUE DATE / TIME: 202112162343
ISSUE DATE / TIME: 202112162343
ISSUE DATE / TIME: 202112181338
Unit Type and Rh: 5100
Unit Type and Rh: 5100
Unit Type and Rh: 6200
Unit Type and Rh: 6200
Unit Type and Rh: 6200
Unit Type and Rh: 6200

## 2020-04-21 LAB — TYPE AND SCREEN
ABO/RH(D): A POS
Antibody Screen: NEGATIVE
Unit division: 0
Unit division: 0
Unit division: 0
Unit division: 0
Unit division: 0
Unit division: 0

## 2020-04-21 LAB — CBC
HCT: 22.6 % — ABNORMAL LOW (ref 39.0–52.0)
Hemoglobin: 7.1 g/dL — ABNORMAL LOW (ref 13.0–17.0)
MCH: 28.1 pg (ref 26.0–34.0)
MCHC: 31.4 g/dL (ref 30.0–36.0)
MCV: 89.3 fL (ref 80.0–100.0)
Platelets: 145 10*3/uL — ABNORMAL LOW (ref 150–400)
RBC: 2.53 MIL/uL — ABNORMAL LOW (ref 4.22–5.81)
RDW: 15.4 % (ref 11.5–15.5)
WBC: 7.8 10*3/uL (ref 4.0–10.5)
nRBC: 0.9 % — ABNORMAL HIGH (ref 0.0–0.2)

## 2020-04-21 LAB — BASIC METABOLIC PANEL
Anion gap: 6 (ref 5–15)
BUN: 16 mg/dL (ref 6–20)
CO2: 25 mmol/L (ref 22–32)
Calcium: 7.4 mg/dL — ABNORMAL LOW (ref 8.9–10.3)
Chloride: 111 mmol/L (ref 98–111)
Creatinine, Ser: 0.67 mg/dL (ref 0.61–1.24)
GFR, Estimated: >60 mL/min (ref >60)
Glucose, Bld: 122 mg/dL — ABNORMAL HIGH (ref 70–99)
Potassium: 3.8 mmol/L (ref 3.5–5.1)
Sodium: 142 mmol/L (ref 135–145)

## 2020-04-21 LAB — PREPARE RBC (CROSSMATCH)

## 2020-04-21 LAB — GLUCOSE, CAPILLARY
Glucose-Capillary: 120 mg/dL — ABNORMAL HIGH (ref 70–99)
Glucose-Capillary: 108 mg/dL — ABNORMAL HIGH (ref 70–99)
Glucose-Capillary: 122 mg/dL — ABNORMAL HIGH (ref 70–99)
Glucose-Capillary: 116 mg/dL — ABNORMAL HIGH (ref 70–99)
Glucose-Capillary: 124 mg/dL — ABNORMAL HIGH (ref 70–99)
Glucose-Capillary: 120 mg/dL — ABNORMAL HIGH (ref 70–99)

## 2020-04-21 LAB — TRIGLYCERIDES: Triglycerides: 201 mg/dL — ABNORMAL HIGH (ref <150)

## 2020-04-21 MED ORDER — SODIUM CHLORIDE 0.9% IV SOLUTION: Freq: Once | INTRAVENOUS | Status: DC

## 2020-04-21 MED ORDER — CHLORHEXIDINE GLUCONATE CLOTH 2 % EX PADS
6.0000 | MEDICATED_PAD | Freq: Every day | CUTANEOUS | Status: DC
  Administered 2020-04-21 – 2020-05-08 (×16): 6 via TOPICAL

## 2020-04-21 MED ORDER — PIVOT 1.5 CAL PO LIQD
1000.0000 mL | ORAL | Status: DC
  Administered 2020-04-21 – 2020-05-05 (×14): 1000 mL
  Filled 2020-04-21 (×20): qty 1000

## 2020-04-21 NOTE — Progress Notes (Signed)
3 Days Post-Op   Subjective/Chief Complaint: PT con't on vent CXR noted   Objective: Vital signs in last 24 hours: Temp:  [99.68 F (37.6 C)-100.4 F (38 C)] 100.22 F (37.9 C) (12/20 0800) Pulse Rate:  [83-104] 89 (12/20 0813) Resp:  [15-20] 20 (12/20 0813) BP: (112-133)/(61-72) 116/66 (12/20 0800) SpO2:  [94 %-100 %] 96 % (12/20 0814) FiO2 (%):  [30 %-40 %] 30 % (12/20 0814) Weight:  [103.9 kg] 103.9 kg (12/20 0500) Last BM Date:  (PTA)  Intake/Output from previous day: 12/19 0701 - 12/20 0700 In: 3084.6 [I.V.:2104.6; NG/GT:480; IV Piggyback:500] Out: 2250 [Urine:2150; Chest Tube:100] Intake/Output this shift: Total I/O In: -  Out: 275 [Urine:275]  Physical Exam:  Gen: intubated sedated HEENT: ETT in position, collar in position Resp: assisted, L CT in place Cardiovascular: on neo,  Abdomen: soft, NT, ND Ext: no edema Neuro: minimal response on noxious stimuli   Lab Results:  Recent Labs    04/20/20 0449 04/21/20 0340  WBC 9.3 7.8  HGB 7.9* 7.1*  HCT 24.1* 22.6*  PLT 119* 145*   BMET Recent Labs    04/20/20 0449 04/21/20 0340  NA 140 142  K 3.9 3.8  CL 111 111  CO2 24 25  GLUCOSE 127* 122*  BUN 12 16  CREATININE 0.81 0.67  CALCIUM 7.4* 7.4*   PT/INR No results for input(s): LABPROT, INR in the last 72 hours. ABG Recent Labs    04/18/20 1715  PHART 7.344*  HCO3 22.8    Studies/Results: DG CHEST PORT 1 VIEW  Result Date: 04/21/2020 CLINICAL DATA:  Intubation. EXAM: PORTABLE CHEST 1 VIEW COMPARISON:  04/20/2020.  04/18/2020.  CT 04/17/2020. FINDINGS: Endotracheal tube, NG tube, left subclavian line, left chest tube in stable position. Stable mediastinal prominence most likely prominent great vessels. Heart size normal. Low lung volumes. Progressive bibasilar infiltrates/edema. Tiny left pleural effusion cannot be excluded. No pneumothorax. Thoracic spine scoliosis. Left rib fractures again noted. IMPRESSION: 1. Lines and tubes including  left chest tube in stable position. No pneumothorax. 2. Low lung volumes. Progressive bibasilar infiltrates/edema. Tiny left pleural effusion cannot be excluded. 3. Left rib fractures again noted. Electronically Signed   By: Marcello Moores  Register   On: 04/21/2020 05:42   DG CHEST PORT 1 VIEW  Result Date: 04/20/2020 CLINICAL DATA:  History of ET tube. EXAM: PORTABLE CHEST 1 VIEW COMPARISON:  April 18, 2020 FINDINGS: The ETT is in good position. A left central line is stable as well. Stable left-sided chest tube. The NG tube terminates below today's film. No pneumothorax. Persistent opacity in the left retrocardiac region, stable. Mild hazy opacity in the right base is more prominent/worsened in the interval. No change in the cardiomediastinal silhouette. No other acute abnormalities. IMPRESSION: 1. Stable support apparatus. 2. Persistent opacity in left retrocardiac region, stable. 3. Increasing hazy opacity in the right base could represent developing infiltrate or atelectasis. Recommend attention on follow-up. 4. No other changes. Electronically Signed   By: Dorise Bullion III M.D   On: 04/20/2020 13:22    Anti-infectives: Anti-infectives (From admission, onward)   Start     Dose/Rate Route Frequency Ordered Stop   04/18/20 2300  clindamycin (CLEOCIN) IVPB 900 mg       "And" Linked Group Details   900 mg 100 mL/hr over 30 Minutes Intravenous Every 8 hours 04/18/20 1829 04/21/20 2159   04/18/20 1930  aztreonam (AZACTAM) 2 g in sodium chloride 0.9 % 100 mL IVPB       "  And" Linked Group Details   2 g 200 mL/hr over 30 Minutes Intravenous Every 8 hours 04/18/20 1829 04/21/20 1959   04/18/20 1620  vancomycin (VANCOCIN) powder  Status:  Discontinued          As needed 04/18/20 1620 04/18/20 1812   04/18/20 0600  levofloxacin (LEVAQUIN) IVPB 750 mg  Status:  Discontinued        750 mg 100 mL/hr over 90 Minutes Intravenous Every 24 hours 04/18/20 0204 04/19/20 1052   04/18/20 0100  cefTRIAXone  (ROCEPHIN) 2 g in sodium chloride 0.9 % 100 mL IVPB  Status:  Discontinued        2 g 200 mL/hr over 30 Minutes Intravenous Every 24 hours 04/18/20 0046 04/18/20 0202   04/18/20 0030  ceFAZolin (ANCEF) IVPB 2g/100 mL premix  Status:  Discontinued        2 g 200 mL/hr over 30 Minutes Intravenous Every 8 hours 04/18/20 0024 04/18/20 0046   04/17/20 2230  ceFAZolin (ANCEF) IVPB 1 g/50 mL premix  Status:  Discontinued        1 g 100 mL/hr over 30 Minutes Intravenous  Once 04/17/20 2219 04/17/20 2230   04/17/20 2110  ceFAZolin (ANCEF) IVPB 1 g/50 mL premix        over 30 Minutes  Continuous PRN 04/17/20 2117 04/17/20 2110      Assessment/Plan: [facility]  B/l SAH,IPH- NSGY c/s, Dr. Reatha Armour, stable head CT, hold keppra in light of intra-op rash of unclear etiology after admin, wean propofol as able; nursing asking neurosurgery to at least provide update on rounds as wife has questions about long-term recovery. Discussed a lot is unknown at this point. Facial fxs- ENT c/s, Dr. Constance Holster, non-op management Multiple L rib fxs (5-11, 4-7) with PTX, b/l pulm contusions- IS, pulm toilet, pain control, CT to suction, CXR looks good today- CT to waterseal today VDRF- continue ventilator today Hemoperitoneum- s/p exlap with repair of SB mesenteric laceration x2 by Dr. Redmond Pulling, also noted mesenteric hematoma, anticipate possible ileus ComminutedR femur fx - Ortho c/s, Dr. Doreatha Martin, to OR 12/17 Open Right tib/fib fxs- Ortho c/s, Dr. Doreatha Martin, to OR 12/17, s/p ancef in TB, but transitioned to levaquin after intra-op rash of unclear etiology after admin ABLA- will trx 2U PRBC today.  This will help with vent weaning. FEN -continue tube feeds DVT - SCDs,SQH okayed by NSGY to start 12/18 Dispo -ICU   LOS: 3 days     Critical Care Total Time*: 30 minutes   LOS: 4 days    Ralene Ok 04/21/2020
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
## 2020-04-21 NOTE — Plan of Care (Signed)

## 2020-04-21 NOTE — Addendum Note (Signed)
Addendum  created 04/21/20 1019 by Josephine Igo, CRNA   Order list changed, Pharmacy for encounter modified

## 2020-04-21 NOTE — Progress Notes (Signed)
   04/21/20 1725  Clinical Encounter Type  Visited With Patient and family together  Visit Type Initial  Referral From Other (Comment) (Family Friend)  Consult/Referral To Chaplain  Spiritual Encounters  Spiritual Needs Emotional (Visits from [place])  Stress Factors  Family Stress Factors Health changes;Lack of knowledge   Chaplain visited Pt's wife, [name], at bedside. Chaplain informed Pt's wife that ministers do not count towards the 1 visitor policy and was encouraged by that update. Chaplain provided emotional and spiritual support. Pt's wife is struggling to balance being there for her husband and her four kids, who are not able to visit, while facing the possibility of a months' long stay. Pt's wife's faith is important to her and is giving her strength to face the unknown. Chaplain remains available as needed.   This note was prepared by Chaplain Resident, Dante Gang, MDiv. Chaplain remains available as needed through the on-call pager: [phone].
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
## 2020-04-21 NOTE — Plan of Care (Signed)
  Problem: Nutrition: Goal: Adequate nutrition will be maintained Outcome: Progressing   Problem: Elimination: Goal: Will not experience complications related to urinary retention Outcome: Progressing   Problem: Pain Managment: Goal: General experience of comfort will improve Outcome: Progressing   Problem: Activity: Goal: Risk for activity intolerance will decrease Outcome: Not Progressing

## 2020-04-21 NOTE — Progress Notes (Signed)
Neurosurgery Service Progress Note  Subjective: No acute events overnight   Objective: Vitals:   04/21/20 0800 04/21/20 0813 04/21/20 0814 04/21/20 0900  BP: 116/66   (!) 152/84  Pulse: 87 89  (!) 109  Resp: 20 20  (!) 22  Temp: 100.22 F (37.9 C)   100.04 F (37.8 C)  TempSrc: Axillary     SpO2: 95% 96% 96% 94%  Weight:      Height:        Physical Exam: Intubated, sedated, eyes closed to stim, PERRL, gaze neutral, +c/c/g, min w/d on L, RLE casted, no w/d in RUE  Assessment & Plan: 49 y.o. 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  -MRI w/o contrast to evaluate for DAI when patient is stable for MRI. MRI is for prognostic purposes, will not change treatment, I informed the wife that we are therefore not in a rush to get it and can wait until trauma thinks the timing is better -okay for DVT chemoPPx  Judith Part  04/21/20 10:30 AM

## 2020-04-22 ENCOUNTER — Inpatient Hospital Stay (HOSPITAL_COMMUNITY): Payer: Managed Care, Other (non HMO)

## 2020-04-22 LAB — CBC
HCT: 26.9 % — ABNORMAL LOW (ref 39.0–52.0)
Hemoglobin: 9 g/dL — ABNORMAL LOW (ref 13.0–17.0)
MCH: 30.1 pg (ref 26.0–34.0)
MCHC: 33.5 g/dL (ref 30.0–36.0)
MCV: 90 fL (ref 80.0–100.0)
Platelets: 180 10*3/uL (ref 150–400)
RBC: 2.99 MIL/uL — ABNORMAL LOW (ref 4.22–5.81)
RDW: 15.7 % — ABNORMAL HIGH (ref 11.5–15.5)
WBC: 8.5 10*3/uL (ref 4.0–10.5)
nRBC: 1.3 % — ABNORMAL HIGH (ref 0.0–0.2)

## 2020-04-22 LAB — BASIC METABOLIC PANEL
Anion gap: 7 (ref 5–15)
BUN: 20 mg/dL (ref 6–20)
CO2: 24 mmol/L (ref 22–32)
Calcium: 7.5 mg/dL — ABNORMAL LOW (ref 8.9–10.3)
Chloride: 114 mmol/L — ABNORMAL HIGH (ref 98–111)
Creatinine, Ser: 0.72 mg/dL (ref 0.61–1.24)
GFR, Estimated: >60 mL/min (ref >60)
Glucose, Bld: 115 mg/dL — ABNORMAL HIGH (ref 70–99)
Potassium: 3.9 mmol/L (ref 3.5–5.1)
Sodium: 145 mmol/L (ref 135–145)

## 2020-04-22 LAB — TYPE AND SCREEN
ABO/RH(D): A POS
Antibody Screen: NEGATIVE
Unit division: 0
Unit division: 0

## 2020-04-22 LAB — GLUCOSE, CAPILLARY
Glucose-Capillary: 116 mg/dL — ABNORMAL HIGH (ref 70–99)
Glucose-Capillary: 104 mg/dL — ABNORMAL HIGH (ref 70–99)
Glucose-Capillary: 128 mg/dL — ABNORMAL HIGH (ref 70–99)
Glucose-Capillary: 116 mg/dL — ABNORMAL HIGH (ref 70–99)
Glucose-Capillary: 116 mg/dL — ABNORMAL HIGH (ref 70–99)
Glucose-Capillary: 107 mg/dL — ABNORMAL HIGH (ref 70–99)

## 2020-04-22 LAB — BPAM RBC
Blood Product Expiration Date: 202201162359
Blood Product Expiration Date: 202201192359
ISSUE DATE / TIME: 202112201245
ISSUE DATE / TIME: 202112201558
Unit Type and Rh: 6200
Unit Type and Rh: 6200

## 2020-04-22 LAB — TRIGLYCERIDES: Triglycerides: 172 mg/dL — ABNORMAL HIGH (ref <150)

## 2020-04-22 NOTE — Progress Notes (Signed)
Adam Bates has weaned well all day.  Placing him back on full ventilator support for night time rest.  We will continue with weaning in the morning.

## 2020-04-22 NOTE — Progress Notes (Signed)
Patient ID: Adam Bates, male   DOB: [birth year], 49 y.o.   MRN: [id number] Vital signs are stable Patient neurologic status remains unchanged.  Plan is to obtain MRI when clinically stable.  This is for prognostic concerns regarding diffuse axonal injury.  Continue supportive care for now.
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
## 2020-04-22 NOTE — Progress Notes (Signed)
Patient ID: Adam Bates, male   DOB: 1970/12/29, 49 y.o.   MRN: [id number] Follow up - Trauma Critical Care  Patient Details:    Adam Bates is an 49 y.o. male.  Lines/tubes : Airway 8 mm (Active)  Secured at (cm) 25 cm 04/22/20 0756  Measured From Lips 04/22/20 [place] 04/22/20 0756  Secured By [employer] 04/22/20 0756  Tube Holder Repositioned Yes 04/22/20 0756  Prone position No 04/22/20 0029  Head position Right 04/22/20 0300  Cuff Pressure (cm H2O) 29 cm H2O 04/22/20 0756  Site Condition Dry 04/22/20 0756     CVC Triple Lumen 04/18/20 Left Subclavian 15 cm (Active)  Indication for Insertion or Continuance of Line Prolonged intravenous therapies 04/22/20 0800  Site Assessment Clean;Dry;Intact 04/22/20 0800  Proximal Lumen Status Infusing 04/22/20 0800  Medial Lumen Status Infusing 04/22/20 0800  Distal Lumen Status Infusing 04/22/20 0800  Dressing Type Transparent;Occlusive 04/22/20 0800  Dressing Status Clean;Dry;Intact 04/22/20 0800  Antimicrobial disc in place? Yes 04/22/20 0800  Line Care Connections checked and tightened 04/22/20 0800  Dressing Change Due 04/25/20 04/21/20 2000     Chest Tube 1 Left Pleural (Active)  Status To water seal 04/22/20 0800  Chest Tube Air Leak None 04/22/20 0800  Patency Intervention Tip/tilt 04/22/20 0800  Drainage Description Serosanguineous 04/22/20 0800  Dressing Status Clean;Dry;Intact 04/22/20 0800  Dressing Intervention New dressing 04/21/20 2000  Site Assessment Clean;Intact;Dry 04/22/20 0800  Surrounding Skin Dry;Intact 04/22/20 0800  Output (mL) 50 mL 04/21/20 1800     Negative Pressure Wound Therapy Leg Lower;Anterior (Active)  Last dressing change 04/18/20 04/21/20 2000  Site / Wound Assessment Dressing in place / Unable to assess 04/22/20 0800  Peri-wound Assessment Intact 04/22/20 0800  Cycle Continuous 04/22/20 0800  Target Pressure (mmHg) 125 04/22/20 0800  Dressing Status  Intact 04/22/20 0800  Drainage Amount None 04/22/20 0800  Output (mL) 0 mL 04/21/20 1800     NG/OG Tube 18 Fr. Center mouth Xray;Confirmed by Surgical Manipulation;Aucultation (Active)  External Length of Tube (cm) - (if applicable) 65 cm [id number]  Site Assessment Clean;Dry;Intact 04/22/20 0800  Ongoing Placement Verification No change in cm markings or external length of tube from initial placement 04/22/20 0800  Status Infusing tube feed 04/22/20 0800  Amount of suction 110 mmHg 04/18/20 0800  Drainage Appearance Thick;Yellow 04/18/20 0800     External Urinary Catheter (Active)  Collection Container Dedicated Suction Canister 04/22/20 0800  Securement Method Tape 04/22/20 0800  Site Assessment Clean;Intact 04/22/20 0800  Intervention Equipment Changed 04/22/20 0400  Output (mL) 350 mL 04/22/20 0800    Microbiology/Sepsis markers: Results for orders placed or performed during the hospital encounter of 04/17/20  Resp Panel by RT-PCR (Flu A&B, Covid) Nasopharyngeal Swab     Status: None   Collection Time: 04/17/20  9:57 PM   Specimen: Nasopharyngeal Swab; Nasopharyngeal(NP) swabs in vial transport medium  Result Value Ref Range Status   SARS Coronavirus 2 by RT PCR NEGATIVE NEGATIVE Final    Comment: (NOTE) SARS-CoV-2 target nucleic acids are NOT DETECTED.  The SARS-CoV-2 RNA is generally detectable in upper respiratory specimens during the acute phase of infection. The lowest concentration of SARS-CoV-2 viral copies this assay can detect is 138 copies/mL. A negative result does not preclude SARS-Cov-2 infection and should not be used as the sole basis for treatment or other patient management decisions. A negative result may occur with  improper specimen collection/handling, submission of specimen other than  nasopharyngeal swab, presence of viral mutation(s) within the areas targeted by this assay, and inadequate number of viral copies(<138 copies/mL). A negative result  must be combined with clinical observations, patient history, and epidemiological information. The expected result is Negative.  Fact Sheet for Patients:  EntrepreneurPulse.com.au  Fact Sheet for Healthcare Providers:  IncredibleEmployment.be  This test is no t yet approved or cleared by the [place] FDA and  has been authorized for detection and/or diagnosis of SARS-CoV-2 by FDA under an Emergency Use Authorization (EUA). This EUA will remain  in effect (meaning this test can be used) for the duration of the COVID-19 declaration under Section 564(b)(1) of the Act, 21 U.S.C.section 360bbb-3(b)(1), unless the authorization is terminated  or revoked sooner.       Influenza A by PCR NEGATIVE NEGATIVE Final   Influenza B by PCR NEGATIVE NEGATIVE Final    Comment: (NOTE) The Xpert Xpress SARS-CoV-2/FLU/RSV plus assay is intended as an aid in the diagnosis of influenza from Nasopharyngeal swab specimens and should not be used as a sole basis for treatment. Nasal washings and aspirates are unacceptable for Xpert Xpress SARS-CoV-2/FLU/RSV testing.  Fact Sheet for Patients: EntrepreneurPulse.com.au  Fact Sheet for Healthcare Providers: IncredibleEmployment.be  This test is not yet approved or cleared by the [place] FDA and has been authorized for detection and/or diagnosis of SARS-CoV-2 by FDA under an Emergency Use Authorization (EUA). This EUA will remain in effect (meaning this test can be used) for the duration of the COVID-19 declaration under Section 564(b)(1) of the Act, 21 U.S.C. section 360bbb-3(b)(1), unless the authorization is terminated or revoked.  Performed at [facility], [place] [address], [place], [place] [address]   Surgical pcr screen     Status: Abnormal   Collection Time: 04/18/20  9:36 AM   Specimen: Nasal Mucosa; Nasal Swab  Result Value Ref Range Status   MRSA, PCR  NEGATIVE NEGATIVE Final   Staphylococcus aureus POSITIVE (A) NEGATIVE Final    Comment: (NOTE) The Xpert SA Assay (FDA approved for NASAL specimens in patients 45 years of age and older), is one component of a comprehensive surveillance program. It is not intended to diagnose infection nor to guide or monitor treatment. Performed at [facility], [place] [address] [place], [place] [address]     Anti-infectives:  Anti-infectives (From admission, onward)   Start     Dose/Rate Route Frequency Ordered Stop   04/18/20 2300  clindamycin (CLEOCIN) IVPB 900 mg       "And" Linked Group Details   900 mg 100 mL/hr over 30 Minutes Intravenous Every 8 hours 04/18/20 1829 04/21/20 1511   04/18/20 1930  aztreonam (AZACTAM) 2 g in sodium chloride 0.9 % 100 mL IVPB       "And" Linked Group Details   2 g 200 mL/hr over 30 Minutes Intravenous Every 8 hours 04/18/20 1829 04/21/20 1226   04/18/20 1620  vancomycin (VANCOCIN) powder  Status:  Discontinued          As needed 04/18/20 1620 04/18/20 1812   04/18/20 0600  levofloxacin (LEVAQUIN) IVPB 750 mg  Status:  Discontinued        750 mg 100 mL/hr over 90 Minutes Intravenous Every 24 hours 04/18/20 0204 04/19/20 1052   04/18/20 0100  cefTRIAXone (ROCEPHIN) 2 g in sodium chloride 0.9 % 100 mL IVPB  Status:  Discontinued        2 g 200 mL/hr over 30 Minutes Intravenous Every 24 hours  04/18/20 0046 04/18/20 0202   04/18/20 0030  ceFAZolin (ANCEF) IVPB 2g/100 mL premix  Status:  Discontinued        2 g 200 mL/hr over 30 Minutes Intravenous Every 8 hours 04/18/20 0024 04/18/20 0046   04/17/20 2230  ceFAZolin (ANCEF) IVPB 1 g/50 mL premix  Status:  Discontinued        1 g 100 mL/hr over 30 Minutes Intravenous  Once 04/17/20 2219 04/17/20 2230   04/17/20 2110  ceFAZolin (ANCEF) IVPB 1 g/50 mL premix        over 30 Minutes  Continuous PRN 04/17/20 2117 04/17/20 2110      Best Practice/Protocols:  VTE Prophylaxis: Heparin (SQ) Continous  Sedation  Consults: Treatment Team:  Md, Trauma, MD Haddix, Thomasene Lot, MD Judith Part, MD    Studies:    Events:  Subjective:    Overnight Issues:   Objective:  Vital signs for last 24 hours: Temp:  [98.2 F (36.8 C)-100.2 F (37.9 C)] 99.5 F (37.5 C) (12/21 0800) Pulse Rate:  [80-107] 102 (12/21 0900) Resp:  [17-24] 21 (12/21 0900) BP: (122-141)/(67-93) 137/88 (12/21 0900) SpO2:  [94 %-100 %] 96 % (12/21 0900) FiO2 (%):  [30 %-40 %] 40 % (12/21 0756) Weight:  [103.4 kg] 103.4 kg (12/21 0500)  Hemodynamic parameters for last 24 hours:    Intake/Output from previous day: 12/20 0701 - 12/21 0700 In: 3647.1 [I.V.:2257.1; Blood:630; NG/GT:760] Out: 2025 [id number]; Chest Tube:50]  Intake/Output this shift: Total I/O In: -  Out: 350 [Urine:350]  Vent settings for last 24 hours: Vent Mode: PSV;CPAP FiO2 (%):  [30 %-40 %] 40 % Set Rate:  [20 bmp] 20 bmp Vt Set:  [560 mL] 560 mL PEEP:  [5 cmH20] 5 cmH20 Pressure Support:  [10 cmH20] 10 cmH20 Plateau Pressure:  [16 cmH20-19 cmH20] 18 cmH20  Physical Exam:  General: on vent Neuro: quite sedated now HEENT/Neck: ETT Resp: clear to auscultation bilaterally CVS: RRR GI: soft, dressing in place, +BS Extremities: edema 1+  Results for orders placed or performed during the hospital encounter of 04/17/20 (from the past 24 hour(s))  Type and screen [place]     Status: None (Preliminary result)   Collection Time: 04/21/20 10:45 AM  Result Value Ref Range   ABO/RH(D) A POS    Antibody Screen NEG    Sample Expiration 04/24/2020,2359    Unit Number [id number]    Blood Component Type RED CELLS,LR    Unit division 00    Status of Unit ISSUED    Transfusion Status OK TO TRANSFUSE    Crossmatch Result      Compatible Performed at [facility], [address] [place], [place] [address]    Unit Number [id number]    Blood Component Type RED CELLS,LR    Unit division 00     Status of Unit ISSUED    Transfusion Status OK TO TRANSFUSE    Crossmatch Result Compatible   Prepare RBC (crossmatch)     Status: None   Collection Time: 04/21/20 10:45 AM  Result Value Ref Range   Order Confirmation      ORDER PROCESSED BY BLOOD BANK Performed at [facility], [place] [address] [place], [place] [address]   Glucose, capillary     Status: Abnormal   Collection Time: 04/21/20 11:22 AM  Result Value Ref Range   Glucose-Capillary 122 (H) 70 - 99 mg/dL  Glucose, capillary     Status: Abnormal  Collection Time: 04/21/20  4:12 PM  Result Value Ref Range   Glucose-Capillary 116 (H) 70 - 99 mg/dL  Glucose, capillary     Status: Abnormal   Collection Time: 04/21/20  7:34 PM  Result Value Ref Range   Glucose-Capillary 124 (H) 70 - 99 mg/dL  Hemoglobin and hematocrit, blood     Status: Abnormal   Collection Time: 04/21/20  8:53 PM  Result Value Ref Range   Hemoglobin 9.0 (L) 13.0 - 17.0 g/dL   HCT 27.7 (L) 39.0 - 52.0 %  Glucose, capillary     Status: Abnormal   Collection Time: 04/21/20 11:22 PM  Result Value Ref Range   Glucose-Capillary 120 (H) 70 - 99 mg/dL  Triglycerides     Status: Abnormal   Collection Time: 04/22/20  3:19 AM  Result Value Ref Range   Triglycerides 172 (H) <150 mg/dL  CBC     Status: Abnormal   Collection Time: 04/22/20  3:19 AM  Result Value Ref Range   WBC 8.5 4.0 - 10.5 K/uL   RBC 2.99 (L) 4.22 - 5.81 MIL/uL   Hemoglobin 9.0 (L) 13.0 - 17.0 g/dL   HCT 26.9 (L) 39.0 - 52.0 %   MCV 90.0 80.0 - 100.0 fL   MCH 30.1 26.0 - 34.0 pg   MCHC 33.5 30.0 - 36.0 g/dL   RDW 15.7 (H) 11.5 - 15.5 %   Platelets 180 150 - 400 K/uL   nRBC 1.3 (H) 0.0 - 0.2 %  Basic metabolic panel     Status: Abnormal   Collection Time: 04/22/20  3:19 AM  Result Value Ref Range   Sodium 145 135 - 145 mmol/L   Potassium 3.9 3.5 - 5.1 mmol/L   Chloride 114 (H) 98 - 111 mmol/L   CO2 24 22 - 32 mmol/L   Glucose, Bld 115 (H) 70 - 99 mg/dL   BUN 20 6 - 20 mg/dL    Creatinine, Ser 0.72 0.61 - 1.24 mg/dL   Calcium 7.5 (L) 8.9 - 10.3 mg/dL   GFR, Estimated >60 >60 mL/min   Anion gap 7 5 - 15  Glucose, capillary     Status: Abnormal   Collection Time: 04/22/20  3:31 AM  Result Value Ref Range   Glucose-Capillary 116 (H) 70 - 99 mg/dL  Glucose, capillary     Status: Abnormal   Collection Time: 04/22/20  7:27 AM  Result Value Ref Range   Glucose-Capillary 104 (H) 70 - 99 mg/dL    Assessment & Plan: Present on Admission: **None**    LOS: 5 days   Additional comments:I reviewed the patient's new clinical lab test results. and CXR MCC  TBI SAH,IPH- NSGY c/s, Dr. Reatha Armour, stable head CT, hold keppra in light of intra-op rash of unclear etiology after admin, wean propofol as able; NS plans MRI for prognosis Facial fxs- ENT c/s, Dr. Constance Holster, non-op management Multiple L rib fxs (5-11, 4-7) with PTX, b/l pulm contusions- IS, pulm toilet, pain control, CT to waterseal 12/20, no PTX today and put out 50cc - remove chest tube VDRF- continue wean - doing better today than yesterday Hemoperitoneum- s/p exlap with repair of SB mesenteric laceration x2 by Dr. Redmond Pulling, also noted mesenteric hematoma, anticipate possible ileus ComminutedR femur fx - Ortho c/s, Dr. Doreatha Martin, to OR 12/17 Open Right tib/fib fxs- Ortho c/s, Dr. Doreatha Martin, to OR 12/17, s/p ancef in TB, but transitioned to levaquin after intra-op rash of unclear etiology after admin ABLA- Hb 9 s/p 2u  PRBC FEN - continue tube feeds, had BM DVT - SCDs,SQH OK by NSGY to start 12/18 Dispo -ICU Critical Care Total Time*: 34 Minutes  Georganna Skeans, MD, MPH, FACS Trauma & General Surgery Use [email or web address] to contact on call provider  04/22/2020  *Care during the described time interval was provided by me. I have reviewed this patient's available data, including medical history, events of note, physical examination and test results as part of my evaluation.
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
## 2020-04-22 NOTE — Plan of Care (Signed)
  Problem: Nutrition: Goal: Adequate nutrition will be maintained Outcome: Progressing   Problem: Elimination: Goal: Will not experience complications related to urinary retention Outcome: Progressing   Problem: Pain Managment: Goal: General experience of comfort will improve Outcome: Progressing   Problem: Skin Integrity: Goal: Risk for impaired skin integrity will decrease Outcome: Progressing   

## 2020-04-23 ENCOUNTER — Inpatient Hospital Stay (HOSPITAL_COMMUNITY): Payer: Managed Care, Other (non HMO)

## 2020-04-23 LAB — BASIC METABOLIC PANEL
Anion gap: 7 (ref 5–15)
BUN: 22 mg/dL — ABNORMAL HIGH (ref 6–20)
CO2: 24 mmol/L (ref 22–32)
Calcium: 7.6 mg/dL — ABNORMAL LOW (ref 8.9–10.3)
Chloride: 114 mmol/L — ABNORMAL HIGH (ref 98–111)
Creatinine, Ser: 0.64 mg/dL (ref 0.61–1.24)
GFR, Estimated: >60 mL/min (ref >60)
Glucose, Bld: 135 mg/dL — ABNORMAL HIGH (ref 70–99)
Potassium: 4.2 mmol/L (ref 3.5–5.1)
Sodium: 145 mmol/L (ref 135–145)

## 2020-04-23 LAB — TRIGLYCERIDES: Triglycerides: 117 mg/dL (ref <150)

## 2020-04-23 LAB — CBC
HCT: 31 % — ABNORMAL LOW (ref 39.0–52.0)
Hemoglobin: 9.7 g/dL — ABNORMAL LOW (ref 13.0–17.0)
MCH: 28.8 pg (ref 26.0–34.0)
MCHC: 31.3 g/dL (ref 30.0–36.0)
MCV: 92 fL (ref 80.0–100.0)
Platelets: 185 10*3/uL (ref 150–400)
RBC: 3.37 MIL/uL — ABNORMAL LOW (ref 4.22–5.81)
RDW: 16.1 % — ABNORMAL HIGH (ref 11.5–15.5)
WBC: 12.6 10*3/uL — ABNORMAL HIGH (ref 4.0–10.5)
nRBC: 2.1 % — ABNORMAL HIGH (ref 0.0–0.2)

## 2020-04-23 LAB — GLUCOSE, CAPILLARY
Glucose-Capillary: 102 mg/dL — ABNORMAL HIGH (ref 70–99)
Glucose-Capillary: 111 mg/dL — ABNORMAL HIGH (ref 70–99)
Glucose-Capillary: 116 mg/dL — ABNORMAL HIGH (ref 70–99)
Glucose-Capillary: 120 mg/dL — ABNORMAL HIGH (ref 70–99)
Glucose-Capillary: 129 mg/dL — ABNORMAL HIGH (ref 70–99)
Glucose-Capillary: 130 mg/dL — ABNORMAL HIGH (ref 70–99)

## 2020-04-23 NOTE — Progress Notes (Signed)
Orthopaedic Trauma Progress Note  SUBJECTIVE: Intubated/sedated. Wife at bedside who states that he intermittently will open his eyes or squeeze her hand but this is not on command.  OBJECTIVE:  Vitals:   04/23/20 0752 04/23/20 0800  BP:  135/80  Pulse: (!) 108 (!) 102  Resp: (!) 21 18  Temp:  (!) 100.5 F (38.1 C)  SpO2: 97% 100%    General: Intubated/sedated. NAD Respiratory: Mechanically ventilated. No increased work of breathing.  Right Lower Extremity: Incisional vac removed, dressings changed.  All incisions clean, dry, intact.  Tolerates gentle passive dorsiflexion and plantarflexion of his ankle.  Unable to obtain reliable motor or sensory exam.  + DP pulse.  IMAGING: Stable post op imaging.   LABS:  Results for orders placed or performed during the hospital encounter of 04/17/20 (from the past 24 hour(s))  Glucose, capillary     Status: Abnormal   Collection Time: 04/22/20 11:29 AM  Result Value Ref Range   Glucose-Capillary 128 (H) 70 - 99 mg/dL  Glucose, capillary     Status: Abnormal   Collection Time: 04/22/20  3:29 PM  Result Value Ref Range   Glucose-Capillary 116 (H) 70 - 99 mg/dL  Glucose, capillary     Status: Abnormal   Collection Time: 04/22/20  7:41 PM  Result Value Ref Range   Glucose-Capillary 116 (H) 70 - 99 mg/dL  Glucose, capillary     Status: Abnormal   Collection Time: 04/22/20 11:18 PM  Result Value Ref Range   Glucose-Capillary 107 (H) 70 - 99 mg/dL  Glucose, capillary     Status: Abnormal   Collection Time: 04/23/20  3:32 AM  Result Value Ref Range   Glucose-Capillary 130 (H) 70 - 99 mg/dL  CBC     Status: Abnormal   Collection Time: 04/23/20  4:45 AM  Result Value Ref Range   WBC 12.6 (H) 4.0 - 10.5 K/uL   RBC 3.37 (L) 4.22 - 5.81 MIL/uL   Hemoglobin 9.7 (L) 13.0 - 17.0 g/dL   HCT 31.0 (L) 39.0 - 52.0 %   MCV 92.0 80.0 - 100.0 fL   MCH 28.8 26.0 - 34.0 pg   MCHC 31.3 30.0 - 36.0 g/dL   RDW 16.1 (H) 11.5 - 15.5 %   Platelets 185 150  - 400 K/uL   nRBC 2.1 (H) 0.0 - 0.2 %  Basic metabolic panel     Status: Abnormal   Collection Time: 04/23/20  4:45 AM  Result Value Ref Range   Sodium 145 135 - 145 mmol/L   Potassium 4.2 3.5 - 5.1 mmol/L   Chloride 114 (H) 98 - 111 mmol/L   CO2 24 22 - 32 mmol/L   Glucose, Bld 135 (H) 70 - 99 mg/dL   BUN 22 (H) 6 - 20 mg/dL   Creatinine, Ser 0.64 0.61 - 1.24 mg/dL   Calcium 7.6 (L) 8.9 - 10.3 mg/dL   GFR, Estimated >60 >60 mL/min   Anion gap 7 5 - 15  Triglycerides     Status: None   Collection Time: 04/23/20  4:45 AM  Result Value Ref Range   Triglycerides 117 <150 mg/dL  Glucose, capillary     Status: Abnormal   Collection Time: 04/23/20  8:15 AM  Result Value Ref Range   Glucose-Capillary 129 (H) 70 - 99 mg/dL    ASSESSMENT: Adam Bates is a 49 y.o. male, 5 Days Post-Op  s/p Procedure(s): INTRAMEDULLARY (IM) NAIL FEMORAL INTRAMEDULLARY (IM) NAIL TIBIAL  CV/Blood  loss: Acute blood loss anemia, Hgb 9.7 this morning.   PLAN: Weightbearing: NWB RLE Incisional and dressing care:  Change dressings PRN Orthopedic device(s): None  Pain management: per trauma VTE prophylaxis: Heparin, SCDs ID: post op abx completed Foley/Lines: No foley, continue IVFs per trauma Impediments to Fracture Healing: Polytrauma. Vit D level pending, will start supplementation as indicated Dispo: Intubated and sedated. Will need PT/OT once able.  Follow - up plan: 2 weeks  Contact information:  Katha Hamming MD, Patrecia Pace PA-C. After hours and holidays please check [email or web address] for group call information for [facility]   Morganna Styles A. Ricci Barker, PA-C [phone] (office) [email or web address]

## 2020-04-23 NOTE — Progress Notes (Signed)
Patient transported to MRI and back without complications. RN at bedside. 
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
## 2020-04-23 NOTE — Procedures (Signed)
Cortrak  Person Inserting Tube:  King, Ameila Weldon E, RD Tube Type:  Cortrak - 43 inches Tube Location:  Left nare Initial Placement:  Stomach Secured by: Bridle Technique Used to Measure Tube Placement:  Documented cm marking at nare/ corner of mouth Cortrak Secured At:  66 cm    Cortrak Tube Team Note:  Consult received to place a Cortrak feeding tube.   No x-ray is required. RN may begin using tube.   If the tube becomes dislodged please keep the tube and contact the Cortrak team at [email or web address] ([email or web address]) for replacement.  If after hours and replacement cannot be delayed, place a NG tube and confirm placement with an abdominal x-ray.   Christiann Hagerty King, MS, RD, LDN Pager number available on Amion 

## 2020-04-24 LAB — BASIC METABOLIC PANEL
Anion gap: 8 (ref 5–15)
BUN: 23 mg/dL — ABNORMAL HIGH (ref 6–20)
CO2: 23 mmol/L (ref 22–32)
Calcium: 7.6 mg/dL — ABNORMAL LOW (ref 8.9–10.3)
Chloride: 112 mmol/L — ABNORMAL HIGH (ref 98–111)
Creatinine, Ser: 0.61 mg/dL (ref 0.61–1.24)
GFR, Estimated: >60 mL/min (ref >60)
Glucose, Bld: 135 mg/dL — ABNORMAL HIGH (ref 70–99)
Potassium: 4.4 mmol/L (ref 3.5–5.1)
Sodium: 143 mmol/L (ref 135–145)

## 2020-04-24 LAB — CBC
HCT: 30.5 % — ABNORMAL LOW (ref 39.0–52.0)
Hemoglobin: 9.7 g/dL — ABNORMAL LOW (ref 13.0–17.0)
MCH: 29 pg (ref 26.0–34.0)
MCHC: 31.8 g/dL (ref 30.0–36.0)
MCV: 91.3 fL (ref 80.0–100.0)
Platelets: 232 10*3/uL (ref 150–400)
RBC: 3.34 MIL/uL — ABNORMAL LOW (ref 4.22–5.81)
RDW: 16.4 % — ABNORMAL HIGH (ref 11.5–15.5)
WBC: 13.2 10*3/uL — ABNORMAL HIGH (ref 4.0–10.5)
nRBC: 1.1 % — ABNORMAL HIGH (ref 0.0–0.2)

## 2020-04-24 LAB — GLUCOSE, CAPILLARY
Glucose-Capillary: 114 mg/dL — ABNORMAL HIGH (ref 70–99)
Glucose-Capillary: 99 mg/dL (ref 70–99)
Glucose-Capillary: 128 mg/dL — ABNORMAL HIGH (ref 70–99)
Glucose-Capillary: 103 mg/dL — ABNORMAL HIGH (ref 70–99)
Glucose-Capillary: 124 mg/dL — ABNORMAL HIGH (ref 70–99)

## 2020-04-24 LAB — TRIGLYCERIDES: Triglycerides: 109 mg/dL (ref <150)

## 2020-04-24 LAB — VITAMIN D 25 HYDROXY (VIT D DEFICIENCY, FRACTURES): Vit D, 25-Hydroxy: 26.24 ng/mL — ABNORMAL LOW (ref 30–100)

## 2020-04-24 MED ORDER — OXYCODONE HCL 5 MG/5ML PO SOLN
5.0000 mg | ORAL | Status: DC | PRN
  Administered 2020-04-24 – 2020-04-30 (×15): 10 mg
  Filled 2020-04-24 (×16): qty 10

## 2020-04-24 MED ORDER — METHOCARBAMOL 500 MG PO TABS
1000.0000 mg | ORAL_TABLET | Freq: Three times a day (TID) | ORAL | Status: DC
  Filled 2020-04-24: qty 2

## 2020-04-24 MED ORDER — ACETAMINOPHEN 160 MG/5ML PO SOLN
1000.0000 mg | Freq: Four times a day (QID) | ORAL | Status: DC
  Administered 2020-04-24 – 2020-05-09 (×57): 1000 mg
  Filled 2020-04-24 (×60): qty 40.6

## 2020-04-24 MED ORDER — MIDAZOLAM HCL 2 MG/2ML IJ SOLN
1.0000 mg | INTRAMUSCULAR | Status: DC | PRN
  Administered 2020-04-24 – 2020-05-09 (×19): 2 mg via INTRAVENOUS
  Filled 2020-04-24 (×20): qty 2

## 2020-04-24 MED ORDER — METHOCARBAMOL 500 MG PO TABS
1000.0000 mg | ORAL_TABLET | Freq: Three times a day (TID) | ORAL | Status: DC
  Administered 2020-04-24 – 2020-05-09 (×46): 1000 mg
  Filled 2020-04-24 (×45): qty 2

## 2020-04-24 NOTE — Progress Notes (Addendum)
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 Discussed w/ pt's wife, this confirms the clinical diagnosis of DAI. Discussed likely outcomes and that recovery will be slow, if it occurs. From a practical standpoint, I'd recommend proceeding with trach if family would like to - unlikely to regain consciousness and be awake enough to protect his airway in the next week.   Please let the Neurosurgery service know if the patient's family has any concerns or questions, given his poor exam and non-operative pathology of DAI, will round on the patient on an as needed basis.

## 2020-04-24 NOTE — Progress Notes (Signed)
Trauma/Critical Care Follow Up Note  Subjective:    Overnight Issues:   Objective:  Vital signs for last 24 hours: Temp:  [99.2 F (37.3 C)-100.2 F (37.9 C)] 100.2 F (37.9 C) (12/23 1200) Pulse Rate:  [94-109] 109 (12/23 1354) Resp:  [14-23] 15 (12/23 1354) BP: (120-137)/(71-88) 127/78 (12/23 1100) SpO2:  [93 %-98 %] 97 % (12/23 1354) FiO2 (%):  [40 %] 40 % (12/23 1354) Weight:  [110.9 kg] 110.9 kg (12/23 0500)  Hemodynamic parameters for last 24 hours:    Intake/Output from previous day: 12/22 0701 - 12/23 0700 In: 3025.1 [I.V.:1415.1; NG/GT:1610] Out: 1950 [Urine:1950]  Intake/Output this shift: No intake/output data recorded.  Vent settings for last 24 hours: Vent Mode: PSV;CPAP FiO2 (%):  [40 %] 40 % Set Rate:  [20 bmp] 20 bmp Vt Set:  [560 mL] 560 mL PEEP:  [5 cmH20] 5 cmH20 Pressure Support:  [15 cmH20] 15 cmH20 Plateau Pressure:  [12 cmH20-17 cmH20] 17 cmH20  Physical Exam:  Gen: comfortable, no distress Neuro: does not follow commands, spontaneous eye opening on L, but not to stimulus or command HEENT: intubated Neck: c-collar in place CV: RRR Pulm: unlabored breathing, mechanically ventilated Abd: soft, non-tender, midline incision with moderate SS drainage GU: clear, yellow urine  Extr: wwp, no edema   Results for orders placed or performed during the hospital encounter of 04/17/20 (from the past 24 hour(s))  Glucose, capillary     Status: Abnormal   Collection Time: 04/23/20  4:01 PM  Result Value Ref Range   Glucose-Capillary 102 (H) 70 - 99 mg/dL  Glucose, capillary     Status: Abnormal   Collection Time: 04/23/20  8:10 PM  Result Value Ref Range   Glucose-Capillary 111 (H) 70 - 99 mg/dL  Glucose, capillary     Status: Abnormal   Collection Time: 04/23/20 11:30 PM  Result Value Ref Range   Glucose-Capillary 120 (H) 70 - 99 mg/dL  Triglycerides     Status: None   Collection Time: 04/24/20  2:19 AM  Result Value Ref Range    Triglycerides 109 <150 mg/dL  CBC     Status: Abnormal   Collection Time: 04/24/20  2:19 AM  Result Value Ref Range   WBC 13.2 (H) 4.0 - 10.5 K/uL   RBC 3.34 (L) 4.22 - 5.81 MIL/uL   Hemoglobin 9.7 (L) 13.0 - 17.0 g/dL   HCT 56.3 (L) 87.5 - 64.3 %   MCV 91.3 80.0 - 100.0 fL   MCH 29.0 26.0 - 34.0 pg   MCHC 31.8 30.0 - 36.0 g/dL   RDW 32.9 (H) 51.8 - 84.1 %   Platelets 232 150 - 400 K/uL   nRBC 1.1 (H) 0.0 - 0.2 %  Basic metabolic panel     Status: Abnormal   Collection Time: 04/24/20  2:19 AM  Result Value Ref Range   Sodium 143 135 - 145 mmol/L   Potassium 4.4 3.5 - 5.1 mmol/L   Chloride 112 (H) 98 - 111 mmol/L   CO2 23 22 - 32 mmol/L   Glucose, Bld 135 (H) 70 - 99 mg/dL   BUN 23 (H) 6 - 20 mg/dL   Creatinine, Ser 6.60 0.61 - 1.24 mg/dL   Calcium 7.6 (L) 8.9 - 10.3 mg/dL   GFR, Estimated >63 >01 mL/min   Anion gap 8 5 - 15  VITAMIN D 25 Hydroxy (Vit-D Deficiency, Fractures)     Status: Abnormal   Collection Time: 04/24/20  2:19 AM  Result Value Ref Range   Vit D, 25-Hydroxy 26.24 (L) 30 - 100 ng/mL  Glucose, capillary     Status: Abnormal   Collection Time: 04/24/20  3:16 AM  Result Value Ref Range   Glucose-Capillary 114 (H) 70 - 99 mg/dL  Glucose, capillary     Status: None   Collection Time: 04/24/20  7:44 AM  Result Value Ref Range   Glucose-Capillary 99 70 - 99 mg/dL  Glucose, capillary     Status: Abnormal   Collection Time: 04/24/20 11:45 AM  Result Value Ref Range   Glucose-Capillary 128 (H) 70 - 99 mg/dL    Assessment & Plan: The plan of care was discussed with the bedside nurse for the day, Margaretha Sheffield, who is in agreement with this plan and no additional concerns were raised.   Present on Admission: **None**    LOS: 7 days   Additional comments:I reviewed the patient's new clinical lab test results.    and I reviewed the patients new imaging test results.    [facility]  TBISAH,IPH- NSGY c/s, Dr. Reatha Armour, stable head CT, hold keppra in light of intra-op  rash of unclear etiology after admin, MRI yest with g2 DAI, expecting if recovery occurs that it will be weeks to months. Facial fxs- ENT c/s, Dr. Constance Holster, non-op management Multiple L rib fxs (5-11, 4-7) with PTX, b/l pulm contusions- IS, pulm toilet, pain control, weaning vent VDRF- continuewean, not tolerated well this AM. Expecting no ability to extubate soon and will need trach/PEG. Discussed with wife this AM, plan for 12/24 vs 12/28. Hemoperitoneum- s/p exlap with repair of SB mesenteric laceration x2 by Dr. Redmond Pulling, also noted mesenteric hematoma ComminutedR femur fx - Ortho c/s, Dr. Doreatha Martin, to OR 12/17 Open R tib/fib fxs- Ortho c/s, Dr. Doreatha Martin, to OR 12/17, s/p ancef in TB, but transitioned to levaquin after intra-op rash of unclear etiology after admin ABLA-hct stable FEN - continue tube feeds DVT - SCDs,SQHOKby NSGY Dispo -ICU  Critical Care Total Time: 50 minutes  Jesusita Oka, MD Trauma & General Surgery Please use [email or web address] to contact on call provider  04/24/2020  *Care during the described time interval was provided by me. I have reviewed this patient's available data, including medical history, events of note, physical examination and test results as part of my evaluation.

## 2020-04-25 ENCOUNTER — Encounter (HOSPITAL_COMMUNITY): Admission: EM | Disposition: A | Discharge status: Rehabilitation Facility | Payer: Self-pay | Source: Home / Self Care

## 2020-04-25 ENCOUNTER — Inpatient Hospital Stay (HOSPITAL_COMMUNITY): Payer: Managed Care, Other (non HMO)

## 2020-04-25 ENCOUNTER — Inpatient Hospital Stay (HOSPITAL_COMMUNITY): Payer: Managed Care, Other (non HMO) | Admitting: Certified Registered Nurse Anesthetist

## 2020-04-25 HISTORY — PX: PEG PLACEMENT: SHX5437

## 2020-04-25 HISTORY — PX: TRACHEOSTOMY TUBE PLACEMENT: SHX814

## 2020-04-25 LAB — GLUCOSE, CAPILLARY
Glucose-Capillary: 101 mg/dL — ABNORMAL HIGH (ref 70–99)
Glucose-Capillary: 108 mg/dL — ABNORMAL HIGH (ref 70–99)
Glucose-Capillary: 109 mg/dL — ABNORMAL HIGH (ref 70–99)
Glucose-Capillary: 138 mg/dL — ABNORMAL HIGH (ref 70–99)
Glucose-Capillary: 96 mg/dL (ref 70–99)

## 2020-04-25 SURGERY — CREATION, TRACHEOSTOMY
Anesthesia: General | Site: Neck

## 2020-04-25 MED ORDER — PHENYLEPHRINE HCL-NACL 10-0.9 MG/250ML-% IV SOLN
INTRAVENOUS | Status: DC | PRN
  Administered 2020-04-25: 40 ug/min via INTRAVENOUS

## 2020-04-25 MED ORDER — ROCURONIUM BROMIDE 10 MG/ML (PF) SYRINGE
PREFILLED_SYRINGE | INTRAVENOUS | Status: DC | PRN
  Administered 2020-04-25 (×2): 50 mg via INTRAVENOUS

## 2020-04-25 MED ORDER — MIDAZOLAM HCL 2 MG/2ML IJ SOLN
INTRAMUSCULAR | Status: AC
  Filled 2020-04-25: qty 2

## 2020-04-25 MED ORDER — DEXAMETHASONE SODIUM PHOSPHATE 10 MG/ML IJ SOLN
INTRAMUSCULAR | Status: AC
  Filled 2020-04-25: qty 1

## 2020-04-25 MED ORDER — BUPIVACAINE HCL (PF) 0.25 % IJ SOLN
INTRAMUSCULAR | Status: AC
  Filled 2020-04-25: qty 30

## 2020-04-25 MED ORDER — DEXAMETHASONE SODIUM PHOSPHATE 10 MG/ML IJ SOLN
INTRAMUSCULAR | Status: DC | PRN
  Administered 2020-04-25: 10 mg via INTRAVENOUS

## 2020-04-25 MED ORDER — FENTANYL CITRATE (PF) 250 MCG/5ML IJ SOLN
INTRAMUSCULAR | Status: DC | PRN
  Administered 2020-04-25 (×3): 50 ug via INTRAVENOUS

## 2020-04-25 MED ORDER — MIDAZOLAM HCL 2 MG/2ML IJ SOLN
INTRAMUSCULAR | Status: DC | PRN
  Administered 2020-04-25: 2 mg via INTRAVENOUS

## 2020-04-25 MED ORDER — LACTATED RINGERS IV SOLN: INTRAVENOUS | Status: DC | PRN

## 2020-04-25 MED ORDER — ONDANSETRON HCL 4 MG/2ML IJ SOLN
INTRAMUSCULAR | Status: DC | PRN
  Administered 2020-04-25: 4 mg via INTRAVENOUS

## 2020-04-25 MED ORDER — PROPOFOL 10 MG/ML IV BOLUS
INTRAVENOUS | Status: AC
  Filled 2020-04-25: qty 20

## 2020-04-25 MED ORDER — FENTANYL CITRATE (PF) 250 MCG/5ML IJ SOLN
INTRAMUSCULAR | Status: AC
  Filled 2020-04-25: qty 5

## 2020-04-25 MED ORDER — ONDANSETRON HCL 4 MG/2ML IJ SOLN
INTRAMUSCULAR | Status: AC
  Filled 2020-04-25: qty 2

## 2020-04-25 MED ORDER — ROCURONIUM BROMIDE 10 MG/ML (PF) SYRINGE
PREFILLED_SYRINGE | INTRAVENOUS | Status: AC
  Filled 2020-04-25: qty 10

## 2020-04-25 MED ORDER — 0.9 % SODIUM CHLORIDE (POUR BTL) OPTIME
TOPICAL | Status: DC | PRN
  Administered 2020-04-25: 1000 mL

## 2020-04-25 SURGICAL SUPPLY — 28 items
BLADE CLIPPER SURG (BLADE) IMPLANT
CANISTER SUCT 3000ML PPV (MISCELLANEOUS) ×3 IMPLANT
COVER SURGICAL LIGHT HANDLE (MISCELLANEOUS) ×3 IMPLANT
COVER WAND RF STERILE (DRAPES) ×2 IMPLANT
DRAPE UTILITY XL STRL (DRAPES) ×3 IMPLANT
ELECT CAUTERY BLADE 6.4 (BLADE) ×3 IMPLANT
ELECT REM PT RETURN 9FT ADLT (ELECTROSURGICAL) ×3
ELECTRODE REM PT RTRN 9FT ADLT (ELECTROSURGICAL) ×2 IMPLANT
GAUZE 4X4 16PLY RFD (DISPOSABLE) ×3 IMPLANT
GLOVE BIO SURGEON STRL SZ 6.5 (GLOVE) ×8 IMPLANT
GLOVE BIOGEL PI IND STRL 6 (GLOVE) ×2 IMPLANT
GLOVE BIOGEL PI INDICATOR 6 (GLOVE) ×1
GOWN STRL REUS W/ TWL LRG LVL3 (GOWN DISPOSABLE) ×4 IMPLANT
GOWN STRL REUS W/TWL LRG LVL3 (GOWN DISPOSABLE) ×6
INTRODUCER TRACH BLUE RHINO 6F (TUBING) ×1 IMPLANT
INTRODUCER TRACH BLUE RHINO 8F (TUBING) IMPLANT
KIT BASIN OR (CUSTOM PROCEDURE TRAY) ×3 IMPLANT
KIT TURNOVER KIT B (KITS) ×3 IMPLANT
NS IRRIG 1000ML POUR BTL (IV SOLUTION) ×3 IMPLANT
PACK EENT II TURBAN DRAPE (CUSTOM PROCEDURE TRAY) ×3 IMPLANT
PAD ARMBOARD 7.5X6 YLW CONV (MISCELLANEOUS) ×6 IMPLANT
PENCIL BUTTON HOLSTER BLD 10FT (ELECTRODE) ×3 IMPLANT
SPONGE INTESTINAL PEANUT (DISPOSABLE) ×3 IMPLANT
SUT VICRYL AB 3 0 TIES (SUTURE) ×3 IMPLANT
SYR 20ML LL LF (SYRINGE) ×3 IMPLANT
TOWEL GREEN STERILE (TOWEL DISPOSABLE) ×3 IMPLANT
TOWEL GREEN STERILE FF (TOWEL DISPOSABLE) ×3 IMPLANT
TUBE CONNECTING 12X1/4 (SUCTIONS) ×3 IMPLANT
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
## 2020-04-25 NOTE — Progress Notes (Signed)
04/25/20 0745  Airway 8 mm  Placement Date/Time: 04/17/20 2115   Grade View: Grade 1  Placed By: ED Physician  Airway Device: Endotracheal Tube  Laryngoscope Blade: 3  ETT Types: Oral  Size (mm): 8 mm  Cuffed: Cuffed  Insertion attempts: 1  Airway Equipment: Stylet;Video Laryngo...  Secured at (cm) 25 cm  Measured From Lips  Secured Location Left  Secured By Commercial Tube Holder  Tube Holder Repositioned Yes  Prone position No  Cuff Pressure (cm H2O) 30 cm H2O  Site Condition Dry  Adult Ventilator Settings  Vent Type Servo i  Humidity HME  Vent Mode PSV;CPAP  FiO2 (%) 40 %  I Time 0.8 Sec(s)  Pressure Support 5 cmH20  PEEP 5 cmH20  Adult Ventilator Measurements  Peak Airway Pressure 11 L/min  Mean Airway Pressure 6 cmH20  Resp Rate Spontaneous 15 br/min  Resp Rate Total 15 br/min  Spont TV 736 mL  Measured Ve 9.3 mL  Auto PEEP 0 cmH20  Total PEEP 5 cmH20  SpO2 96 %  Adult Ventilator Alarms  Alarms On Y  Ve High Alarm 18 L/min  Ve Low Alarm 4 L/min  Resp Rate High Alarm 38 br/min  Resp Rate Low Alarm 8  PEEP Low Alarm 3 cmH2O  Press High Alarm 40 cmH2O  T Apnea 20 sec(s)  VAP Prevention  Cuff pressure (initial) 30 cm H2O  Cuff pressure after change 30 cm H2O  HME changed No  Ventilator changed No  HOB> 30 Degrees Y  Equipment wiped down Yes  Daily Weaning Assessment  Daily Assessment of Readiness to Wean Wean protocol criteria met (SBT performed)  SBT Method CPAP 5 cm H20 and PS 5 cm H20  Weaning Start Time 0745  Patient response Passed (Tolerated well)  Breath Sounds  Bilateral Breath Sounds Diminished  Airway Suctioning/Secretions  Suction Type ETT  Suction Device  Catheter  Secretion Amount Moderate  Secretion Color Yellow  Secretion Consistency Thick  Suction Tolerance Tolerated well  Suctioning Adverse Effects None   

## 2020-04-25 NOTE — Transfer of Care (Signed)
Immediate Anesthesia Transfer of Care Note  Patient: Adam Bates  Procedure(s) Performed: TRACHEOSTOMY (N/A Neck) PERCUTANEOUS ENDOSCOPIC GASTROSTOMY (PEG) PLACEMENT (N/A Abdomen)  Patient Location: ICU  Anesthesia Type:General  Level of Consciousness: Patient remains intubated per anesthesia plan  Airway & Oxygen Therapy: Patient remains intubated per anesthesia plan and Patient placed on Ventilator (see vital sign flow sheet for setting)  Post-op Assessment: Report given to RN and Post -op Vital signs reviewed and stable  Post vital signs: Reviewed and stable  Last Vitals:  Vitals Value Taken Time  BP 122/83 04/25/20 1400  Temp    Pulse 96 04/25/20 1400  Resp 20 04/25/20 1400  SpO2 95 % 04/25/20 1400  Vitals shown include unvalidated device data.  Last Pain:  Vitals:   04/25/20 1200  TempSrc: Axillary  PainSc:          Complications: No complications documented.

## 2020-04-25 NOTE — Op Note (Signed)
Operative Note  Date: 04/25/2020  Procedure: percutaneous tracheostomy with bronchoscopic assistance, percutaneous endoscopic gastrostomy tube placement  Pre-op diagnosis: prolonged mechanical ventilation, dysphagia Post-op diagnosis: same  Indication and clinical history: The patient  is a 49 y.o. year old male with prolonged mechanical ventilation, dysphagia  Surgeon: Jesusita Oka, MD  Anesthesia: general   Findings:  . Specimen: none . EBL: <5cc  . Drains/Implants: #6 Shiley cuffed tracheostomy tube, PEG tube, 4cm at the skin  Disposition: ICU  Description of procedure: The patient was positioned supine with a shoulder roll. Time-out was performed verifying correct patient, procedure, signature of informed consent, and pre-operative antibiotics as indicated. MAC induction was uneventful and the patient was confirmed to be on 100% FiO2. The neck was prepped and draped in the usual sterile fashion. Palpation of neck anatomy was performed. A longitudinal incision was made in the neck and deepened down to the trachea.   The pilot balloon was deflated and the endotracheal tube slowly retracted proximally until the tip of the endotracheal tube was palpated just below the cricoid cartilage. An introducer needle was inserted between the second and third tracheal rings. A guidewire was passed through the introducer needle and the needle removed. Serial dilation was performed and a #6 cuffed Shiley and stylet were inserted over the guidewire and the guidewire and stylet removed. The inner cannula was inserted, the pilot balloon on the tracheostomy inflated, and the ventilator tubing disconnected from the endotracheal tube and connected to the tracheostomy. Initially, return tidal volumes were inadequate and a bronchoscope was inserted into the tracheostomy, confirming intra-tracheal placement above the carina. After removal of the bronchoscope, chest rise, appropriate end-tidal CO2, and  return tidal volumes were confirmed. The tracheostomy tube was sutured in four quadrants and a tracheostomy tie applied to the neck. The endotracheal tube was removed. The patient tolerated the procedure well. There were no complications. Post-procedure chest x-ray was ordered to confirm tube position and the absence of a pneumothorax.  The patient was re-positioned to slight reverse Trendelenburg. Time-out was performed verifying correct patient, procedure, signature of informed consent, and pre-operative antibiotics as indicated. A bite block was placed into the oropharynx. The endoscope was inserted into the oropharynx and advanced down the esophagus into the stomach and into the duodenum. The visualized esophagus and duodenum were unremarkable. The endoscope was retracted back into the stomach and the stomach was insufflated. The stomach was inspected and was also normal. Transillumination was performed. The light was visible on the external skin and dimpling of the stomach was noted endoscopically with manual pressure. The abdomen was prepped and draped in the usual sterile fashion. Transillumination and dimpling were repeated and local anesthetic was infiltrated to make a skin wheal at the site of transillumination. The needle was inserted perpendicularly to the skin and the tip of the needle was visualized endoscopically. As the needle was retracted, the tract was also anesthetized. A skin nick was made at the site of the wheal and an introducer needle and sheath were inserted. The needle was removed and guidewire inserted. The guidewire was grasped by an endoscopic snare and the snare, guidewire, and endoscope retracted out of the oropharynx. The PEG tube was secured to the guidewire and retracted through the mouth and esophagus into the stomach. The PEG tube was secured with a bolster and was visualized endoscopically to spin freely circumferentially and also be without gaps between the internal bumper  and the stomach wall. There was no evidence of bleeding.  The PEG bolster was secured at 4cm at the skin and there were no gaps between the bolster and the abdominal wall. The stomach was desufflated endoscopically and the endoscope removed. The bite block was also removed. The patient tolerated the procedure well and there were no complications.   The patient may have water and medications administered via the PEG tube beginning immediately and tube feeds may be initiated four hours post-procedure.    Jesusita Oka, MD General and [facility]

## 2020-04-25 NOTE — Anesthesia Preprocedure Evaluation (Addendum)
Anesthesia Evaluation  Patient identified by MRN, date of birth, ID band Patient unresponsive    Reviewed: Allergy & Precautions, NPO status , Patient's Chart, lab work & pertinent test results  Airway Mallampati: Intubated      Comment: Unable to assess 2/2 pt intubated and sedated Dental   Unable to assess 2/2 pt intubated and sedated:   Pulmonary neg pulmonary ROS,  Intubated PSV, 40%, 5/5   breath sounds clear to auscultation       Cardiovascular negative cardio ROS   Rhythm:Regular Rate:Normal     Neuro/Psych PSYCHIATRIC DISORDERS Depression negative neurological ROS     GI/Hepatic negative GI ROS, Neg liver ROS,   Endo/Other  negative endocrine ROS  Renal/GU negative Renal ROS  negative genitourinary   Musculoskeletal negative musculoskeletal ROS (+)   Abdominal (+)  Abdomen: soft.    Peds  (+) ADHD Hematology  (+) Blood dyscrasia, anemia ,   Anesthesia Other Findings Level 1 trauma after [facility] on 04/17/20. Injuries include B/l SAH, intraparenchymal hemorrhage Right eyebrow laceration Facial fxs Multiple L rib fxs (5-11, 4-7) with PTX B/l pulm contusions Hemoperitoneum Multiple R femur fxs Open Right tib/fib fxs  s/p exlap with repair of SB mesenteric laceration x2 04/17/20, IM nail tib/fib 04/18/20 chronic respiratory failure here for trach/PEG  Reproductive/Obstetrics                           Anesthesia Physical  Anesthesia Plan  ASA: IV  Anesthesia Plan: General   Post-op Pain Management:    Induction: Inhalational and Intravenous  PONV Risk Score and Plan: 2 and Midazolam, Dexamethasone and Ondansetron  Airway Management Planned: Oral ETT  Additional Equipment: None  Intra-op Plan:   Post-operative Plan: Post-operative intubation/ventilation  Informed Consent: I have reviewed the patients History and Physical, chart, labs and discussed the procedure  including the risks, benefits and alternatives for the proposed anesthesia with the patient or authorized representative who has indicated his/her understanding and acceptance.     Dental advisory given  Plan Discussed with: CRNA  Anesthesia Plan Comments: (Lab Results      Component                Value               Date                      WBC                      13.2 (H)            04/24/2020                HGB                      9.7 (L)             04/24/2020                HCT                      30.5 (L)            04/24/2020                MCV                      91.3  04/24/2020                PLT                      232                 04/24/2020           Lab Results      Component                Value               Date                      NA                       143                 04/24/2020                K                        4.4                 04/24/2020                CO2                      23                  04/24/2020                GLUCOSE                  135 (H)             04/24/2020                BUN                      23 (H)              04/24/2020                CREATININE               0.61                04/24/2020                CALCIUM                  7.6 (L)             04/24/2020                GFRNONAA                 >60                 04/24/2020          )       Anesthesia Quick Evaluation

## 2020-04-26 LAB — GLUCOSE, CAPILLARY
Glucose-Capillary: 113 mg/dL — ABNORMAL HIGH (ref 70–99)
Glucose-Capillary: 124 mg/dL — ABNORMAL HIGH (ref 70–99)
Glucose-Capillary: 126 mg/dL — ABNORMAL HIGH (ref 70–99)
Glucose-Capillary: 136 mg/dL — ABNORMAL HIGH (ref 70–99)
Glucose-Capillary: 137 mg/dL — ABNORMAL HIGH (ref 70–99)
Glucose-Capillary: 154 mg/dL — ABNORMAL HIGH (ref 70–99)

## 2020-04-26 MED ORDER — FENTANYL 2500MCG IN NS 250ML (10MCG/ML) PREMIX INFUSION
50.0000 ug/h | INTRAVENOUS | Status: AC
  Administered 2020-04-27: 150 ug/h via INTRAVENOUS
  Filled 2020-04-26: qty 250

## 2020-04-26 NOTE — Progress Notes (Signed)
SLP Cancellation Note  Patient Details Name: Adam Bates MRN: [id number] DOB: 11-May-1970   Cancelled treatment:       Reason Eval/Treat Not Completed: Other (comment) Patient with new tracheostomy on previous date. Orders for SLP eval and treat for PMSV and swallowing received. Will follow pt closely for readiness for SLP interventions as appropriate.     Osie Bond., M.A. [place] Acute Rehabilitation Services Pager [phone] Office [phone]  04/26/2020, 7:18 AM

## 2020-04-26 NOTE — Progress Notes (Signed)
Trauma/Critical Care Follow Up Note  Subjective:    Overnight Issues:   Objective:  Vital signs for last 24 hours: Temp:  [97.4 F (36.3 C)-99.8 F (37.7 C)] 97.4 F (36.3 C) (12/25 0000) Pulse Rate:  [83-118] 100 (12/25 0300) Resp:  [14-20] 20 (12/25 0300) BP: (106-147)/(66-87) 125/71 (12/25 0300) SpO2:  [95 %-98 %] 96 % (12/25 0300) FiO2 (%):  [40 %-50 %] 50 % (12/25 0107) Weight:  HH:117611 kg] 111 kg (12/25 0500)  Hemodynamic parameters for last 24 hours:    Intake/Output from previous day: 12/24 0701 - 12/25 0700 In: 741.8 [I.V.:741.8] Out: 1130 [Urine:1100; Blood:30]  Intake/Output this shift: Total I/O In: 241.8 [I.V.:241.8] Out: 750 [Urine:750]  Vent settings for last 24 hours: Vent Mode: PRVC FiO2 (%):  [40 %-50 %] 50 % Set Rate:  [20 bmp] 20 bmp Vt Set:  [560 mL] 560 mL PEEP:  [5 cmH20] 5 cmH20 Pressure Support:  [5 cmH20] 5 cmH20 Plateau Pressure:  [16 cmH20-17 cmH20] 17 cmH20  Physical Exam:  Gen: comfortable, no distress Neuro: does not follow commands, moves LUE spontanoeusly HEENT: trached CV: RRR Pulm: unlabored breathing, mechanically ventilated Abd: soft, PEG at 4 at the skin GU: clear, yellow urine Extr: wwp, no edema   Results for orders placed or performed during the hospital encounter of 04/17/20 (from the past 24 hour(s))  Glucose, capillary     Status: None   Collection Time: 04/25/20  7:41 AM  Result Value Ref Range   Glucose-Capillary 96 70 - 99 mg/dL  Glucose, capillary     Status: Abnormal   Collection Time: 04/25/20 12:09 PM  Result Value Ref Range   Glucose-Capillary 101 (H) 70 - 99 mg/dL  Glucose, capillary     Status: Abnormal   Collection Time: 04/25/20  3:54 PM  Result Value Ref Range   Glucose-Capillary 108 (H) 70 - 99 mg/dL  Glucose, capillary     Status: Abnormal   Collection Time: 04/25/20  9:29 PM  Result Value Ref Range   Glucose-Capillary 113 (H) 70 - 99 mg/dL    Assessment & Plan:  Present on  Admission: **None**    LOS: 9 days   Additional comments:I reviewed the patient's new clinical lab test results.   and I reviewed the patients new imaging test results.    [facility]  TBISAH,IPH- NSGY c/s, Dr. Reatha Armour, stable head CT, hold keppra in light of intra-op rash of unclear etiology after admin, MRIyest with g2 DAI, expecting if recovery occurs that it will be weeks to months. Facial fxs- ENT c/s, Dr. Constance Holster, non-op management Multiple L rib fxs (5-11, 4-7) with PTX, b/l pulm contusions- IS, pulm toilet, pain control,weaning vent VDRF- continuewean, PSV/TC as tolerated. Trach 12/24 Hemoperitoneum- s/p exlap with repair of SB mesenteric laceration x2 by Dr. Redmond Pulling, also noted mesenteric hematoma ComminutedR femur fx - Ortho c/s, Dr. Doreatha Martin, to OR 12/17 Open R tib/fib fxs- Ortho c/s, Dr. Doreatha Martin, to OR 12/17, s/p ancef in TB, but transitioned to levaquin after intra-op rash of unclear etiology after admin ABLA-hct stable FEN - restart tube feeds, PEG 12/24, abdominal binder DVT - SCDs,SQHOKby NSGY Dispo -ICU, start PT/OT/SLP, begin dispo planning  Critical Care Total Time: 35 minutes  Jesusita Oka, MD Trauma & General Surgery Please use [email or web address] to contact on call provider  04/26/2020  *Care during the described time interval was provided by me. I have reviewed this patient's available data, including medical history, events of note, physical examination and test  results as part of my evaluation.

## 2020-04-27 LAB — GLUCOSE, CAPILLARY
Glucose-Capillary: 127 mg/dL — ABNORMAL HIGH (ref 70–99)
Glucose-Capillary: 131 mg/dL — ABNORMAL HIGH (ref 70–99)
Glucose-Capillary: 133 mg/dL — ABNORMAL HIGH (ref 70–99)
Glucose-Capillary: 147 mg/dL — ABNORMAL HIGH (ref 70–99)
Glucose-Capillary: 128 mg/dL — ABNORMAL HIGH (ref 70–99)
Glucose-Capillary: 121 mg/dL — ABNORMAL HIGH (ref 70–99)

## 2020-04-27 MED ORDER — QUETIAPINE FUMARATE 25 MG PO TABS
50.0000 mg | ORAL_TABLET | Freq: Two times a day (BID) | ORAL | Status: DC
Start: 2020-04-27 — End: 2020-04-28
  Administered 2020-04-27 – 2020-04-28 (×3): 50 mg via ORAL
  Filled 2020-04-27 (×3): qty 2

## 2020-04-27 MED ORDER — DEXMEDETOMIDINE HCL IN NACL 400 MCG/100ML IV SOLN
0.4000 ug/kg/h | INTRAVENOUS | Status: DC
  Administered 2020-04-27: 0.4 ug/kg/h via INTRAVENOUS
  Administered 2020-04-27: 0.6 ug/kg/h via INTRAVENOUS
  Administered 2020-04-28: 0.8 ug/kg/h via INTRAVENOUS
  Administered 2020-04-28: 1 ug/kg/h via INTRAVENOUS
  Administered 2020-04-28: 0.761 ug/kg/h via INTRAVENOUS
  Administered 2020-04-28 (×2): 0.8 ug/kg/h via INTRAVENOUS
  Administered 2020-04-29 (×2): 1.1 ug/kg/h via INTRAVENOUS
  Administered 2020-04-29: 1 ug/kg/h via INTRAVENOUS
  Administered 2020-04-30: 0.6 ug/kg/h via INTRAVENOUS
  Filled 2020-04-27 (×15): qty 100

## 2020-04-27 MED ORDER — MORPHINE SULFATE (PF) 2 MG/ML IV SOLN
2.0000 mg | INTRAVENOUS | Status: DC | PRN
  Administered 2020-04-29: 2 mg via INTRAVENOUS
  Administered 2020-04-29 (×2): 4 mg via INTRAVENOUS
  Filled 2020-04-27: qty 2
  Filled 2020-04-27: qty 1
  Filled 2020-04-27: qty 2

## 2020-04-27 NOTE — Plan of Care (Signed)
  Problem: Nutrition: Goal: Adequate nutrition will be maintained Outcome: Progressing   

## 2020-04-27 NOTE — Progress Notes (Signed)
Inpatient Rehab Admissions Coordinator Note:   Per therapy recommendations, pt was screened for CIR candidacy by Clemens Catholic, [place] CCC-SLP. At this time, Pt. Appears to have functional decline but unable to tolerate CIR level therapies. I will hold on placing CIR consult order for now, but [facility] team will follow for progress once liberated from vent and able to participate more with therapies.  Clemens Catholic, [place], [place] Admissions Coordinator  [phone] ([place]) [phone] (office)

## 2020-04-27 NOTE — Progress Notes (Signed)
   04/27/20 1700  Vent Select  Invasive or Noninvasive Invasive (Placed vent on standby, initiated ATC 40% per MD request, trauma attending, SP02 96%, RR 23, HR 88.)  Adult Vent Y

## 2020-04-27 NOTE — Progress Notes (Signed)
Trauma/Critical Care Follow Up Note  Subjective:    Overnight Issues:   Objective:  Vital signs for last 24 hours: Temp:  [99.9 F (37.7 C)-101 F (38.3 C)] 101 F (38.3 C) (12/26 0749) Pulse Rate:  [93-125] 102 (12/26 0800) Resp:  [13-26] 20 (12/26 0800) BP: (113-164)/(68-91) 130/76 (12/26 0800) SpO2:  [95 %-97 %] 96 % (12/26 0841) FiO2 (%):  [40 %] 40 % (12/26 0841) Weight:  [105.1 kg] 105.1 kg (12/26 0500)  Hemodynamic parameters for last 24 hours:    Intake/Output from previous day: 12/25 0701 - 12/26 0700 In: 2777.1 [I.V.:432.1; NG/GT:2345] Out: 2400 [Urine:2400]  Intake/Output this shift: Total I/O In: 70 [NG/GT:70] Out: -   Vent settings for last 24 hours: Vent Mode: PRVC FiO2 (%):  [40 %] 40 % Set Rate:  [3.2 bmp-20 bmp] 20 bmp Vt Set:  [560 mL] 560 mL PEEP:  [5 cmH20] 5 cmH20 Plateau Pressure:  [16 cmH20-17 cmH20] 16 cmH20  Physical Exam:  Gen: comfortable, no distress Neuro: grossly non-focal, does not follow commands for me, but nurse reports he intermittently sticks out his tongue and squeezes L hand HEENT: intubated Neck: supple CV: RRR Pulm: unlabored breathing, mechanically ventilated Abd: soft, nontender, PEG in place at 4 at the skin, incision c/d/i with staples, abdominal binder in place GU: clear, yellow urine, primafit in place Extr: wwp, no edema   Results for orders placed or performed during the hospital encounter of 04/17/20 (from the past 24 hour(s))  Glucose, capillary     Status: Abnormal   Collection Time: 04/26/20 11:47 AM  Result Value Ref Range   Glucose-Capillary 126 (H) 70 - 99 mg/dL  Glucose, capillary     Status: Abnormal   Collection Time: 04/26/20  3:59 PM  Result Value Ref Range   Glucose-Capillary 154 (H) 70 - 99 mg/dL  Glucose, capillary     Status: Abnormal   Collection Time: 04/26/20  7:52 PM  Result Value Ref Range   Glucose-Capillary 137 (H) 70 - 99 mg/dL  Glucose, capillary     Status: Abnormal    Collection Time: 04/26/20 11:37 PM  Result Value Ref Range   Glucose-Capillary 136 (H) 70 - 99 mg/dL  Glucose, capillary     Status: Abnormal   Collection Time: 04/27/20  3:59 AM  Result Value Ref Range   Glucose-Capillary 127 (H) 70 - 99 mg/dL  Glucose, capillary     Status: Abnormal   Collection Time: 04/27/20  7:48 AM  Result Value Ref Range   Glucose-Capillary 131 (H) 70 - 99 mg/dL    Assessment & Plan: The plan of care was discussed with the bedside nurse for the day, Marissa, who is in agreement with this plan and no additional concerns were raised.   Present on Admission: **None**    LOS: 10 days   Additional comments:I reviewed the patient's new clinical lab test results.   and I reviewed the patients new imaging test results.    [facility]  TBISAH,IPH- NSGY c/s, Dr. Reatha Armour, stable head CT, hold keppra in light of intra-op rash of unclear etiology after admin, MRIyest with g2 DAI, expecting if recovery occurs that it will be weeks to months. Intermittently f/c this AM Facial fxs- ENT c/s, Dr. Constance Holster, non-op management Multiple L rib fxs (5-11, 4-7) with PTX, b/l pulm contusions- IS, pulm toilet, pain control,weaning vent VDRF- continuewean,PSV/TC as tolerated. Trach 12/24 Hemoperitoneum- s/p exlap with repair of SB mesenteric laceration x2 by Dr. Redmond Pulling, also noted mesenteric  hematoma ComminutedR femur fx - Ortho c/s, Dr. Doreatha Martin, to OR 12/17 Open R tib/fib fxs- Ortho c/s, Dr. Doreatha Martin, to OR 12/17, s/p ancef in TB, but transitioned to levaquin after intra-op rash of unclear etiology after admin ABLA-hct stable FEN - restart tube feeds, PEG 12/24, abdominal binder DVT - SCDs,SQHOKby NSGY Dispo -ICU, start PT/OT/SLP, begin dispo planning  Critical Care Total Time: 40 minutes  Jesusita Oka, MD Trauma & General Surgery Please use [email or web address] to contact on call provider  04/27/2020  *Care during the described time interval was provided by me. I have  reviewed this patient's available data, including medical history, events of note, physical examination and test results as part of my evaluation.

## 2020-04-27 NOTE — Evaluation (Signed)
Physical Therapy Evaluation Patient Details Name: Adam Bates MRN: [id number] DOB: 08/07/1970 Today's Date: 04/27/2020   History of Present Illness  Pt is a 49yo male s/p [facility] who sustained TBI, SAH, IPH with DAI. facial fx -no-op, L rib fx 5-11, 4-7 with PTX and bilat pulm contusions, s/pe xlap with repair of SB mesenteric lac x2 and, comminuted R femur fx and R tib/fib fracture - s/p ORIF. R LE NWB.  Pt intubated. Pt trached and pegged on 12/24. 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    Clinical Impression  Pt admitted with above. Pt very restless upon arrival on max fentyl. RN decreased fentyl and pt assisted to EOB with totalAx2 and RN managing lines/vent and pt immediately calmed down, RR decreased and pts HR maintained 110s. Pt no longer restless. Pt with spontaneous movement of L UE and occasional purposeful to reach for trach or scratch nose. Minimal movement of L LE and R UE, no movement of R LE. Pt only followed "squeeze my hand" on R UE x1 otherwise no command follow. Pt grimaced x4 extremities with noxious stimuli. At this time pt is total assist for all mobility and ADLs. Pt presenting with characteristics of Rancho Level II at this time. Spoke extensively with spouse regarding TBI recovery and CIR, TBI book given. Spouse appreciative of services. Pt sleeping and calm once returned to supine in bed. Suggested to RN staff to place pt in chair position more frequently when pt getting restless as it appears he prefers to move. Acute PT to cont to follow.    Follow Up Recommendations CIR    Equipment Recommendations   (TBD)    Recommendations for Other Services Rehab consult     Precautions / Restrictions Precautions Precautions: Fall Precaution Comments: TBI, peg, rectal pouch, male purwick, trach Required Braces or Orthoses: Other Brace Other Brace: PRAFO on R foot Restrictions Weight Bearing Restrictions: Yes RLE Weight Bearing: Non weight  bearing      Mobility  Bed Mobility Overal bed mobility: Needs Assistance Bed Mobility: Rolling;Supine to Sit;Sit to Supine Rolling: Total assist;+2 for physical assistance   Supine to sit: Total assist;+2 for physical assistance Sit to supine: Total assist;+2 for physical assistance   General bed mobility comments: totalAx2 to roll L/R, HOB elevated and did helicopter transfer to EOB, dependent for trunk and LE management to EOB and return to supine, no active participation    Transfers                 General transfer comment: unable  Ambulation/Gait             General Gait Details: unable  Stairs            Wheelchair Mobility    Modified Rankin (Stroke Patients Only)       Balance Overall balance assessment: Needs assistance Sitting-balance support: Feet supported;Bilateral upper extremity supported Sitting balance-Leahy Scale: Zero Sitting balance - Comments: dependent on total posterior assist, pt requiring maxA to hold head up, pt held it up spontaneously for <10 sec before resting in L rotation and flexion. pt did toelrate EOB x10 and calmed down significantly. wife hugged ptx2, max assist for pt to place UEs around wife Postural control: Posterior lean;Left lateral lean                                   Pertinent Vitals/Pain  Pain Assessment: Faces Faces Pain Scale: Hurts little more Pain Location: generalized with movement, grimaced to noxious stimuli Pain Descriptors / Indicators: Grimacing Pain Intervention(s): Monitored during session    Home Living Family/patient expects to be discharged to:: Private residence Living Arrangements: Spouse/significant other;Children Available Help at Discharge: Family;Available 24 hours/day Type of Home: House Home Access: Ramped entrance     Home Layout: Two level;Able to live on main level with bedroom/bathroom Home Equipment: None      Prior Function Level of Independence:  Independent         Comments: 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, research (life sciences) at [employer]   Dominant Hand: Right    Extremity/Trunk Assessment   Upper Extremity Assessment Upper Extremity Assessment: RUE deficits/detail;LUE deficits/detail RUE Deficits / Details: minimal spontaneous movement, did squeeze hand to command RUE Coordination: decreased fine motor LUE Deficits / Details: moving spontaneously, did purposefully use to scratch nose/lip LUE Coordination: decreased gross motor;decreased fine motor    Lower Extremity Assessment Lower Extremity Assessment: RLE deficits/detail;LLE deficits/detail RLE Deficits / Details: tolerated passive R knee ROM, no active movement LLE Deficits / Details: some spontaneous movement during restlessness periods    Cervical / Trunk Assessment Cervical / Trunk Assessment: Normal (poor head control)  Communication   Communication: Tracheostomy (per wife 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- she's not sure which one)  Cognition Arousal/Alertness: Lethargic (eyes open but not responsive) Behavior During Therapy: Restless (initially very restless but calmed down signfiicantly once EOB) Overall Cognitive Status: Impaired/Different from baseline Area of Impairment: Following commands;Rancho level               Rancho Levels of Cognitive Functioning [place] Mirant Scales of Cognitive Functioning: Generalized response       Following Commands: Follows one step commands inconsistently (squeezed hand to command x1, otherwise no command follow)       General Comments: pt not tracking with eyes despite eyes open. pt did close eyes when wife hugged pt at EOB and then opened them once wife started  moving back      General Comments General comments (skin integrity, edema, etc.): pt with abdominal and R LE swelling, HR in 110s t/o session. RN present to management lines    Exercises Other  Exercises Other Exercises: PROM to bilat LEs at knees and ankles   Assessment/Plan    PT Assessment Patient needs continued PT services  PT Problem List Decreased strength;Decreased balance;Decreased mobility;Decreased safety awareness;Decreased range of motion;Decreased activity tolerance;Decreased coordination;Decreased cognition;Decreased knowledge of use of DME;Decreased knowledge of precautions;Pain       PT Treatment Interventions Gait training;DME instruction;Stair training;Functional mobility training;Therapeutic activities;Therapeutic exercise;Balance training;Neuromuscular re-education;Cognitive remediation;Patient/family education;Wheelchair mobility training    PT Goals (Current goals can be found in the Care Plan section)  Acute Rehab PT Goals Patient Stated Goal: unable to state PT Goal Formulation: With family Time For Goal Achievement: 05/11/20 Potential to Achieve Goals: Fair    Frequency Min 4X/week   Barriers to discharge        Co-evaluation               AM-PAC PT "6 Clicks" Mobility  Outcome Measure Help needed turning from your back to your side while in a flat bed without using bedrails?: Total Help needed moving from lying on your back to sitting on the side of a flat bed without using bedrails?: Total Help needed moving to and from  a bed to a chair (including a wheelchair)?: Total Help needed standing up from a chair using your arms (e.g., wheelchair or bedside chair)?: Total Help needed to walk in hospital room?: Total Help needed climbing 3-5 steps with a railing? : Total 6 Click Score: 6    End of Session Equipment Utilized During Treatment: Oxygen (RN management vent/trach) Activity Tolerance: Patient tolerated treatment well Patient left: in bed;with call bell/phone within reach;with nursing/sitter in room;with family/visitor present Nurse Communication: Mobility status PT Visit Diagnosis: Muscle weakness (generalized) (M62.81);Difficulty  in walking, not elsewhere classified (R26.2)    Time: 1610-9604 PT Time Calculation (min) (ACUTE ONLY): 41 min   Charges:   PT Evaluation $PT Eval High Complexity: 1 High PT Treatments $Therapeutic Activity: 8-22 mins $Neuromuscular Re-education: 8-22 mins        Kittie Plater, PT, DPT Acute Rehabilitation Services Pager #: [phone] Office #: [phone]   Berline Lopes 04/27/2020, 12:43 PM

## 2020-04-28 ENCOUNTER — Inpatient Hospital Stay (HOSPITAL_COMMUNITY): Payer: Managed Care, Other (non HMO)

## 2020-04-28 ENCOUNTER — Encounter (HOSPITAL_COMMUNITY): Payer: Self-pay | Admitting: Surgery

## 2020-04-28 LAB — COMPREHENSIVE METABOLIC PANEL
ALT: 54 U/L — ABNORMAL HIGH (ref 0–44)
AST: 36 U/L (ref 15–41)
Albumin: 1.9 g/dL — ABNORMAL LOW (ref 3.5–5.0)
Alkaline Phosphatase: 109 U/L (ref 38–126)
Anion gap: 11 (ref 5–15)
BUN: 35 mg/dL — ABNORMAL HIGH (ref 6–20)
CO2: 21 mmol/L — ABNORMAL LOW (ref 22–32)
Calcium: 7.8 mg/dL — ABNORMAL LOW (ref 8.9–10.3)
Chloride: 110 mmol/L (ref 98–111)
Creatinine, Ser: 0.93 mg/dL (ref 0.61–1.24)
GFR, Estimated: >60 mL/min (ref >60)
Glucose, Bld: 166 mg/dL — ABNORMAL HIGH (ref 70–99)
Potassium: 4 mmol/L (ref 3.5–5.1)
Sodium: 142 mmol/L (ref 135–145)
Total Bilirubin: 1.2 mg/dL (ref 0.3–1.2)
Total Protein: 5.1 g/dL — ABNORMAL LOW (ref 6.5–8.1)

## 2020-04-28 LAB — CBC
HCT: 27.5 % — ABNORMAL LOW (ref 39.0–52.0)
Hemoglobin: 8.7 g/dL — ABNORMAL LOW (ref 13.0–17.0)
MCH: 30.2 pg (ref 26.0–34.0)
MCHC: 31.6 g/dL (ref 30.0–36.0)
MCV: 95.5 fL (ref 80.0–100.0)
Platelets: 316 10*3/uL (ref 150–400)
RBC: 2.88 MIL/uL — ABNORMAL LOW (ref 4.22–5.81)
RDW: 16.9 % — ABNORMAL HIGH (ref 11.5–15.5)
WBC: 16.8 10*3/uL — ABNORMAL HIGH (ref 4.0–10.5)
nRBC: 0.2 % (ref 0.0–0.2)

## 2020-04-28 LAB — GLUCOSE, CAPILLARY
Glucose-Capillary: 134 mg/dL — ABNORMAL HIGH (ref 70–99)
Glucose-Capillary: 144 mg/dL — ABNORMAL HIGH (ref 70–99)
Glucose-Capillary: 153 mg/dL — ABNORMAL HIGH (ref 70–99)
Glucose-Capillary: 151 mg/dL — ABNORMAL HIGH (ref 70–99)
Glucose-Capillary: 137 mg/dL — ABNORMAL HIGH (ref 70–99)
Glucose-Capillary: 136 mg/dL — ABNORMAL HIGH (ref 70–99)

## 2020-04-28 LAB — MAGNESIUM: Magnesium: 2.3 mg/dL (ref 1.7–2.4)

## 2020-04-28 MED ORDER — DIPHENHYDRAMINE HCL 12.5 MG/5ML PO ELIX
25.0000 mg | ORAL_SOLUTION | Freq: Four times a day (QID) | ORAL | Status: DC | PRN
  Administered 2020-05-03 – 2020-05-04 (×3): 25 mg
  Filled 2020-04-28 (×4): qty 10

## 2020-04-28 MED ORDER — DIPHENHYDRAMINE HCL 50 MG/ML IJ SOLN
25.0000 mg | Freq: Four times a day (QID) | INTRAMUSCULAR | Status: DC | PRN
  Administered 2020-05-07 – 2020-05-09 (×4): 25 mg via INTRAVENOUS
  Filled 2020-04-28 (×4): qty 1

## 2020-04-28 MED ORDER — POLYETHYLENE GLYCOL 3350 17 G PO PACK: 17.0000 g | PACK | Freq: Every day | ORAL | Status: DC | PRN

## 2020-04-28 MED ORDER — SODIUM CHLORIDE 0.9 % IV SOLN
2.0000 g | Freq: Three times a day (TID) | INTRAVENOUS | Status: DC
  Administered 2020-04-28 – 2020-05-01 (×10): 2 g via INTRAVENOUS
  Filled 2020-04-28 (×10): qty 2

## 2020-04-28 MED ORDER — FAMOTIDINE 20 MG PO TABS
20.0000 mg | ORAL_TABLET | Freq: Two times a day (BID) | ORAL | Status: DC
  Administered 2020-04-28 – 2020-05-09 (×21): 20 mg
  Filled 2020-04-28 (×22): qty 1

## 2020-04-28 MED ORDER — DOCUSATE SODIUM 50 MG/5ML PO LIQD: 100.0000 mg | Freq: Two times a day (BID) | ORAL | Status: DC | PRN

## 2020-04-28 MED ORDER — QUETIAPINE FUMARATE 25 MG PO TABS
50.0000 mg | ORAL_TABLET | Freq: Two times a day (BID) | ORAL | Status: DC
  Administered 2020-04-28 – 2020-05-02 (×7): 50 mg
  Filled 2020-04-28 (×7): qty 2

## 2020-04-28 NOTE — Progress Notes (Signed)
Pharmacy Antibiotic Note  Adam Bates is a 49 y.o. male admitted on 04/17/2020 with HCAP. Pharmacy has been consulted for cefepime dosing.  On admission to the ICU, patient had hives on his trunk and legs after receiving cefazolin, levofloxacin and levetiracetam. Unclear which medication caused the reaction, which has since improved. Discussed changing pantoprazole to famotidine for GI prophylaxis and anti-histamine effect and adding prn diphenhydramine with cefepime order. MD agreed. Discussed with RN.   Good renal function. WBC 16.8 up trending, Tmax 101.9. CXR with slight increase in bibasilar opacities, which could represent atelectasis, aspiration, and/or pneumonia. TA growing GPCs.   Plan: Start cefepime 2g Q8 hr Monitor cultures, clinical status, renal fx Narrow abx as able and f/u duration  Change pantoprazole to famotidine  Add prn diphenhydramine for rash/hives   Height: 5\' 9"  (175.3 cm) Weight: 105.6 kg (232 lb 12.9 oz) IBW/kg (Calculated) : 70.7  Temp (24hrs), Avg:101.2 F (38.4 C), Min:99.5 F (37.5 C), Max:102.6 F (39.2 C)  Recent Labs  Lab 04/22/20 0319 04/23/20 0445 04/24/20 0219 04/28/20 0937  WBC 8.5 12.6* 13.2* 16.8*  CREATININE 0.72 0.64 0.61 0.93    Estimated Creatinine Clearance: 115.1 mL/min (by C-G formula based on SCr of 0.93 mg/dL).    Allergies  Allergen Reactions  . Ancef [Cefazolin] Hives    Potential allergy-Pt received Keppra and Levofloxacin also and pt "had developed hives on trunk/arms/legs"  . Keppra [Levetiracetam] Hives    Potential allergy-Pt received Levofloxacin and Ancef also and pt "had developed hives on trunk/arms/legs"  . Levofloxacin In D5w Hives    Potential allergy-Pt received Keppra and Ancef also and pt "had developed hives on trunk/arms/legs"  . Bee Venom Swelling    Antimicrobials this admission: LVQ 12/17 >> 12/18 Azactam 12/17 >> 12/20 Clinda 12/17 >> 12/20   12/17 Hives on trunk/arms/legs on arrival to  ICU (received Keppra and Ancef in OR) >> monitor, improved   Microbiology results: 12/27 TA Abundant GPC  12/17 MSSA pos 12/17 MRSA neg  12/16 resp panel neg  Thank you for allowing pharmacy to be a part of this patient's care.  Benetta Spar, PharmD, BCPS, BCCP Clinical Pharmacist  Please check AMION for all [place] phone numbers After 10:00 PM, call [place] [phone]

## 2020-04-28 NOTE — Progress Notes (Signed)
SLP Cancellation Note  Patient Details Name: Adam Bates MRN: [id number] DOB: [birth year]   Cancelled treatment:        Pt intubated. Will follow.    Royce Macadamia 04/28/2020, 8:20 AM [name] Lonell Face.Ed Nurse, children's [phone] Office [phone]

## 2020-04-28 NOTE — Anesthesia Postprocedure Evaluation (Signed)
Anesthesia Post Note  Patient: Adam Bates  Procedure(s) Performed: TRACHEOSTOMY (N/A Neck) PERCUTANEOUS ENDOSCOPIC GASTROSTOMY (PEG) PLACEMENT (N/A Abdomen)     Patient location during evaluation: SICU Anesthesia Type: General Level of consciousness: sedated Pain management: pain level controlled Vital Signs Assessment: post-procedure vital signs reviewed and stable Respiratory status: nonlabored ventilation Cardiovascular status: blood pressure returned to baseline and stable Postop Assessment: no apparent nausea or vomiting Anesthetic complications: no   No complications documented.  Last Vitals:  Vitals:   04/28/20 1700 04/28/20 1800  BP: (!) 94/57 (!) 91/53  Pulse: 73 68  Resp: (!) 25 (!) 21  Temp:    SpO2: 97% 97%    Last Pain:  Vitals:   04/28/20 1523  TempSrc: Oral  PainSc:                  Nelle Don [name] Durr

## 2020-04-28 NOTE — Evaluation (Signed)
Occupational Therapy Evaluation Patient Details Name: Adam Bates MRN: [id number] DOB: 03-15-1971 Today's Date: 04/28/2020    History of Present Illness Pt is a 49yo male s/p [facility] who sustained TBI, SAH, IPH with DAI. facial fx -no-op, L rib fx 5-11, 4-7 with PTX and bilat pulm contusions, s/pe xlap with repair of SB mesenteric lac x2 and, comminuted R femur fx and R tib/fib fracture - s/p ORIF. R LE NWB.  Pt intubated. Pt trached and pegged on 12/24. 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   Clinical Impression   This 49 y/o male presents with the above. PTA pt independent with ADL, iADL and functional mobility. Pt lethargic today, mostly maintaining eyes closed but does open x1 to command while seated EOB. Pt following simple 1 step motor commands with LUE (inconsistently) and intermittently with purposeful movements of LUE, trace activation noted with RUE to attempt command follow but with otherwise no active/purposeful movements noted with RUE/bil LEs. He currently requires two person assist for mobility attempts and overall totalA for ADL. Pt tolerating sitting EOB approx 10-15 min during session, RR up to the high 30s intermittently but otherwise with VSS on trach/vent (FiO2 40%, PEEP 5). Pt overall presenting with behaviors consistent with a Rancho Level III. He will benefit from continued acute OT services and currently recommend CIR level therapies at time of discharge to maximize his overall safety and independence with ADL and mobility.     Follow Up Recommendations  CIR    Equipment Recommendations  Other (comment) (TBD)    Recommendations for Other Services Rehab consult     Precautions / Restrictions Precautions Precautions: Fall Precaution Comments: TBI, peg, rectal pouch, male purwick, trach/vent Required Braces or Orthoses: Other Brace Other Brace: PRAFO on R foot Restrictions Weight Bearing Restrictions: Yes RLE Weight Bearing: Non  weight bearing      Mobility Bed Mobility Overal bed mobility: Needs Assistance Bed Mobility: Rolling;Supine to Sit;Sit to Supine Rolling: Total assist;+2 for physical assistance   Supine to sit: Total assist;+2 for physical assistance Sit to supine: Total assist;+2 for physical assistance   General bed mobility comments: totalAx2 to roll L/R, HOB elevated and did helicopter transfer to EOB, dependent for trunk and LE management to EOB and return to supine, no active participation    Transfers                 General transfer comment: unable at this time    Balance Overall balance assessment: Needs assistance Sitting-balance support: Feet supported;Bilateral upper extremity supported Sitting balance-Leahy Scale: Zero Sitting balance - Comments: dependent on total posterior assist with additional assist to hold head up. EOB x 10-15 min with RN called in to suction the pt. Postural control: Posterior lean                                 ADL either performed or assessed with clinical judgement   ADL Overall ADL's : Needs assistance/impaired                                       General ADL Comments: pt currently totalA     Vision   Additional Comments: will continue to assess; pt opens eyes to command given increased time, overall with tendency to maintain eyes closed  Perception     Praxis      Pertinent Vitals/Pain Pain Assessment: Faces Faces Pain Scale: Hurts little more Pain Location: generalized with movement, grimaced to noxious stimuli Pain Descriptors / Indicators: Grimacing Pain Intervention(s): Monitored during session     Hand Dominance Right   Extremity/Trunk Assessment Upper Extremity Assessment Upper Extremity Assessment: RUE deficits/detail;LUE deficits/detail;Difficult to assess due to impaired cognition RUE Deficits / Details: minimal movements noted in response to commands (i.e. squeeze hand) RUE  Coordination: decreased gross motor;decreased fine motor LUE Deficits / Details: pt squeezing therapist's hand to command, good grip strength, proximally weak requiring some assist to reach fully towards face/head (though pt quick to attempt to reach towards trach/vent) LUE Coordination: decreased gross motor   Lower Extremity Assessment Lower Extremity Assessment: Defer to PT evaluation   Cervical / Trunk Assessment Cervical / Trunk Assessment:  (poor head control)   Communication Communication Communication: Tracheostomy   Cognition Arousal/Alertness: Lethargic (increased eye opening in seated position, fatigues quickly) Behavior During Therapy: Restless (until sitting EOB) Overall Cognitive Status: Impaired/Different from baseline Area of Impairment: Following commands;Rancho level               Rancho Levels of Cognitive Functioning Rancho Mirant Scales of Cognitive Functioning: Localized response       Following Commands: Follows one step commands inconsistently (with RUE only, not with RLE or L side)       General Comments: pt able to open eyes to look at wife when she spoke, did leave eyes closed for most of session. increased frequency of command following today with RUE   General Comments  RUE and LE swelling, RN came to suction pt during session. vent trach on 40% FiO2 and PEEP of 5, intermittently with elevated RR but otherwise with VSS    Exercises Exercises: Other exercises Other Exercises Other Exercises: PROM to bilat LEs at knees and ankles   Shoulder Instructions      Home Living Family/patient expects to be discharged to:: Private residence Living Arrangements: Spouse/significant other;Children Available Help at Discharge: Family;Available 24 hours/day Type of Home: House Home Access: Ramped entrance     Home Layout: Two level;Able to live on main level with bedroom/bathroom Alternate Level Stairs-Number of Steps: flight Alternate Level  Stairs-Rails: Right Bathroom Shower/Tub: Chief Strategy Officer: Standard     Home Equipment: None          Prior Functioning/Environment Level of Independence: Independent        Comments: 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, research (life sciences) at [employer] Problem List: Decreased strength;Decreased range of motion;Decreased activity tolerance;Impaired balance (sitting and/or standing);Decreased cognition;Impaired UE functional use;Cardiopulmonary status limiting activity;Decreased knowledge of precautions;Decreased knowledge of use of DME or AE;Decreased safety awareness;Decreased coordination;Pain;Increased edema      OT Treatment/Interventions: Self-care/ADL training;Therapeutic exercise;Energy conservation;Neuromuscular education;DME and/or AE instruction;Therapeutic activities;Cognitive remediation/compensation;Visual/perceptual remediation/compensation;Patient/family education;Balance training    OT Goals(Current goals can be found in the care plan section) Acute Rehab OT Goals Patient Stated Goal: unable to state OT Goal Formulation: Patient unable to participate in goal setting Time For Goal Achievement: 05/12/20 Potential to Achieve Goals: Good  OT Frequency: Min 2X/week   Barriers to D/C:            Co-evaluation PT/OT/SLP Co-Evaluation/Treatment: Yes Reason for Co-Treatment: Complexity of the patient's impairments (multi-system involvement);Necessary to address cognition/behavior during functional activity;For patient/therapist safety;To address functional/ADL transfers PT goals addressed  during session: Mobility/safety with mobility;Balance;Strengthening/ROM OT goals addressed during session: ADL's and self-care      AM-PAC OT "6 Clicks" Daily Activity     Outcome Measure Help from another person eating meals?: Total Help from another person taking care of personal grooming?: Total Help from another person toileting, which  includes using toliet, bedpan, or urinal?: Total Help from another person bathing (including washing, rinsing, drying)?: Total Help from another person to put on and taking off regular upper body clothing?: Total Help from another person to put on and taking off regular lower body clothing?: Total 6 Click Score: 6   End of Session Equipment Utilized During Treatment: Oxygen Nurse Communication: Mobility status  Activity Tolerance: Patient tolerated treatment well Patient left: in bed;with call bell/phone within reach;with family/visitor present  OT Visit Diagnosis: Muscle weakness (generalized) (M62.81);Other abnormalities of gait and mobility (R26.89);Other symptoms and signs involving cognitive function                Time: [id number] OT Time Calculation (min): 33 min Charges:  OT General Charges $OT Visit: 1 Visit OT Evaluation $OT Eval High Complexity: 1 High  Lou Cal, OT Acute Rehabilitation Services Pager [phone] Office [phone]  Raymondo Band 04/28/2020, 4:36 PM

## 2020-04-28 NOTE — Progress Notes (Signed)
Trauma/Critical Care Follow Up Note  Subjective:    Overnight Issues: febrile. Not tol TC trials  Objective:  Vital signs for last 24 hours: Temp:  [100.5 F (38.1 C)-101.9 F (38.8 C)] 101.9 F (38.8 C) (12/27 0000) Pulse Rate:  [73-124] 73 (12/27 0700) Resp:  [0-31] 0 (12/27 0700) BP: (80-154)/(53-92) 99/63 (12/27 0700) SpO2:  [92 %-97 %] 96 % (12/27 0700) FiO2 (%):  [40 %] 40 % (12/27 0405) Weight:  [105.6 kg] 105.6 kg (12/27 0500)  Hemodynamic parameters for last 24 hours:    Intake/Output from previous day: 12/26 0701 - 12/27 0700 In: 988.1 [I.V.:218.1; NG/GT:770] Out: 800 [Urine:800]  Intake/Output this shift: No intake/output data recorded.  Vent settings for last 24 hours: Vent Mode: PRVC FiO2 (%):  [40 %] 40 % Set Rate:  [20 bmp] 20 bmp Vt Set:  [560 mL] 560 mL PEEP:  [5 cmH20] 5 cmH20 Pressure Support:  [5 cmH20] 5 cmH20 Plateau Pressure:  [18 cmH20-20 cmH20] 19 cmH20  Physical Exam:  Gen: comfortable, no distress Neuro: grossly non-focal, does not follow commands for me, but nurse and wife reports he intermittently sticks out his tongue to command and squeezes hand HEENT: trach site with mucus secretions, no erythema  Neck: supple CV: RRR Pulm: unlabored breathing, mechanically ventilated Abd: soft, nontender, PEG in place at 4 at the skin, incision c/d/i with staples, no cellulitis, abdominal binder in place GU: clear, yellow urine, primafit in place Extr: wwp, no edema   Results for orders placed or performed during the hospital encounter of 04/17/20 (from the past 24 hour(s))  Glucose, capillary     Status: Abnormal   Collection Time: 04/27/20 11:12 AM  Result Value Ref Range   Glucose-Capillary 133 (H) 70 - 99 mg/dL  Glucose, capillary     Status: Abnormal   Collection Time: 04/27/20  3:55 PM  Result Value Ref Range   Glucose-Capillary 147 (H) 70 - 99 mg/dL  Glucose, capillary     Status: Abnormal   Collection Time: 04/27/20  7:50 PM   Result Value Ref Range   Glucose-Capillary 128 (H) 70 - 99 mg/dL  Glucose, capillary     Status: Abnormal   Collection Time: 04/27/20 11:26 PM  Result Value Ref Range   Glucose-Capillary 121 (H) 70 - 99 mg/dL  Glucose, capillary     Status: Abnormal   Collection Time: 04/28/20  4:09 AM  Result Value Ref Range   Glucose-Capillary 134 (H) 70 - 99 mg/dL    Assessment & Plan: The plan of care was discussed with the bedside nurse for the day, Marissa, who is in agreement with this plan and no additional concerns were raised.   Present on Admission: **None**    LOS: 11 days   Additional comments:I reviewed the patient's new clinical lab test results.   and I reviewed the patients new imaging test results.    [facility]  TBISAH,IPH- NSGY c/s, Dr. Reatha Armour, stable head CT, hold keppra in light of intra-op rash of unclear etiology after admin, MRIyest with g2 DAI, expecting if recovery occurs that it will be weeks to months. Intermittently f/c this AM Facial fxs- ENT c/s, Dr. Constance Holster, non-op management Multiple L rib fxs (5-11, 4-7) with PTX, b/l pulm contusions- IS, pulm toilet, pain control,weaning vent VDRF- continuewean,PSV/TC as tolerated. Trach 12/24. Did 1 hour of Trach collar yesterday but became agitated and hypoxic. Hemoperitoneum- s/p exlap with repair of SB mesenteric laceration x2 by Dr. Redmond Pulling, also noted mesenteric hematoma  ComminutedR femur fx - Ortho c/s, Dr. Doreatha Martin, to OR 12/17 Open R tib/fib fxs- Ortho c/s, Dr. Doreatha Martin, to OR 12/17, s/p ancef in TB, but transitioned to levaquin after intra-op rash of unclear etiology after admin ABLA-hct stable FEN - restart tube feeds, PEG 12/24, abdominal binder DVT - SCDs,SQHOKby NSGY Dispo -ICU, start PT/OT/SLP, begin dispo planning  Critical Care Total Time: 42 minutes  [facility] Please use [email or web address] to contact on call provider  04/28/2020  *Care during the described time  interval was provided by me. I have reviewed this patient's available data, including medical history, events of note, physical examination and test results as part of my evaluation.

## 2020-04-28 NOTE — Progress Notes (Signed)
   04/28/20 1219  Vent Select  Invasive or Noninvasive Invasive  Adult Vent Y  Tracheostomy Shiley Flexible 6 mm Cuffed  Placement Date/Time: 04/25/20 1304   Placed By: (c) Other (Comment)  Brand: Shiley Flexible  Size (mm): 6 mm  Style: Cuffed  Serial / Lot #: [id number]  Expiration Date: 11/16/23  Status Secured  Site Assessment Clean;Dry  Site Care Other (Comment) (RN completed earlier, site is clean.)  Ties Assessment Clean;Dry;Secure  Tracheostomy Equipment at bedside Yes and checklist posted at head of bed  Adult Ventilator Settings  Vent Type Servo i  Humidity HME  Vent Mode PRVC  Vt Set 560 mL  Set Rate 20 bmp  FiO2 (%) 40 %  I Time 0.71 Sec(s)  PEEP 5 cmH20  Adult Ventilator Measurements  Peak Airway Pressure 24 L/min  Mean Airway Pressure 10 cmH20  Resp Rate Spontaneous 4 br/min  Resp Rate Total 24 br/min  Exhaled Vt 562 mL  Measured Ve 13.1 mL  I:E Ratio Measured 1:2.3  Total PEEP 5 cmH20  SpO2 97 %  Adult Ventilator Alarms  Alarms On Y  Ve High Alarm 20 L/min  Ve Low Alarm 4 L/min  Resp Rate High Alarm 38 br/min  Resp Rate Low Alarm 8  PEEP Low Alarm 3 cmH2O  Press High Alarm 50 cmH2O  T Apnea 20 sec(s)  VAP Prevention  HOB> 30 Degrees Y  Breath Sounds  Bilateral Breath Sounds Rhonchi;Diminished  R Lower Breath Sounds Diminished  L Lower Breath Sounds Diminished  Vent Respiratory Assessment  Level of Consciousness Responds to Voice  Respiratory Pattern Regular;Unlabored  Suction Method  Respiratory Interventions (S)  Airway suction;Oral suction (sputum culture obtained.)  Oral Suctioning/Secretions  Suction Type  (not indicated)  Airway Suctioning/Secretions  Suction Type Tracheal  Suction Device  Catheter  Secretion Amount Small  Secretion Color Tan;Pink tinged  Secretion Consistency Thick  Suction Tolerance Tolerated well  Suctioning Adverse Effects None

## 2020-04-28 NOTE — Progress Notes (Signed)
Physical Therapy Treatment Patient Details Name: Adam Bates MRN: [id number] DOB: Sep 03, 1970 Today's Date: 04/28/2020    History of Present Illness Pt is a 49yo male s/p [facility] who sustained TBI, SAH, IPH with DAI. facial fx -no-op, L rib fx 5-11, 4-7 with PTX and bilat pulm contusions, s/pe xlap with repair of SB mesenteric lac x2 and, comminuted R femur fx and R tib/fib fracture - s/p ORIF. R LE NWB.  Pt intubated. Pt trached and pegged on 12/24. 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    PT Comments    The pt was seen in conjunction by PT/OT today to improve safety with transitions at this time. The pt was able to follow significantly more commands at this time such as "thumbs up", "open your eyes", and other movements of the LUE. The pt was unable to follow any commands regarding LLE or R-sided extremities at this time. The pt tolerated sitting EOB with total posterior support for >10 min, but continues to require totalA of 2-3 to complete mobility with management of lines, and does fatigue with decreased command following and increased need for command repetition or encouragement by end of session. The pt currently presents with behaviors consistent with Ranchos Level III - localized response. The pt will continue to benefit from skilled PT to progress functional activity tolerance, stability, command following, and strength.    Follow Up Recommendations  CIR     Equipment Recommendations   (defer to post acute)    Recommendations for Other Services       Precautions / Restrictions Precautions Precautions: Fall Precaution Comments: TBI, peg, rectal pouch, male purwick, trach Required Braces or Orthoses: Other Brace Other Brace: PRAFO on R foot Restrictions Weight Bearing Restrictions: Yes RLE Weight Bearing: Non weight bearing    Mobility  Bed Mobility Overal bed mobility: Needs Assistance Bed Mobility: Rolling;Supine to Sit;Sit to  Supine Rolling: Total assist;+2 for physical assistance   Supine to sit: Total assist;+2 for physical assistance Sit to supine: Total assist;+2 for physical assistance   General bed mobility comments: totalAx2 to roll L/R, HOB elevated and did helicopter transfer to EOB, dependent for trunk and LE management to EOB and return to supine, no active participation  Transfers                 General transfer comment: unable at this time  Ambulation/Gait             General Gait Details: unable at this time         Balance Overall balance assessment: Needs assistance Sitting-balance support: Feet supported;Bilateral upper extremity supported Sitting balance-Leahy Scale: Zero Sitting balance - Comments: dependent on total posterior assist with additional assist to hold head up. EOB x 10-15 min with RN called in to suction the pt. Postural control: Posterior lean                                  Cognition Arousal/Alertness: Lethargic (increased eye opening in seated position, fatigues quickly) Behavior During Therapy: Restless (until sitting EOB) Overall Cognitive Status: Impaired/Different from baseline Area of Impairment: Following commands;Rancho level               Rancho Levels of Cognitive Functioning Rancho Mirant Scales of Cognitive Functioning: Localized response       Following Commands: Follows one step commands inconsistently (with RUE, not with RLE  or L side)       General Comments: pt able to open eyes to look at wife when she spoke, did leave eyes closed for most of session. increased frequency of command following today      Exercises Other Exercises Other Exercises: PROM to bilat LEs at knees and ankles    General Comments General comments (skin integrity, edema, etc.): RUE and LE swelling, RN came to suction pt during session. vent trach on 40% FiO2 and PEEP of 5      Pertinent Vitals/Pain Pain Assessment:  Faces Faces Pain Scale: Hurts little more Pain Location: generalized with movement, grimaced to noxious stimuli Pain Descriptors / Indicators: Grimacing Pain Intervention(s): Limited activity within patient's tolerance;Monitored during session;Repositioned           PT Goals (current goals can now be found in the care plan section) Acute Rehab PT Goals Patient Stated Goal: unable to state PT Goal Formulation: With family Time For Goal Achievement: 05/11/20 Potential to Achieve Goals: Fair Progress towards PT goals: Progressing toward goals    Frequency    Min 4X/week      PT Plan Current plan remains appropriate    Co-evaluation PT/OT/SLP Co-Evaluation/Treatment: Yes Reason for Co-Treatment: Complexity of the patient's impairments (multi-system involvement);Necessary to address cognition/behavior during functional activity;For patient/therapist safety;To address functional/ADL transfers PT goals addressed during session: Mobility/safety with mobility;Balance;Strengthening/ROM        AM-PAC PT "6 Clicks" Mobility   Outcome Measure  Help needed turning from your back to your side while in a flat bed without using bedrails?: Total Help needed moving from lying on your back to sitting on the side of a flat bed without using bedrails?: Total Help needed moving to and from a bed to a chair (including a wheelchair)?: Total Help needed standing up from a chair using your arms (e.g., wheelchair or bedside chair)?: Total Help needed to walk in hospital room?: Total Help needed climbing 3-5 steps with a railing? : Total 6 Click Score: 6    End of Session Equipment Utilized During Treatment: Oxygen (vent trach) Activity Tolerance: Patient tolerated treatment well Patient left: in bed;with call bell/phone within reach;with nursing/sitter in room;with family/visitor present Nurse Communication: Mobility status PT Visit Diagnosis: Muscle weakness (generalized) (M62.81);Difficulty  in walking, not elsewhere classified (R26.2)     Time: [id number] PT Time Calculation (min) (ACUTE ONLY): 33 min  Charges:  $Therapeutic Activity: 8-22 mins                     Karma Ganja, PT, DPT   Acute Rehabilitation Department Pager #: [phone]   Otho Bellows 04/28/2020, 2:46 PM

## 2020-04-28 NOTE — Progress Notes (Signed)
   04/28/20 0906  Vent Select  Invasive or Noninvasive Invasive  Adult Vent Y  Tracheostomy Shiley Flexible 6 mm Cuffed  Placement Date/Time: 04/25/20 1304   Placed By: (c) Other (Comment)  Brand: Shiley Flexible  Size (mm): 6 mm  Style: Cuffed  Serial / Lot #: [id number]  Expiration Date: 11/16/23  Status Secured  Site Assessment Red;Other (Comment) (old blood will wait until later to deep clean, sutures intact.)  Site Care Dressing applied  [place] Other (Comment) (patent)  Ties Assessment Clean;Dry;Secure  Adult Ventilator Settings  Vent Type Servo i  Humidity HME  Vent Mode PRVC  Vt Set 560 mL  Set Rate 20 bmp  FiO2 (%) 40 %  I Time 0.71 Sec(s)  PEEP 5 cmH20  Adult Ventilator Measurements  Peak Airway Pressure 20 L/min  Mean Airway Pressure 12 cmH20  Resp Rate Spontaneous 8 br/min  Resp Rate Total 28 br/min  Exhaled Vt 618 mL  Measured Ve 16.3 mL  I:E Ratio Measured 1:1.9  Total PEEP 5 cmH20  SpO2 97 %  Adult Ventilator Alarms  Alarms On Y  Ve High Alarm 20 L/min  Ve Low Alarm 4 L/min  Resp Rate High Alarm 38 br/min  Resp Rate Low Alarm 8  PEEP Low Alarm 3 cmH2O  Press High Alarm 50 cmH2O  T Apnea 20 sec(s)  VAP Prevention  Cuff pressure (initial) 30 cm H2O  HOB> 30 Degrees Y  Equipment wiped down Yes  Daily Weaning Assessment  Daily Assessment of Readiness to Wean Wean protocol criteria not met  Reason not met  (Fever 102, discussed with RN to hold off wean at this time.)  Breath Sounds  Bilateral Breath Sounds Rhonchi;Diminished  R Lower Breath Sounds Diminished  L Lower Breath Sounds Diminished  Vent Respiratory Assessment  Level of Consciousness Responds to Pain  Respiratory Pattern Regular;Unlabored  Suction Method  Respiratory Interventions Airway suction;Oral suction  Oral Suctioning/Secretions  Suction Type Oral  Suction Device Y[place]auer  Secretion Amount None  Suction Tolerance Tolerated well  Suctioning Adverse Effects None   Airway Suctioning/Secretions  Suction Type ETT  Suction Device  Catheter  Secretion Amount Moderate  Secretion Color Yellow  Secretion Consistency Thick  Suction Tolerance Tolerated well  Suctioning Adverse Effects None

## 2020-04-29 ENCOUNTER — Inpatient Hospital Stay (HOSPITAL_COMMUNITY): Payer: Managed Care, Other (non HMO)

## 2020-04-29 LAB — CBC
HCT: 26.6 % — ABNORMAL LOW (ref 39.0–52.0)
Hemoglobin: 8.1 g/dL — ABNORMAL LOW (ref 13.0–17.0)
MCH: 29.1 pg (ref 26.0–34.0)
MCHC: 30.5 g/dL (ref 30.0–36.0)
MCV: 95.7 fL (ref 80.0–100.0)
Platelets: 321 10*3/uL (ref 150–400)
RBC: 2.78 MIL/uL — ABNORMAL LOW (ref 4.22–5.81)
RDW: 16.7 % — ABNORMAL HIGH (ref 11.5–15.5)
WBC: 16.2 10*3/uL — ABNORMAL HIGH (ref 4.0–10.5)
nRBC: 0.2 % (ref 0.0–0.2)

## 2020-04-29 LAB — BLOOD CULTURE ID PANEL (REFLEXED) - BCID2

## 2020-04-29 LAB — POCT I-STAT 7, (LYTES, BLD GAS, ICA,H+H)
Acid-Base Excess: 1 mmol/L (ref 0.0–2.0)
Bicarbonate: 25.3 mmol/L (ref 20.0–28.0)
Calcium, Ion: 1.21 mmol/L (ref 1.15–1.40)
HCT: 24 % — ABNORMAL LOW (ref 39.0–52.0)
Hemoglobin: 8.2 g/dL — ABNORMAL LOW (ref 13.0–17.0)
O2 Saturation: 95 %
Patient temperature: 100.6
Potassium: 3.8 mmol/L (ref 3.5–5.1)
Sodium: 148 mmol/L — ABNORMAL HIGH (ref 135–145)
TCO2: 26 mmol/L (ref 22–32)
pCO2 arterial: 38.4 mmHg (ref 32.0–48.0)
pH, Arterial: 7.431 (ref 7.350–7.450)
pO2, Arterial: 78 mmHg — ABNORMAL LOW (ref 83.0–108.0)

## 2020-04-29 LAB — GLUCOSE, CAPILLARY
Glucose-Capillary: 124 mg/dL — ABNORMAL HIGH (ref 70–99)
Glucose-Capillary: 139 mg/dL — ABNORMAL HIGH (ref 70–99)
Glucose-Capillary: 112 mg/dL — ABNORMAL HIGH (ref 70–99)
Glucose-Capillary: 131 mg/dL — ABNORMAL HIGH (ref 70–99)
Glucose-Capillary: 130 mg/dL — ABNORMAL HIGH (ref 70–99)
Glucose-Capillary: 140 mg/dL — ABNORMAL HIGH (ref 70–99)

## 2020-04-29 LAB — BASIC METABOLIC PANEL
Anion gap: 8 (ref 5–15)
BUN: 35 mg/dL — ABNORMAL HIGH (ref 6–20)
CO2: 23 mmol/L (ref 22–32)
Calcium: 7.7 mg/dL — ABNORMAL LOW (ref 8.9–10.3)
Chloride: 114 mmol/L — ABNORMAL HIGH (ref 98–111)
Creatinine, Ser: 0.9 mg/dL (ref 0.61–1.24)
GFR, Estimated: >60 mL/min (ref >60)
Glucose, Bld: 155 mg/dL — ABNORMAL HIGH (ref 70–99)
Potassium: 3.8 mmol/L (ref 3.5–5.1)
Sodium: 145 mmol/L (ref 135–145)

## 2020-04-29 LAB — URINE CULTURE: Culture: NO GROWTH

## 2020-04-29 LAB — MAGNESIUM: Magnesium: 2.4 mg/dL (ref 1.7–2.4)

## 2020-04-29 MED ORDER — BACITRACIN-NEOMYCIN-POLYMYXIN OINTMENT TUBE
1.0000 "application " | TOPICAL_OINTMENT | Freq: Every day | CUTANEOUS | Status: AC
  Administered 2020-04-29 – 2020-05-05 (×6): 1 via TOPICAL
  Filled 2020-04-29: qty 14
  Filled 2020-04-29: qty 1

## 2020-04-29 MED ORDER — VANCOMYCIN HCL 1500 MG/300ML IV SOLN
1500.0000 mg | Freq: Two times a day (BID) | INTRAVENOUS | Status: DC
  Administered 2020-04-30 – 2020-05-02 (×5): 1500 mg via INTRAVENOUS
  Filled 2020-04-29 (×5): qty 300

## 2020-04-29 MED ORDER — IOHEXOL 300 MG/ML  SOLN
50.0000 mL | Freq: Once | INTRAMUSCULAR | Status: AC | PRN
  Administered 2020-04-29: 15 mL

## 2020-04-29 MED ORDER — LIDOCAINE VISCOUS HCL 2 % MT SOLN
OROMUCOSAL | Status: AC
  Filled 2020-04-29: qty 15

## 2020-04-29 MED ORDER — VANCOMYCIN HCL 2000 MG/400ML IV SOLN
2000.0000 mg | INTRAVENOUS | Status: AC
  Administered 2020-04-29: 2000 mg via INTRAVENOUS
  Filled 2020-04-29: qty 400

## 2020-04-29 MED ORDER — IOHEXOL 300 MG/ML  SOLN
50.0000 mL | Freq: Once | INTRAMUSCULAR | Status: AC | PRN
  Administered 2020-04-29: 50 mL

## 2020-04-29 NOTE — Progress Notes (Addendum)
PHARMACY - PHYSICIAN COMMUNICATION CRITICAL VALUE ALERT - BLOOD CULTURE IDENTIFICATION (BCID)  PM update Both right hand and left hand BCX are growing GPC plus trach aspirate cx staph aureus  Spoke to MD and decided to begin vancomycin 2gm IV x1 then 1500mg  IV q12h Est AUC 457   Adam Bates is an 48 y.o. male who presented to [facility] on 04/17/2020 with a chief complaint of [facility]  Assessment:  Current ABX cefepime for fever wbc 16 and TA with staph aureus  BC tonight resulted 1/2 coag negative staph  Name of physician (or Provider) Contacted: Cornette  Current antibiotics: Cefepime  Changes to prescribed antibiotics recommended:  No changes at this time   Results for orders placed or performed during the hospital encounter of 04/17/20  Blood Culture ID Panel (Reflexed) (Collected: 04/28/2020  9:37 AM)  Result Value Ref Range   Enterococcus faecalis NOT DETECTED NOT DETECTED   Enterococcus Faecium NOT DETECTED NOT DETECTED   Listeria monocytogenes NOT DETECTED NOT DETECTED   Staphylococcus species DETECTED (A) NOT DETECTED   Staphylococcus aureus (BCID) NOT DETECTED NOT DETECTED   Staphylococcus epidermidis DETECTED (A) NOT DETECTED   Staphylococcus lugdunensis NOT DETECTED NOT DETECTED   Streptococcus species NOT DETECTED NOT DETECTED   Streptococcus agalactiae NOT DETECTED NOT DETECTED   Streptococcus pneumoniae NOT DETECTED NOT DETECTED   Streptococcus pyogenes NOT DETECTED NOT DETECTED   A.calcoaceticus-baumannii NOT DETECTED NOT DETECTED   Bacteroides fragilis NOT DETECTED NOT DETECTED   Enterobacterales NOT DETECTED NOT DETECTED   Enterobacter cloacae complex NOT DETECTED NOT DETECTED   Escherichia coli NOT DETECTED NOT DETECTED   Klebsiella aerogenes NOT DETECTED NOT DETECTED   Klebsiella oxytoca NOT DETECTED NOT DETECTED   Klebsiella pneumoniae NOT DETECTED NOT DETECTED   Proteus species NOT DETECTED NOT DETECTED   Salmonella species NOT DETECTED NOT DETECTED    Serratia marcescens NOT DETECTED NOT DETECTED   Haemophilus influenzae NOT DETECTED NOT DETECTED   Neisseria meningitidis NOT DETECTED NOT DETECTED   Pseudomonas aeruginosa NOT DETECTED NOT DETECTED   Stenotrophomonas maltophilia NOT DETECTED NOT DETECTED   Candida albicans NOT DETECTED NOT DETECTED   Candida auris NOT DETECTED NOT DETECTED   Candida glabrata NOT DETECTED NOT DETECTED   Candida krusei NOT DETECTED NOT DETECTED   Candida parapsilosis NOT DETECTED NOT DETECTED   Candida tropicalis NOT DETECTED NOT DETECTED   Cryptococcus neoformans/gattii NOT DETECTED NOT DETECTED   Methicillin resistance mecA/C DETECTED (A) NOT DETECTED    04/30/2020 Pharm.D. CPP, BCPS Clinical Pharmacist [phone] 04/29/2020 6:21 PM

## 2020-04-29 NOTE — Progress Notes (Signed)
Restart tube feeds and meds via catheter per Dr. Bedelia Person

## 2020-04-29 NOTE — Progress Notes (Signed)
Upon assessment with Dr. Bedelia Person, PEG tube with balloon found when abdominal binder opened. Moderate amount of tube feeding noted on binder. See Dr. Marvetta Gibbons note regarding foley placement to site. NGT inserted and placed to LIWS. Spoke with Radiology for Stat imaging.

## 2020-04-29 NOTE — Progress Notes (Signed)
Patient exaimned this AM and feeding tube completely out of the abdomen on my exam despite abdominal binder. Unknown period of time tube has been out. Foley placed into tract, easily replaced. Plan for stat study of Foley to confirm intra-gastric placement. NG tub a placement also ordered. Recent surgery 12/17, so no option for re-exploration laparotomy at this point.   Diamantina Monks, MD General and Trauma Surgery [facility] Surgery

## 2020-04-29 NOTE — Progress Notes (Signed)
PT Cancellation Note  Patient Details Name: Adam Bates MRN: [id number] DOB: 02-25-1971   Cancelled Treatment:    Reason Eval/Treat Not Completed: Medical issues which prohibited therapy as the pt's PEG tube was found to be out this morning, RN asked PT to hold as he is undergoing continued workup and management at this time. PT will continue to follow and treat as appropriate.   [name], PT, DPT   Acute Rehabilitation Department Pager #: [phone]   [name] 04/29/2020, 8:43 AM

## 2020-04-29 NOTE — Progress Notes (Signed)
Pt restless, continuously removing ventilator from trach with L hand.  After multiple attempts made to redirect pt, a L wrist restraint applied to ensure safety and placement of trach.

## 2020-04-29 NOTE — Progress Notes (Signed)
Nutrition Follow-up  DOCUMENTATION CODES:   Not applicable  INTERVENTION:   -Once feeding access is established, resume:   Pivot 1.5 @ 70 ml/hr (1680 ml per day)  Provides 2520 kcal, 157 gm protein, 1275 ml free water daily  NUTRITION DIAGNOSIS:   Increased nutrient needs related to acute illness as evidenced by estimated needs.  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  12/22- cortrak tube placed (tip of tube in stomach) 12/24- s/p Procedure: percutaneous tracheostomy with bronchoscopic assistance, percutaneous endoscopic gastrostomy tube placement 12/25- TF re-started, rectal pouch placed   Patient remains on ventilator support via trach.  MV: 16  L/min Temp (24hrs), Avg:100.4 F (38 C), Min:99.8 F (37.7 C), Max:101 F (38.3 C)  Reviewed I/O's: +1.4 L x 24 hours and +20.1 L since admission  UOP: 1.4 L x 24 hours  Stool output: 750 ml x 24 hours via rectal pouch  Per chart review, pt not tolerating trach collar/ weaning trials well. He remains on vent via trach.   Per RN notes, PEG tube out of abdomen when abdominal binder was removed. NGT placed and connected to low, intermittent suction. Foley was placed into tract and awaiting stat study to confirm placement.   Medications reviewed and include precedex.   Labs reviewed: CBGS: 139-124.   Diet Order:   Diet Order            Diet NPO time specified  Diet effective midnight                 EDUCATION NEEDS:   No education needs have  been identified at this time  Skin:  Skin Assessment: Skin Integrity Issues: Skin Integrity Issues:: Other (Comment),Incisions Incisions: closed abdomen, neck, and rt leg Other: rt eye laceration  Last BM:  12/127/21 (type 6) via rectal pouch  Height:   Ht Readings from Last 1 Encounters:  04/19/20 5\' 9"  (1.753 m)    Weight:   Wt Readings from Last 1 Encounters:  04/29/20 105.5 kg    Ideal Body Weight:  72.7 kg  BMI:  Body mass index is 34.35 kg/m.  Estimated Nutritional Needs:   Kcal:  2300-2500  Protein:  140-160 grams  Fluid:  >2 L/day    05/01/20, RD, LDN, CDCES Registered Dietitian II Certified Diabetes Care and Education Specialist Please refer to [facility] for RD and/or RD on-call/weekend/after hours pager

## 2020-04-29 NOTE — Progress Notes (Signed)
Trauma/Critical Care Follow Up Note  Subjective:    Overnight Issues:   Objective:  Vital signs for last 24 hours: Temp:  [99.5 F (37.5 C)-101 F (38.3 C)] 100.3 F (37.9 C) (12/28 0800) Pulse Rate:  [64-104] 88 (12/28 0900) Resp:  [20-37] 29 (12/28 0900) BP: (86-118)/(46-86) 114/72 (12/28 0900) SpO2:  [96 %-100 %] 97 % (12/28 0900) FiO2 (%):  [30 %-40 %] 30 % (12/28 0847) Weight:  [105.5 kg] 105.5 kg (12/28 0500)  Hemodynamic parameters for last 24 hours:    Intake/Output from previous day: 12/27 0701 - 12/28 0700 In: 3508.8 [I.V.:688.7; NG/GT:2520; IV Piggyback:300.1] Out: 2150 [Urine:1400; Stool:750]  Intake/Output this shift: No intake/output data recorded.  Vent settings for last 24 hours: Vent Mode: PSV;CPAP FiO2 (%):  [30 %-40 %] 30 % Set Rate:  [20 bmp] 20 bmp Vt Set:  [560 mL] 560 mL PEEP:  [5 cmH20] 5 cmH20 Pressure Support:  [5 cmH20] 5 cmH20 Plateau Pressure:  [18 cmH20] 18 cmH20  Physical Exam:  Gen: comfortable, no distress Neuro: follows commands HEENT: trached Neck: supple CV: RRR Pulm: unlabored breathing, mechanically ventilated Abd: soft, nontender, PEG has been replaced with foley GU: clear, yellow urine Extr: wwp, no edema   Results for orders placed or performed during the hospital encounter of 04/17/20 (from the past 24 hour(s))  Culture, respiratory (non-expectorated)     Status: None (Preliminary result)   Collection Time: 04/28/20 12:19 PM   Specimen: Tracheal Aspirate; Respiratory  Result Value Ref Range   Specimen Description TRACHEAL ASPIRATE    Special Requests Normal    Gram Stain      ABUNDANT WBC PRESENT,BOTH PMN AND MONONUCLEAR ABUNDANT GRAM POSITIVE COCCI    Culture      ABUNDANT STAPHYLOCOCCUS AUREUS SUSCEPTIBILITIES TO FOLLOW Performed at [facility], [address], [place], [place] [address]    Report Status PENDING   Glucose, capillary     Status: Abnormal   Collection Time: 04/28/20 12:48 PM   Result Value Ref Range   Glucose-Capillary 153 (H) 70 - 99 mg/dL  Glucose, capillary     Status: Abnormal   Collection Time: 04/28/20  3:23 PM  Result Value Ref Range   Glucose-Capillary 151 (H) 70 - 99 mg/dL  Glucose, capillary     Status: Abnormal   Collection Time: 04/28/20  7:31 PM  Result Value Ref Range   Glucose-Capillary 137 (H) 70 - 99 mg/dL  Glucose, capillary     Status: Abnormal   Collection Time: 04/28/20 11:15 PM  Result Value Ref Range   Glucose-Capillary 136 (H) 70 - 99 mg/dL  CBC     Status: Abnormal   Collection Time: 04/29/20 12:19 AM  Result Value Ref Range   WBC 16.2 (H) 4.0 - 10.5 K/uL   RBC 2.78 (L) 4.22 - 5.81 MIL/uL   Hemoglobin 8.1 (L) 13.0 - 17.0 g/dL   HCT 26.6 (L) 39.0 - 52.0 %   MCV 95.7 80.0 - 100.0 fL   MCH 29.1 26.0 - 34.0 pg   MCHC 30.5 30.0 - 36.0 g/dL   RDW 16.7 (H) 11.5 - 15.5 %   Platelets 321 150 - 400 K/uL   nRBC 0.2 0.0 - 0.2 %  Basic metabolic panel     Status: Abnormal   Collection Time: 04/29/20 12:19 AM  Result Value Ref Range   Sodium 145 135 - 145 mmol/L   Potassium 3.8 3.5 - 5.1 mmol/L   Chloride 114 (H) 98 -  111 mmol/L   CO2 23 22 - 32 mmol/L   Glucose, Bld 155 (H) 70 - 99 mg/dL   BUN 35 (H) 6 - 20 mg/dL   Creatinine, Ser 1.61 0.61 - 1.24 mg/dL   Calcium 7.7 (L) 8.9 - 10.3 mg/dL   GFR, Estimated >09 >60 mL/min   Anion gap 8 5 - 15  Magnesium     Status: None   Collection Time: 04/29/20 12:19 AM  Result Value Ref Range   Magnesium 2.4 1.7 - 2.4 mg/dL  Glucose, capillary     Status: Abnormal   Collection Time: 04/29/20  3:33 AM  Result Value Ref Range   Glucose-Capillary 124 (H) 70 - 99 mg/dL  Glucose, capillary     Status: Abnormal   Collection Time: 04/29/20  7:34 AM  Result Value Ref Range   Glucose-Capillary 139 (H) 70 - 99 mg/dL    Assessment & Plan: The plan of care was discussed with the bedside nurse for the day, Jen C, who is in agreement with this plan and no additional concerns were raised.   Present  on Admission: **None**    LOS: 12 days   Additional comments:I reviewed the patient's new clinical lab test results.   and I reviewed the patients new imaging test results.    [facility]  TBISAH,IPH- NSGY c/s, Dr. Jake Samples, stable head CT, hold keppra in light of intra-op rash of unclear etiology after admin, MRIyest with g2 DAI, expecting if recovery occurs that it will be weeks to months. Intermittently f/c this AM Facial fxs- ENT c/s, Dr. Pollyann Kennedy, non-op management Multiple L rib fxs (5-11, 4-7) with PTX, b/l pulm contusions- IS, pulm toilet, pain control,weaning vent VDRF- continuewean,PSV/TC as tolerated. Trach 12/24. Did 1 hour of Trach collar yesterday but became agitated and hypoxic. Hemoperitoneum- s/p exlap with repair of SB mesenteric laceration x2 by Dr. Andrey Campanile, also noted mesenteric hematoma Dislodged PEG tube - NG tube in place to LIS, no TF, Foley inserted into prior tract, pending contrasted study to eval position  ComminutedR femur fx - Ortho c/s, Dr. Jena Gauss, to OR 12/17 Open R tib/fib fxs- Ortho c/s, Dr. Jena Gauss, to OR 12/17, s/p ancef in TB, but transitioned to levaquin after intra-op rash of unclear etiology after admin ABLA-hct stable FEN -PEG 12/24, dislodged as above, pending study, strict NPO, abdominal binder DVT - SCDs,SQHOKby NSGY Dispo -ICU, PT/OT/SLP  Critical Care Total Time: 50 minutes  Diamantina Monks, MD Trauma & General Surgery Please use [email or web address] to contact on call provider  04/29/2020  *Care during the described time interval was provided by me. I have reviewed this patient's available data, including medical history, events of note, physical examination and test results as part of my evaluation.

## 2020-04-29 NOTE — Progress Notes (Signed)
Trach sutures removed per verbal order from Trauma MD.

## 2020-04-29 NOTE — Procedures (Signed)
Interventional Radiology Procedure Note  Procedure: Re-placement of percutaneous gastrostomy. New balloon retention 6F placed.   Complications: None  Recommendations: - OK to use      Signed,   Yvone Neu. Loreta Ave, DO

## 2020-04-30 ENCOUNTER — Inpatient Hospital Stay (HOSPITAL_COMMUNITY): Payer: Managed Care, Other (non HMO)

## 2020-04-30 LAB — GLUCOSE, CAPILLARY
Glucose-Capillary: 141 mg/dL — ABNORMAL HIGH (ref 70–99)
Glucose-Capillary: 142 mg/dL — ABNORMAL HIGH (ref 70–99)
Glucose-Capillary: 132 mg/dL — ABNORMAL HIGH (ref 70–99)
Glucose-Capillary: 121 mg/dL — ABNORMAL HIGH (ref 70–99)
Glucose-Capillary: 109 mg/dL — ABNORMAL HIGH (ref 70–99)
Glucose-Capillary: 125 mg/dL — ABNORMAL HIGH (ref 70–99)

## 2020-04-30 LAB — CULTURE, RESPIRATORY W GRAM STAIN: Special Requests: NORMAL

## 2020-04-30 MED ORDER — OXYCODONE HCL 5 MG/5ML PO SOLN
5.0000 mg | Freq: Four times a day (QID) | ORAL | Status: DC | PRN
  Administered 2020-04-30: 10 mg
  Administered 2020-05-01: 5 mg
  Administered 2020-05-02: 10 mg
  Administered 2020-05-02: 5 mg
  Administered 2020-05-03 – 2020-05-09 (×11): 10 mg
  Filled 2020-04-30: qty 5
  Filled 2020-04-30 (×5): qty 10
  Filled 2020-04-30: qty 5
  Filled 2020-04-30 (×8): qty 10

## 2020-04-30 MED ORDER — MORPHINE SULFATE (PF) 2 MG/ML IV SOLN
2.0000 mg | INTRAVENOUS | Status: DC | PRN
Start: 2020-04-30 — End: 2020-05-07
  Administered 2020-04-30 – 2020-05-07 (×12): 2 mg via INTRAVENOUS
  Filled 2020-04-30 (×12): qty 1

## 2020-04-30 NOTE — Evaluation (Signed)
Passy-Muir Speaking Valve - Evaluation Patient Details  Name: Adam Bates MRN: [id number] Date of Birth: 1970/05/13  Today's Date: 04/30/2020 Time: 0930-1000 SLP Time Calculation (min) (ACUTE ONLY): 30 min  Past Medical History:  Past Medical History:  Diagnosis Date  . ADHD   . Depression   . TBI (traumatic brain injury) (HCC) 1999   Past Surgical History:  Past Surgical History:  Procedure Laterality Date  . FEMUR IM NAIL Right 04/18/2020   Procedure: INTRAMEDULLARY (IM) NAIL FEMORAL;  Surgeon: Adam Lofts, MD;  Location: [facility] OR;  Service: Orthopedics;  Laterality: Right;  . LAPAROTOMY N/A 04/17/2020   Procedure: EXPLORATORY LAPAROTOMY with repair of small bowel mesentery laceration x2;  Surgeon: Adam Adu, MD;  Location: [facility] OR;  Service: General;  Laterality: N/A;  . PEG PLACEMENT N/A 04/25/2020   Procedure: PERCUTANEOUS ENDOSCOPIC GASTROSTOMY (PEG) PLACEMENT;  Surgeon: Adam Monks, MD;  Location: [facility] OR;  Service: General;  Laterality: N/A;  . TIBIA IM NAIL INSERTION Right 04/18/2020   Procedure: INTRAMEDULLARY (IM) NAIL TIBIAL;  Surgeon: Adam Lofts, MD;  Location: [facility] OR;  Service: Orthopedics;  Laterality: Right;  . TRACHEOSTOMY TUBE PLACEMENT N/A 04/25/2020   Procedure: TRACHEOSTOMY;  Surgeon: Adam Monks, MD;  Location: [facility] OR;  Service: General;  Laterality: N/A;   HPI:  Pt is a 49 y.o. male s/p [facility] who sustained TBI, SAH, IPH with DAI. facial fx -no-op, L rib fx 5-11, 4-7 with PTX and bilat pulm contusions, s/pe xlap with repair of SB mesenteric lac x2 and, comminuted R femur fx and R tib/fib fracture - s/p ORIF. R LE NWB.  Pt intubated. Trach/PEG12/24. 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 Trach collar 12/28.   Assessment / Plan / Recommendation Clinical Impression  Pt participated in [facility] evaluation. RT present for tracheal suctioning, removing thick, tan secretions; cuff was deflated by SLP.  Pt had been talking  around trach since weaned to ATC last night per RN and wife at bedside.  PMV placed; pt easily achieved good quality voice.  (He was still attaining strong phonation when valve was removed.) VS remained stable.  There were no signs of air trapping upon intermittent removal of valve. He was intermittently talking and sleeping; required max cues to awaken and answer questions.  Output was fluent but incoherent/confused.  Purpose and function of valve were explained to Adam Bates. Reviewed recommendations to begin to use valve when staff is present for supervision; SLP will follow for swallow assessment and TBI eval.  D/W RN. SLP Visit Diagnosis: Aphonia (R49.1)    SLP Assessment  Patient needs continued Speech Lanaguage Pathology Services    Follow Up Recommendations  Inpatient Rehab    Frequency and Duration min 2x/week  2 weeks    PMSV Trial PMSV was placed for: 25 Able to redirect subglottic air through upper airway: Yes Able to Attain Phonation: Yes Voice Quality: Normal Able to Expectorate Secretions: No attempts Breath Support for Phonation: Mildly decreased Respirations During Trial: (!) 37 SpO2 During Trial: 98 % Pulse During Trial: 89 Behavior: Alert;Confused   Tracheostomy Tube       Vent Dependency  FiO2 (%): 40 %    Cuff Deflation Trial   Tolerated Cuff Deflation: Yes Length of Time for Cuff Deflation Trial: 30 Behavior: Confused        Adam Bates Adam Bates 04/30/2020, 10:16 AM Adam Folks L. Samson Frederic, MA CCC/SLP Acute Rehabilitation Services Office number [phone] Pager [phone]

## 2020-04-30 NOTE — Progress Notes (Signed)
Trauma/Critical Care Follow Up Note  Subjective:    Overnight Issues: Peg replaced in IR yesterday. Delirium overnight, tolerating trach collar since yesterday afternoon.   Objective:  Vital signs for last 24 hours: Temp:  [99.2 F (37.3 C)-101.4 F (38.6 C)] 99.7 F (37.6 C) (12/29 0759) Pulse Rate:  [65-95] 71 (12/29 0700) Resp:  [23-40] 27 (12/29 0700) BP: (85-117)/(55-80) 92/60 (12/29 0700) SpO2:  [92 %-99 %] 97 % (12/29 0700) FiO2 (%):  [30 %-60 %] 40 % (12/29 0400) Weight:  [105.5 kg] 105.5 kg (12/29 0500)  Hemodynamic parameters for last 24 hours:    Intake/Output from previous day: 12/28 0701 - 12/29 0700 In: 2233.7 [I.V.:532.7; NG/GT:1085; IV Piggyback:616] Out: 1300 [Urine:1300]  Intake/Output this shift: No intake/output data recorded.  Vent settings for last 24 hours: Vent Mode: PSV;CPAP FiO2 (%):  [30 %-60 %] 40 % PEEP:  [5 cmH20] 5 cmH20 Pressure Support:  [5 cmH20] 5 cmH20  Physical Exam:  Gen: comfortable, no distress Neuro: follows commands, answering some y/n questions and speaking some sentences HEENT: trached Neck: supple CV: RRR Pulm: unlabored breathing, +secretions Abd: soft, nontender, PEG in place, incision c/d/i no cellulitis or drainage GU: clear, yellow urine Extr: wwp, no edema, RLE splint   Results for orders placed or performed during the hospital encounter of 04/17/20 (from the past 24 hour(s))  Glucose, capillary     Status: Abnormal   Collection Time: 04/29/20 11:35 AM  Result Value Ref Range   Glucose-Capillary 112 (H) 70 - 99 mg/dL  Glucose, capillary     Status: Abnormal   Collection Time: 04/29/20  3:36 PM  Result Value Ref Range   Glucose-Capillary 131 (H) 70 - 99 mg/dL  I-STAT 7, (LYTES, BLD GAS, ICA, H+H)     Status: Abnormal   Collection Time: 04/29/20  6:03 PM  Result Value Ref Range   pH, Arterial 7.431 7.350 - 7.450   pCO2 arterial 38.4 32.0 - 48.0 mmHg   pO2, Arterial 78 (L) 83.0 - 108.0 mmHg   Bicarbonate  25.3 20.0 - 28.0 mmol/L   TCO2 26 22 - 32 mmol/L   O2 Saturation 95.0 %   Acid-Base Excess 1.0 0.0 - 2.0 mmol/L   Sodium 148 (H) 135 - 145 mmol/L   Potassium 3.8 3.5 - 5.1 mmol/L   Calcium, Ion 1.21 1.15 - 1.40 mmol/L   HCT 24.0 (L) 39.0 - 52.0 %   Hemoglobin 8.2 (L) 13.0 - 17.0 g/dL   Patient temperature 725.3 F    Sample type ARTERIAL   Glucose, capillary     Status: Abnormal   Collection Time: 04/29/20  7:35 PM  Result Value Ref Range   Glucose-Capillary 130 (H) 70 - 99 mg/dL  Glucose, capillary     Status: Abnormal   Collection Time: 04/29/20 11:28 PM  Result Value Ref Range   Glucose-Capillary 140 (H) 70 - 99 mg/dL  Glucose, capillary     Status: Abnormal   Collection Time: 04/30/20  3:25 AM  Result Value Ref Range   Glucose-Capillary 141 (H) 70 - 99 mg/dL  Glucose, capillary     Status: Abnormal   Collection Time: 04/30/20  7:56 AM  Result Value Ref Range   Glucose-Capillary 142 (H) 70 - 99 mg/dL    Assessment & Plan: The plan of care was discussed with the bedside nurse for the day, Jen C, who is in agreement with this plan and no additional concerns were raised.   Present on Admission: **None**  LOS: 13 days   Additional comments:I reviewed the patient's new clinical lab test results.   and I reviewed the patients new imaging test results.    [facility]  TBISAH,IPH- NSGY c/s, Dr. Reatha Armour, stable head CT, hold keppra in light of intra-op rash of unclear etiology after admin, MRIyest with g2 DAI, expecting if recovery occurs that it will be weeks to months. F/c and some verbal interaction this AM Facial fxs- ENT c/s, Dr. Constance Holster, non-op management Multiple L rib fxs (5-11, 4-7) with PTX, b/l pulm contusions- IS, pulm toilet, pain control,trach collar trials VDRF- continuewean,PSV/TC as tolerated. Trach 12/24. Trach collar all night. Hemoperitoneum- s/p exlap with repair of SB mesenteric laceration x2 by Dr. Redmond Pulling, also noted mesenteric hematoma Dislodged PEG  tube - replaced in IR 12/28 ComminutedR femur fx - Ortho c/s, Dr. Doreatha Martin, to OR 12/17 Open R tib/fib fxs- Ortho c/s, Dr. Doreatha Martin, to OR 12/17, s/p ancef in TB, but transitioned to levaquin after intra-op rash of unclear etiology after admin ABLA-hct stable FEN -TF ID- fevers improving, on cefepime 12/27 for HCAP and vanc added 12/28 for + staph blood cx DVT - SCDs,SQHOKby NSGY Dispo -ICU, PT/OT/SLP  Critical Care Total Time: 45 minutes  Clovis Riley, MD Trauma & General Surgery Please use [email or web address] to contact on call provider  04/30/2020  *Care during the described time interval was provided by me. I have reviewed this patient's available data, including medical history, events of note, physical examination and test results as part of my evaluation.

## 2020-04-30 NOTE — Progress Notes (Signed)
Upon routine check I was unable to pass suction catheter through trach. MD paged & Dr Fredricka Bonine came & looked with bronchoscope. Was able to successfully get back into correct position.

## 2020-04-30 NOTE — Progress Notes (Addendum)
Occupational Therapy Treatment Patient Details Name: Adam Bates MRN: [id number] DOB: 1970/06/26 Today's Date: 04/30/2020    History of present illness Pt is a 49yo male s/p [facility] who sustained TBI, SAH, IPH with DAI. facial fx -no-op, L rib fx 5-11, 4-7 with PTX and bilat pulm contusions, s/pe xlap with repair of SB mesenteric lac x2 and, comminuted R femur fx and R tib/fib fracture - s/p ORIF. R LE NWB.  Pt intubated. Pt trached and pegged on 12/24. 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   OT comments  Pt more restless today, even after progression to EOB, tolerating sitting EOB approx 10 min. He continues to require totalA+2 for all aspect of mobility and for static balance. Pt with minimal to no command follow today but does respond to name and attempting to communicate with therapists, speaking over trach (at times coherent). pt impulsive to pull/fidget with lines/leads most notably with LUE. VSS throughout on trach collar (10L, 40%). Feel POC remains most appropriate at this time. Will continue to follow acutely.   Follow Up Recommendations  CIR    Equipment Recommendations  Other (comment) (TBD)          Precautions / Restrictions Precautions Precautions: Fall Precaution Comments: TBI, peg, rectal pouch, male purwick, trach/vent Required Braces or Orthoses: Other Brace Other Brace: PRAFO on R foot - sore on top of foot so left off and notified RN Restrictions Weight Bearing Restrictions: Yes RLE Weight Bearing: Non weight bearing       Mobility Bed Mobility Overal bed mobility: Needs Assistance Bed Mobility: Supine to Sit;Sit to Supine     Supine to sit: Total assist;+2 for physical assistance;+2 for safety/equipment;HOB elevated Sit to supine: Total assist;+2 for safety/equipment;HOB elevated;+2 for physical assistance   General bed mobility comments: Total +2 for all aspects, use of helicopter method to EOB with heavy posterior  truncal leaning appreciated.  Transfers                 General transfer comment: unable at this time    Balance Overall balance assessment: Needs assistance Sitting-balance support: Feet supported;Bilateral upper extremity supported Sitting balance-Leahy Scale: Zero Sitting balance - Comments: max-total posterior assist to maintain upright sitting, pt kicking truncal extensor tone especially with fatigue. EOB sitting x15 minutes Postural control: Posterior lean                                 ADL either performed or assessed with clinical judgement   ADL Overall ADL's : Needs assistance/impaired                                       General ADL Comments: remains totalA                       Cognition Arousal/Alertness: Awake/alert Behavior During Therapy: Restless Overall Cognitive Status: Impaired/Different from baseline Area of Impairment: Following commands;Rancho level               Rancho Levels of Cognitive Functioning [place] [employer] Scales of Cognitive Functioning: Generalized response (II to III)       Following Commands: Follows one step commands inconsistently       General Comments: Pt with no observed command following today with multiple attempts. Pt restless and  reaching for trach/IVs, requiring frequent redirection and physically restraining LUE at times. Pt stating yes/no to questions, and speaking throughout session but difficult to understand. Pt makes automatic statements today, for example "that'll be alright".        Exercises General Exercises - Lower Extremity Quad Sets: PROM;Right;5 reps;Supine Heel Slides: PROM;Right;5 reps;Supine   Shoulder Instructions       General Comments bloody secretions from trach site x3 during session, 40% on 10L    Pertinent Vitals/ Pain       Pain Assessment: Faces Faces Pain Scale: Hurts little more Pain Location: generalized Pain Descriptors /  Indicators: Grimacing Pain Intervention(s): Monitored during session;Limited activity within patient's tolerance;Repositioned  Home Living                                          Prior Functioning/Environment              Frequency  Min 2X/week        Progress Toward Goals  OT Goals(current goals can now be found in the care plan section)  Progress towards OT goals: Progressing toward goals  Acute Rehab OT Goals Patient Stated Goal: unable to state OT Goal Formulation: Patient unable to participate in goal setting Time For Goal Achievement: 05/12/20 Potential to Achieve Goals: Good ADL Goals Pt Will Perform Grooming: with mod assist;sitting Pt/caregiver will Perform Home Exercise Program: Increased strength;Increased ROM;Both right and left upper extremity;With minimal assist;With written HEP provided Additional ADL Goal #1: Pt will follow 1 step commands with >75% accuracy during ADL/functional task. Additional ADL Goal #2: Pt will maintain eyes open >5 min during functional task. Additional ADL Goal #3: Pt will maintain sitting balance EOB >5 min with modA.  Plan Discharge plan remains appropriate    Co-evaluation    PT/OT/SLP Co-Evaluation/Treatment: Yes Reason for Co-Treatment: Complexity of the patient's impairments (multi-system involvement);For patient/therapist safety;To address functional/ADL transfers;Necessary to address cognition/behavior during functional activity PT goals addressed during session: Mobility/safety with mobility;Balance;Strengthening/ROM OT goals addressed during session: Strengthening/ROM      AM-PAC OT "6 Clicks" Daily Activity     Outcome Measure   Help from another person eating meals?: Total Help from another person taking care of personal grooming?: Total Help from another person toileting, which includes using toliet, bedpan, or urinal?: Total Help from another person bathing (including washing, rinsing,  drying)?: Total Help from another person to put on and taking off regular upper body clothing?: Total Help from another person to put on and taking off regular lower body clothing?: Total 6 Click Score: 6    End of Session Equipment Utilized During Treatment: Oxygen  OT Visit Diagnosis: Muscle weakness (generalized) (M62.81);Other abnormalities of gait and mobility (R26.89);Other symptoms and signs involving cognitive function   Activity Tolerance Patient tolerated treatment well   Patient Left in bed;with call bell/phone within reach;with bed alarm set;with restraints reapplied   Nurse Communication Mobility status        Time: 5102-5852 OT Time Calculation (min): 38 min  Charges: OT General Charges $OT Visit: 1 Visit OT Treatments $Self Care/Home Management : 8-22 mins   Marcy Siren, OT Acute Rehabilitation Services Pager [phone] Office [phone]    Orlando Penner 04/30/2020, 5:42 PM

## 2020-04-30 NOTE — Progress Notes (Signed)
Physical Therapy Treatment Patient Details Name: Adam Bates MRN: [id number] DOB: 15-Sep-1970 Today's Date: 04/30/2020    History of Present Illness Pt is a 49yo male s/p [facility] who sustained TBI, SAH, IPH with DAI. facial fx -no-op, L rib fx 5-11, 4-7 with PTX and bilat pulm contusions, s/pe xlap with repair of SB mesenteric lac x2 and, comminuted R femur fx and R tib/fib fracture - s/p ORIF. R LE NWB.  Pt intubated. Pt trached and pegged on 12/24. 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    PT Comments    Pt restless upon arrival to room, more noticeable when pt's LUE restraint removed as pt was reaching for IVs/trach throughout session. Pt continuing to require total +2 assist for mobility tasks, but pt attempting verbal interaction with PT and OT throughout session. Pt observed saying "thank you", "no maam", and "that'll be alright" appropriately today. No command following present this session. Pt tolerated EOB sitting x15 minutes with posterior support, no righting reactions noted in multi-directions. PT to continue to progress mobility as tolerated.   RRmax 37 breaths/min HRmax 110s SpO2 WFL 10L/40%   Follow Up Recommendations  CIR     Equipment Recommendations   (defer to post acute)    Recommendations for Other Services       Precautions / Restrictions Precautions Precautions: Fall Precaution Comments: TBI, peg, rectal pouch, male purwick, trach/vent Required Braces or Orthoses: Other Brace Other Brace: PRAFO on R foot - sore on top of foot so left off and notified RN Restrictions Weight Bearing Restrictions: Yes RLE Weight Bearing: Non weight bearing    Mobility  Bed Mobility Overal bed mobility: Needs Assistance Bed Mobility: Supine to Sit;Sit to Supine     Supine to sit: Total assist;+2 for physical assistance;+2 for safety/equipment;HOB elevated Sit to supine: Total assist;+2 for safety/equipment;HOB elevated;+2 for physical  assistance   General bed mobility comments: Total +2 for all aspects, use of helicopter method to EOB with heavy posterior truncal leaning appreciated.  Transfers                 General transfer comment: unable at this time  Ambulation/Gait             General Gait Details: unable at this time   Stairs             Wheelchair Mobility    Modified Rankin (Stroke Patients Only)       Balance Overall balance assessment: Needs assistance Sitting-balance support: Feet supported;Bilateral upper extremity supported Sitting balance-Leahy Scale: Zero Sitting balance - Comments: max-total posterior assist to maintain upright sitting, pt kicking truncal extensor tone especially with fatigue. EOB sitting x15 minutes Postural control: Posterior lean                                  Cognition Arousal/Alertness: Awake/alert Behavior During Therapy: Restless Overall Cognitive Status: Impaired/Different from baseline Area of Impairment: Following commands;Rancho level               Rancho Levels of Cognitive Functioning [place] [employer] Scales of Cognitive Functioning: Localized response       Following Commands: Follows one step commands inconsistently       General Comments: Pt with no observed command following today with multiple attempts. Pt restless and reaching for trach/IVs, requiring frequent redirection and physically restraining LUE at times. Pt stating yes/no to  questions, and speaking throughout session but difficult to understand. Pt makes automatic statements today, for example "that'll be alright".      Exercises General Exercises - Lower Extremity Quad Sets: PROM;Right;5 reps;Supine Heel Slides: PROM;Right;5 reps;Supine    General Comments General comments (skin integrity, edema, etc.): bloody secretions from trach site x3 during session, 40% on 10L      Pertinent Vitals/Pain Pain Assessment: Faces Faces Pain Scale:  Hurts little more Pain Location: generalized Pain Descriptors / Indicators: Grimacing Pain Intervention(s): Limited activity within patient's tolerance;Monitored during session;Repositioned    Home Living                      Prior Function            PT Goals (current goals can now be found in the care plan section) Acute Rehab PT Goals Patient Stated Goal: unable to state PT Goal Formulation: With family Time For Goal Achievement: 05/11/20 Potential to Achieve Goals: Fair Progress towards PT goals: Progressing toward goals    Frequency    Min 4X/week      PT Plan Current plan remains appropriate    Co-evaluation PT/OT/SLP Co-Evaluation/Treatment: Yes Reason for Co-Treatment: Complexity of the patient's impairments (multi-system involvement);For patient/therapist safety;To address functional/ADL transfers;Necessary to address cognition/behavior during functional activity PT goals addressed during session: Mobility/safety with mobility;Balance;Strengthening/ROM        AM-PAC PT "6 Clicks" Mobility   Outcome Measure  Help needed turning from your back to your side while in a flat bed without using bedrails?: Total Help needed moving from lying on your back to sitting on the side of a flat bed without using bedrails?: Total Help needed moving to and from a bed to a chair (including a wheelchair)?: Total Help needed standing up from a chair using your arms (e.g., wheelchair or bedside chair)?: Total Help needed to walk in hospital room?: Total Help needed climbing 3-5 steps with a railing? : Total 6 Click Score: 6    End of Session Equipment Utilized During Treatment: Oxygen (vent trach) Activity Tolerance: Patient tolerated treatment well Patient left: in bed;with call bell/phone within reach;with bed alarm set;with restraints reapplied Nurse Communication: Mobility status PT Visit Diagnosis: Muscle weakness (generalized) (M62.81);Difficulty in walking, not  elsewhere classified (R26.2)     Time: 9211-9417 PT Time Calculation (min) (ACUTE ONLY): 37 min  Charges:  $Neuromuscular Re-education: 8-22 mins                     Marye Round, PT Acute Rehabilitation Services Pager [phone]  Office [phone]   Adam Bates 04/30/2020, 5:05 PM

## 2020-04-30 NOTE — Progress Notes (Signed)
Notified that patient's trach seems to be dislodged as suction tubing cannot pass the end of the trach and patient is expectorating secretions through his mouth. Attempted to replace blindly with obturator without success- seems to be a false passage anteriorly. Trach replaced over bronchoscope and placement confirmed bronchoscopically.

## 2020-04-30 NOTE — Progress Notes (Signed)
Nutrition Follow-up  DOCUMENTATION CODES:   Not applicable  INTERVENTION:   -Tube Feeding per PEG:   Pivot 1.5 @ 70 ml/hr (1680 ml per day)  Provides 2520 kcal, 157 gm protein, 1275 ml free water daily  NUTRITION DIAGNOSIS:   Increased nutrient needs related to acute illness as evidenced by estimated needs.  Ongoing  GOAL:   Patient will meet greater than or equal to 90% of their needs  Progressing   MONITOR:   TF tolerance  REASON FOR ASSESSMENT:   Consult Enteral/tube feeding initiation and management  ASSESSMENT:   Pt with PMH of TBI 20 years ago admitted after [facility] with bilateral SAH/TBI, facial fxs, multiple L rib fxs with PTX, bilateral pulmonary contusions, hemoperitoneum s/p ex lap with repair or SB mesenteric laceration x 2, mesenteric hematoma, comminuted R femur fx, and open right tib/fib fxs.  Pt discussed during ICU rounds and with RN.  Per RN plan to evaluate swallow with SLP tomorrow.  Pt has been on trach collar and tolerating.   12/22- cortrak tube placed (tip of tube in stomach) 12/24- s/p Procedure: percutaneous tracheostomy with bronchoscopic assistance, percutaneous endoscopic gastrostomy tube placement 12/25- TF re-started, rectal pouch placed    Stool output: 750 ml x 24 hours via rectal pouch  Medications reviewed and include precedex.   Labs reviewed: CBGS: 130-142  Diet Order:   Diet Order            Diet NPO time specified  Diet effective midnight                 EDUCATION NEEDS:   No education needs have been identified at this time  Skin:  Skin Assessment: Skin Integrity Issues: Skin Integrity Issues:: Other (Comment),Incisions Incisions: closed abdomen, neck, and rt leg Other: rt eye laceration  Last BM:  250 ml x 24 hr  Height:   Ht Readings from Last 1 Encounters:  04/19/20 5\' 9"  (1.753 m)    Weight:   Wt Readings from Last 1 Encounters:  04/30/20 105.5 kg    Ideal Body Weight:  72.7 kg  BMI:  Body  mass index is 34.35 kg/m.  Estimated Nutritional Needs:   Kcal:  2300-2500  Protein:  140-160 grams  Fluid:  >2 L/day   05/02/20., RD, LDN, CNSC See AMiON for contact information

## 2020-05-01 LAB — CULTURE, BLOOD (ROUTINE X 2): Special Requests: ADEQUATE

## 2020-05-01 LAB — GLUCOSE, CAPILLARY
Glucose-Capillary: 118 mg/dL — ABNORMAL HIGH (ref 70–99)
Glucose-Capillary: 116 mg/dL — ABNORMAL HIGH (ref 70–99)
Glucose-Capillary: 123 mg/dL — ABNORMAL HIGH (ref 70–99)
Glucose-Capillary: 103 mg/dL — ABNORMAL HIGH (ref 70–99)
Glucose-Capillary: 107 mg/dL — ABNORMAL HIGH (ref 70–99)
Glucose-Capillary: 100 mg/dL — ABNORMAL HIGH (ref 70–99)

## 2020-05-01 MED ORDER — ORAL CARE MOUTH RINSE
15.0000 mL | Freq: Two times a day (BID) | OROMUCOSAL | Status: DC
  Administered 2020-05-01 – 2020-05-09 (×17): 15 mL via OROMUCOSAL

## 2020-05-01 MED ORDER — CHLORHEXIDINE GLUCONATE 0.12 % MT SOLN
15.0000 mL | Freq: Two times a day (BID) | OROMUCOSAL | Status: DC
  Administered 2020-05-01 – 2020-05-09 (×16): 15 mL via OROMUCOSAL
  Filled 2020-05-01 (×13): qty 15

## 2020-05-01 NOTE — Evaluation (Signed)
Clinical/Bedside Swallow Evaluation Patient Details  Name: Adam Bates MRN: [id number] Date of Birth: 1971-02-06  Today's Date: 05/01/2020 Time: SLP Start Time (ACUTE ONLY): 1444 SLP Stop Time (ACUTE ONLY): 1456 SLP Time Calculation (min) (ACUTE ONLY): 12 min  Past Medical History:  Past Medical History:  Diagnosis Date  . ADHD   . Depression   . TBI (traumatic brain injury) (HCC) 1999   Past Surgical History:  Past Surgical History:  Procedure Laterality Date  . FEMUR IM NAIL Right 04/18/2020   Procedure: INTRAMEDULLARY (IM) NAIL FEMORAL;  Surgeon: Roby Lofts, MD;  Location: [facility] OR;  Service: Orthopedics;  Laterality: Right;  . LAPAROTOMY N/A 04/17/2020   Procedure: EXPLORATORY LAPAROTOMY with repair of small bowel mesentery laceration x2;  Surgeon: Gaynelle Adu, MD;  Location: [facility] OR;  Service: General;  Laterality: N/A;  . PEG PLACEMENT N/A 04/25/2020   Procedure: PERCUTANEOUS ENDOSCOPIC GASTROSTOMY (PEG) PLACEMENT;  Surgeon: Diamantina Monks, MD;  Location: [facility] OR;  Service: General;  Laterality: N/A;  . TIBIA IM NAIL INSERTION Right 04/18/2020   Procedure: INTRAMEDULLARY (IM) NAIL TIBIAL;  Surgeon: Roby Lofts, MD;  Location: [facility] OR;  Service: Orthopedics;  Laterality: Right;  . TRACHEOSTOMY TUBE PLACEMENT N/A 04/25/2020   Procedure: TRACHEOSTOMY;  Surgeon: Diamantina Monks, MD;  Location: [facility] OR;  Service: General;  Laterality: N/A;   HPI:  Pt is a 49 y.o. male s/p [facility] who sustained TBI, SAH, IPH with DAI. facial fx -no-op, L rib fx 5-11, 4-7 with PTX and bilat pulm contusions, s/pe xlap with repair of SB mesenteric lac x2 and, comminuted R femur fx and R tib/fib fracture - s/p ORIF. R LE NWB.  Pt intubated. Trach/PEG12/24. 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 Trach collar 12/28. Trach dislodged and pt was ultimately decannulated by [facility] 12/30.   Assessment / Plan / Recommendation Clinical Impression  Pt decannulated this am after  trach dislodged and is doing well on room air; voice is breathy/low volume.  Per RN, he has been asking for ice. Oral mechanism exam was unremarkable- no focal CN deficits.  He followed basic oral/motor commands.  Demonstrated active and functional mastication of ice chips during moments of alertness; swallow response appeared to be brisk.  There were no overt s/s of aspiration.  VS remained stable.  For today, recommend allowing occasional ice chips after oral care (6-10 at a time) and only when pt is alert. SLP will follow for safety/diet advancement vs instrumental swallow study. D/W wife and Charity fundraiser. SLP Visit Diagnosis: Dysphagia, oropharyngeal phase (R13.12)    Aspiration Risk    tba   Diet Recommendation   NPO except occasional ice chips  Medication Administration: Via alternative means    Other  Recommendations Oral Care Recommendations: Oral care prior to ice chip/H20   Follow up Recommendations Inpatient Rehab      Frequency and Duration min 2x/week          Prognosis        Swallow Study   General HPI: Pt is a 49 y.o. male s/p [facility] who sustained TBI, SAH, IPH with DAI. facial fx -no-op, L rib fx 5-11, 4-7 with PTX and bilat pulm contusions, s/pe xlap with repair of SB mesenteric lac x2 and, comminuted R femur fx and R tib/fib fracture - s/p ORIF. R LE NWB.  Pt intubated. Trach/PEG12/24. 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 Trach collar 12/28. Trach dislodged and  pt was ultimately decannulated by [facility] 12/30. Type of Study: Bedside Swallow Evaluation Previous Swallow Assessment: no Diet Prior to this Study: NPO;PEG tube Temperature Spikes Noted: No Respiratory Status: Room air History of Recent Intubation: Yes Length of Intubations (days): 8 days Date extubated:  (trach 12/24) Behavior/Cognition: Alert;Confused Oral Cavity Assessment: Within Functional Limits Oral Care Completed by SLP: No Oral Cavity - Dentition: Adequate natural  dentition Self-Feeding Abilities: Total assist Patient Positioning: Upright in bed Baseline Vocal Quality: Breathy Volitional Cough: Weak Volitional Swallow: Able to elicit    Oral/Motor/Sensory Function Overall Oral Motor/Sensory Function: Within functional limits   Ice Chips Ice chips: Within functional limits Presentation: Spoon   Thin Liquid Thin Liquid: Not tested    Nectar Thick Nectar Thick Liquid: Not tested   Honey Thick Honey Thick Liquid: Not tested   Puree Puree: Not tested   Solid     Solid: Not tested      Juan Quam Laurice 05/01/2020,3:25 PM  Estill Bamberg L. Tivis Ringer, [place] Office number [phone] Pager [phone]
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
## 2020-05-01 NOTE — Plan of Care (Signed)
  Problem: Activity: Goal: Ability to tolerate increased activity will improve Outcome: Progressing   Problem: Respiratory: Goal: Ability to maintain a clear airway and adequate ventilation will improve Outcome: Progressing   

## 2020-05-01 NOTE — Progress Notes (Signed)
RT arrived bedside to check trach. RT could not pass suction catheter. RT checked trach placement with CO2 detector with no color change even after multiple attempts of reinsertion with second RT and MD bedside. Dr.Lovick decannulated patient bedside. Patient tolerating well at this time. RT will continue to monitor as needed. RN aware and at bedside currently.

## 2020-05-01 NOTE — Progress Notes (Signed)
Trauma/Critical Care Follow Up Note  Subjective:    Overnight Issues:   Objective:  Vital signs for last 24 hours: Temp:  [98.8 F (37.1 C)-100.2 F (37.9 C)] 99 F (37.2 C) (12/30 0400) Pulse Rate:  [90-109] 98 (12/30 0600) Resp:  [22-39] 33 (12/30 0600) BP: (104-138)/(57-85) 138/79 (12/30 0600) SpO2:  [93 %-100 %] 96 % (12/30 0600) FiO2 (%):  [40 %] 40 % (12/30 0124)  Hemodynamic parameters for last 24 hours:    Intake/Output from previous day: 12/29 0701 - 12/30 0700 In: 1818.2 [I.V.:78; NG/GT:840; IV Piggyback:900.2] Out: 2000 [Urine:1400; Stool:600]  Intake/Output this shift: No intake/output data recorded.  Vent settings for last 24 hours: FiO2 (%):  [40 %] 40 %  Physical Exam:  Gen: no distress Neuro: follows commands, but delirious HEENT: trached Neck: supple CV: RRR Pulm: unlabored breathing Abd: soft, NT, PEG in place GU: clear yellow urine Extr: wwp, no edema   Results for orders placed or performed during the hospital encounter of 04/17/20 (from the past 24 hour(s))  Glucose, capillary     Status: Abnormal   Collection Time: 04/30/20 11:28 AM  Result Value Ref Range   Glucose-Capillary 132 (H) 70 - 99 mg/dL  Glucose, capillary     Status: Abnormal   Collection Time: 04/30/20  3:44 PM  Result Value Ref Range   Glucose-Capillary 121 (H) 70 - 99 mg/dL  Glucose, capillary     Status: Abnormal   Collection Time: 04/30/20  7:35 PM  Result Value Ref Range   Glucose-Capillary 109 (H) 70 - 99 mg/dL  Glucose, capillary     Status: Abnormal   Collection Time: 04/30/20 11:26 PM  Result Value Ref Range   Glucose-Capillary 125 (H) 70 - 99 mg/dL  Glucose, capillary     Status: Abnormal   Collection Time: 05/01/20  3:24 AM  Result Value Ref Range   Glucose-Capillary 118 (H) 70 - 99 mg/dL  Glucose, capillary     Status: Abnormal   Collection Time: 05/01/20  8:05 AM  Result Value Ref Range   Glucose-Capillary 116 (H) 70 - 99 mg/dL    Assessment &  Plan:  Present on Admission: **None**    LOS: 14 days   Additional comments:I reviewed the patient's new clinical lab test results.   and I reviewed the patients new imaging test results.    [facility]   TBISAH,IPH- NSGY c/s, Dr. Jake Samples, stable head CT, hold keppra in light of intra-op rash of unclear etiology after admin, MRIyest with g2 DAI, expecting if recovery occurs that it will be weeks to months. F/c and some verbal interaction this AM Facial fxs- ENT c/s, Dr. Pollyann Kennedy, non-op management Multiple L rib fxs (5-11, 4-7) with PTX, b/l pulm contusions- IS, pulm toilet, pain control,trach collar trials VDRF- continuewean,PSV/TC as tolerated. Trach 12/24. Trach dislodged o/n, replaced over bronch. At the time of patient exam today, trach had become dislodged, replaced and patient with good cough, but no color change on colorimeter. Sats remains normal despite concern for dislodged trach. Initial plan fro replacement of trach with XLT trach, but patient appears to be able to safely protect his airway and the decision was made to decannulate rather than re-intubate with ETT or replace trach over bronch again.  Hemoperitoneum- s/p exlap with repair of SB mesenteric laceration x2 by Dr. Andrey Campanile, also noted mesenteric hematoma Dislodged PEG tube - replaced in IR 12/28 ComminutedR femur fx - Ortho c/s, Dr. Jena Gauss, to OR 12/17 Open R tib/fib fxs-  Ortho c/s, Dr. Jena Gauss, to OR 12/17, s/p ancef in TB, but transitioned to levaquin after intra-op rash of unclear etiology after admin ABLA-hct stable FEN -TF ID- fevers improving, on cefepime 12/27 for HCAP and vanc added 12/28 for + staph blood cx DVT - SCDs,SQHOKby NSGY Dispo -med surg, PT/OT/SLP  Critical care time:  Diamantina Monks, MD Trauma & General Surgery Please use [email or web address] to contact on call provider  05/01/2020  *Care during the described time interval was provided by me. I have reviewed this patient's available  data, including medical history, events of note, physical examination and test results as part of my evaluation.
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
## 2020-05-02 ENCOUNTER — Encounter (HOSPITAL_COMMUNITY): Payer: Self-pay

## 2020-05-02 DIAGNOSIS — S72141D Displaced intertrochanteric fracture of right femur, subsequent encounter for closed fracture with routine healing: Secondary | ICD-10-CM

## 2020-05-02 LAB — GLUCOSE, CAPILLARY
Glucose-Capillary: 136 mg/dL — ABNORMAL HIGH (ref 70–99)
Glucose-Capillary: 114 mg/dL — ABNORMAL HIGH (ref 70–99)
Glucose-Capillary: 95 mg/dL (ref 70–99)
Glucose-Capillary: 95 mg/dL (ref 70–99)

## 2020-05-02 LAB — VANCOMYCIN, TROUGH: Vancomycin Tr: 9 ug/mL — ABNORMAL LOW (ref 15–20)

## 2020-05-02 MED ORDER — QUETIAPINE FUMARATE 100 MG PO TABS
100.0000 mg | ORAL_TABLET | Freq: Three times a day (TID) | ORAL | Status: DC
  Administered 2020-05-02 – 2020-05-09 (×20): 100 mg
  Filled 2020-05-02 (×20): qty 1

## 2020-05-02 MED ORDER — GERHARDT'S BUTT CREAM
TOPICAL_CREAM | Freq: Every day | CUTANEOUS | Status: DC | PRN
  Filled 2020-05-02 (×2): qty 1

## 2020-05-02 MED ORDER — VANCOMYCIN HCL 1250 MG/250ML IV SOLN
1250.0000 mg | Freq: Three times a day (TID) | INTRAVENOUS | Status: AC
  Administered 2020-05-02 – 2020-05-06 (×13): 1250 mg via INTRAVENOUS
  Filled 2020-05-02 (×14): qty 250
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
## 2020-05-02 NOTE — Progress Notes (Addendum)
Pharmacy Antibiotic Note  Adam Bates is a 49 y.o. male admitted on 04/17/2020 tx with vancomycin for staph epi 2/2 with mecA + on BCID, also growing MSSA in trach aspirate.  Vancomycin trough today is subtherapeutic at 9, on 1500 mg IV q12h at steady state.    Plan: Increase vancomycin to 1250 mg Iv q8h Repeat BCx Monitor renal function, Cx and LOT Vancomycin levels as indicated   Height: 5\' 9"  (175.3 cm) Weight: 102.6 kg (226 lb 3.1 oz) IBW/kg (Calculated) : 70.7  Temp (24hrs), Avg:99 F (37.2 C), Min:98.7 F (37.1 C), Max:99.9 F (37.7 C)  Recent Labs  Lab 04/28/20 0937 04/29/20 0019  WBC 16.8* 16.2*  CREATININE 0.93 0.90    Estimated Creatinine Clearance: 117.3 mL/min (by C-G formula based on SCr of 0.9 mg/dL).    Allergies  Allergen Reactions  . Ancef [Cefazolin] Hives    Potential allergy-Pt received Keppra and Levofloxacin also and pt "had developed hives on trunk/arms/legs"  . Keppra [Levetiracetam] Hives    Potential allergy-Pt received Levofloxacin and Ancef also and pt "had developed hives on trunk/arms/legs"  . Levofloxacin In D5w Hives    Potential allergy-Pt received Keppra and Ancef also and pt "had developed hives on trunk/arms/legs"  . Bee Venom Swelling    LVQ 12/17 >> 12/18 Azactam 12/17 >> 12/20 Clinda 12/17 >> 12/20 Cefepime 12/27>>12/30 Vanc 12/28>>  12/27 BCx: staph epi + mecA 2/2 12/27 TA: MSSA 12/17 MSSA pos 12/16 resp panel neg  1/17, PharmD Clinical Pharmacist Please check AMION for all [facility] Pharmacy numbers 05/02/2020 1:14 PM
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
## 2020-05-02 NOTE — Progress Notes (Signed)
Pt arrived to the unit as transfer from 4NICU. Pt drowsy from working with therapies throughout the day. No belongings at bedside. Pt's wife currently at bedside. Pt's VS stable bed in lowest position and call bell in reach.

## 2020-05-02 NOTE — Progress Notes (Signed)
Trauma/Critical Care Follow Up Note  Subjective:    Overnight Issues: Agitation squirming in the bed, feels like Seroquel wears off.  Objective:  Vital signs for last 24 hours: Temp:  [98.7 F (37.1 C)-99.9 F (37.7 C)] 98.7 F (37.1 C) (12/31 0800) Pulse Rate:  [74-105] 82 (12/31 0800) Resp:  [15-32] 29 (12/31 0800) BP: (105-135)/(64-93) 118/84 (12/31 0800) SpO2:  [96 %-100 %] 100 % (12/31 0800) Weight:  [102.6 kg] 102.6 kg (12/31 0500)  Hemodynamic parameters for last 24 hours:    Intake/Output from previous day: 12/30 0701 - 12/31 0700 In: 2480 [NG/GT:1680; IV Piggyback:800] Out: 1800 [Urine:1800]  Intake/Output this shift: Total I/O In: 70 [NG/GT:70] Out: -   Vent settings for last 24 hours:    Physical Exam:  Gen: no distress Neuro: follows commands, but delirious HEENT: trach site decannulated yesterday, still some gurgling from the wound with dressing in place Neck: supple CV: RRR Pulm: unlabored breathing Abd: soft, NT, PEG in place GU: clear yellow urine Extr: wwp, no edema   Results for orders placed or performed during the hospital encounter of 04/17/20 (from the past 24 hour(s))  Glucose, capillary     Status: Abnormal   Collection Time: 05/01/20 11:40 AM  Result Value Ref Range   Glucose-Capillary 123 (H) 70 - 99 mg/dL  Glucose, capillary     Status: Abnormal   Collection Time: 05/01/20  4:02 PM  Result Value Ref Range   Glucose-Capillary 103 (H) 70 - 99 mg/dL  Glucose, capillary     Status: Abnormal   Collection Time: 05/01/20  7:20 PM  Result Value Ref Range   Glucose-Capillary 107 (H) 70 - 99 mg/dL  Glucose, capillary     Status: Abnormal   Collection Time: 05/01/20 11:14 PM  Result Value Ref Range   Glucose-Capillary 100 (H) 70 - 99 mg/dL  Glucose, capillary     Status: Abnormal   Collection Time: 05/02/20  3:44 AM  Result Value Ref Range   Glucose-Capillary 136 (H) 70 - 99 mg/dL  Glucose, capillary     Status: Abnormal    Collection Time: 05/02/20  8:34 AM  Result Value Ref Range   Glucose-Capillary 114 (H) 70 - 99 mg/dL    Assessment & Plan:  Present on Admission: **None**    LOS: 15 days   Additional comments:I reviewed the patient's new clinical lab test results.   and I reviewed the patients new imaging test results.    [facility]   TBISAH,IPH- NSGY c/s, Dr. Jake Samples, stable head CT, hold keppra in light of intra-op rash of unclear etiology after admin, MRIyest with g2 DAI, expecting if recovery occurs that it will be weeks to months. F/c and some verbal interaction this AM Facial fxs- ENT c/s, Dr. Pollyann Kennedy, non-op management Multiple L rib fxs (5-11, 4-7) with PTX, b/l pulm contusions- IS, pulm toilet, pain control,trach collar trials VDRF- Trach 12/24.  Dislodged and replaced multiple times since admission, finally decannulated 05/01/20.  Appears to be protecting his airway hemoperitoneum- s/p exlap with repair of SB mesenteric laceration x2 by Dr. Andrey Campanile, also noted mesenteric hematoma Dislodged PEG tube - replaced in IR 12/28 ComminutedR femur fx - Ortho c/s, Dr. Jena Gauss, to OR 12/17 Open R tib/fib fxs- Ortho c/s, Dr. Jena Gauss, to OR 12/17, s/p ancef in TB, but transitioned to levaquin after intra-op rash of unclear etiology after admin ABLA-hct stable FEN -TF ID- fevers improving, on cefepime 12/27 for HCAP and vanc added 12/28 for + staph blood  cx DVT - SCDs,SQHOKby NSGY Dispo -med surg, PT/OT/SLP  Critical care time: [place], [place]  Please use [email or web address] to contact on call provider  05/02/2020  *Care during the described time interval was provided by me. I have reviewed this patient's available data, including medical history, events of note, physical examination and test results as part of my evaluation.

## 2020-05-02 NOTE — Progress Notes (Signed)
Physical Therapy Treatment Patient Details Name: Adam Bates MRN: [id number] DOB: [birth year] Today's Date: 05/02/2020    History of Present Illness Pt is a 49yo male s/p [facility] who sustained TBI, SAH, IPH with DAI. facial fx -no-op, L rib fx 5-11, 4-7 with PTX and bilat pulm contusions, s/pe xlap with repair of SB mesenteric lac x2 and, comminuted R femur fx and R tib/fib fracture - s/p ORIF. R LE NWB.  Pt intubated. Pt trached and pegged on 12/24, decannulated 12/30. 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    PT Comments    Pt pleasant, making jokes although at times incoherent and nonsensical. Pt requiring max-total +2 assist for bed mobility tasks, grimacing in pain especially when handling RLE. Pt demonstrating some behaviors verging on sexual in nature, for example attempted "play biting" and reaching for PT and OT during session. Pt lifted out of chair with maxisky and +2, tolerated well. Pt presenting as RLA level 4 at this time. Will continue to follow acutely.     Follow Up Recommendations  CIR     Equipment Recommendations   (defer to post acute)    Recommendations for Other Services       Precautions / Restrictions Precautions Precautions: Fall Precaution Comments: peg tube Required Braces or Orthoses: Other Brace Other Brace: PRAFO on R foot - sore on dorsum of foot, bandage on Restrictions Weight Bearing Restrictions: Yes RLE Weight Bearing: Non weight bearing    Mobility  Bed Mobility Overal bed mobility: Needs Assistance Bed Mobility: Rolling Rolling: Max assist;+2 for physical assistance;+2 for safety/equipment;Total assist         General bed mobility comments: rolling both L and R x2 for peri care and lift pad placement. Pt can roll to R side with max A +2 due to having more functional use of LUE to pull. Remains total A +2 to roll to L  Transfers   Equipment used: Ambulation equipment used             General  transfer comment: maxisky used to lift from bed to recliner  Ambulation/Gait                 Stairs             Wheelchair Mobility    Modified Rankin (Stroke Patients Only)       Balance Overall balance assessment: Needs assistance                                          Cognition Arousal/Alertness: Awake/alert Behavior During Therapy: Restless Overall Cognitive Status: Impaired/Different from baseline Area of Impairment: Following commands;Rancho level;Orientation;Attention;Safety/judgement;Problem solving;Awareness               Rancho Levels of Cognitive Functioning Rancho Los Amigos Scales of Cognitive Functioning: Confused/agitated (more restless than agitated) Orientation Level: Disoriented to;Place;Time;Situation (was able to say "yes" to being in [place] [facility] after max-total cues) Current Attention Level:  (20-30 sec)   Following Commands: Follows one step commands inconsistently (50%) Safety/Judgement: Decreased awareness of safety;Decreased awareness of deficits Awareness: Intellectual Problem Solving: Slow processing;Decreased initiation;Difficulty sequencing;Requires verbal cues;Requires tactile cues General Comments: pt showing some inappropriate playful behaviors such as attempted "play biting" and joking around with staff. Noted some beginnings of sexual inappropriate behavior. Inconsistently followed commands this date (about ~50% of short simple commands). Remains  restless. Sang along to rock music. Continues to be limited by language difficulties- has many nonsensical responses but also automatic responses like "adios muchacho", "that's what I'm saying", "alright"      Exercises      General Comments        Pertinent Vitals/Pain Pain Assessment: Faces Faces Pain Scale: Hurts little more Pain Location: during PROM RLE and rolling with RLE Pain Descriptors / Indicators: Grimacing;Discomfort;Guarding Pain  Intervention(s): Limited activity within patient's tolerance;Monitored during session;Repositioned    Home Living                      Prior Function            PT Goals (current goals can now be found in the care plan section) Acute Rehab PT Goals Patient Stated Goal: unable to state, agreeable to OOB PT Goal Formulation: With patient/family Time For Goal Achievement: 05/11/20 Potential to Achieve Goals: Fair    Frequency    Min 4X/week      PT Plan Current plan remains appropriate    Co-evaluation PT/OT/SLP Co-Evaluation/Treatment: Yes Reason for Co-Treatment: For patient/therapist safety;To address functional/ADL transfers;Necessary to address cognition/behavior during functional activity;Complexity of the patient's impairments (multi-system involvement) PT goals addressed during session: Mobility/safety with mobility;Balance;Strengthening/ROM OT goals addressed during session: ADL's and self-care;Proper use of Adaptive equipment and DME;Strengthening/ROM      AM-PAC PT "6 Clicks" Mobility   Outcome Measure  Help needed turning from your back to your side while in a flat bed without using bedrails?: A Lot Help needed moving from lying on your back to sitting on the side of a flat bed without using bedrails?: Total Help needed moving to and from a bed to a chair (including a wheelchair)?: Total Help needed standing up from a chair using your arms (e.g., wheelchair or bedside chair)?: Total Help needed to walk in hospital room?: Total Help needed climbing 3-5 steps with a railing? : Total 6 Click Score: 7    End of Session   Activity Tolerance: Patient tolerated treatment well Patient left: with restraints reapplied;with family/visitor present;in chair;with chair alarm set (bilateral wrist restraints, belt restraint) Nurse Communication: Mobility status PT Visit Diagnosis: Muscle weakness (generalized) (M62.81);Difficulty in walking, not elsewhere classified  (R26.2)     Time: [id number] PT Time Calculation (min) (ACUTE ONLY): 51 min  Charges:  $Therapeutic Activity: 8-22 mins                    Stacie Glaze, PT Acute Rehabilitation Services Pager [phone]  Office [phone]    Roxine Caddy E Ruffin Pyo 05/02/2020, 11:33 AM

## 2020-05-03 LAB — GLUCOSE, CAPILLARY
Glucose-Capillary: 100 mg/dL — ABNORMAL HIGH (ref 70–99)
Glucose-Capillary: 107 mg/dL — ABNORMAL HIGH (ref 70–99)
Glucose-Capillary: 112 mg/dL — ABNORMAL HIGH (ref 70–99)
Glucose-Capillary: 117 mg/dL — ABNORMAL HIGH (ref 70–99)
Glucose-Capillary: 91 mg/dL (ref 70–99)
Glucose-Capillary: 94 mg/dL (ref 70–99)
Glucose-Capillary: 97 mg/dL (ref 70–99)

## 2020-05-03 LAB — CREATININE, SERUM
Creatinine, Ser: 0.65 mg/dL (ref 0.61–1.24)
GFR, Estimated: >60 mL/min (ref >60)
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
## 2020-05-03 NOTE — Progress Notes (Signed)
Trauma/Critical Care Follow Up Note  Subjective:    Overnight Issues: Calm this morning. No acute changes.   Objective:  Vital signs for last 24 hours: Temp:  [97.4 F (36.3 C)-99.5 F (37.5 C)] 97.7 F (36.5 C) (01/01 1552) Pulse Rate:  [25-106] 93 (01/01 1700) Resp:  [19-31] 25 (01/01 1700) BP: (120-142)/(73-87) 142/87 (01/01 0734) SpO2:  [75 %-100 %] 97 % (01/01 1700)  Hemodynamic parameters for last 24 hours:    Intake/Output from previous day: 12/31 0701 - 01/01 0700 In: 1020.1 [NG/GT:560; IV Piggyback:460.1] Out: 900 [Urine:900]  Intake/Output this shift: No intake/output data recorded.  Physical Exam:  Gen: no distress Neuro: alert HEENT: dressing in place over prior trach site Neck: supple CV: RRR Pulm: unlabored breathing Abd: soft, NT, PEG in place GU: clear yellow urine Extr: wwp, no edema   Results for orders placed or performed during the hospital encounter of 04/17/20 (from the past 24 hour(s))  Glucose, capillary     Status: Abnormal   Collection Time: 05/02/20 11:58 PM  Result Value Ref Range   Glucose-Capillary 100 (H) 70 - 99 mg/dL  Glucose, capillary     Status: None   Collection Time: 05/03/20  4:15 AM  Result Value Ref Range   Glucose-Capillary 97 70 - 99 mg/dL  Glucose, capillary     Status: None   Collection Time: 05/03/20  4:33 AM  Result Value Ref Range   Glucose-Capillary 94 70 - 99 mg/dL  Creatinine, serum     Status: None   Collection Time: 05/03/20  5:09 AM  Result Value Ref Range   Creatinine, Ser 0.65 0.61 - 1.24 mg/dL   GFR, Estimated >73 >71 mL/min  Glucose, capillary     Status: Abnormal   Collection Time: 05/03/20  8:02 AM  Result Value Ref Range   Glucose-Capillary 112 (H) 70 - 99 mg/dL   Comment 1 Notify RN    Comment 2 Document in Chart   Glucose, capillary     Status: None   Collection Time: 05/03/20 12:02 PM  Result Value Ref Range   Glucose-Capillary 91 70 - 99 mg/dL  Glucose, capillary     Status:  Abnormal   Collection Time: 05/03/20  3:49 PM  Result Value Ref Range   Glucose-Capillary 107 (H) 70 - 99 mg/dL  Glucose, capillary     Status: Abnormal   Collection Time: 05/03/20  8:28 PM  Result Value Ref Range   Glucose-Capillary 117 (H) 70 - 99 mg/dL   Comment 1 Notify RN    Comment 2 Document in Chart     Assessment & Plan:  Present on Admission: **None**    LOS: 16 days   Additional comments:I reviewed the patient's new clinical lab test results.   and I reviewed the patients new imaging test results.    [facility]   TBISAH,IPH- NSGY c/s, Dr. Jake Samples, stable head CT, hold keppra in light of intra-op rash of unclear etiology after admin, MRIg2 DAI, expecting if recovery occurs that it will be weeks to months. Facial fxs- ENT c/s, Dr. Pollyann Kennedy, non-op management Multiple L rib fxs (5-11, 4-7) with PTX, b/l pulm contusions- IS, pulm toilet, pain control. VDRF- Janina Mayo 12/24.  Dislodged and replaced multiple times since admission, finally decannulated 05/01/20.  hemoperitoneum- s/p exlap with repair of SB mesenteric laceration x2 by Dr. Andrey Campanile, also noted mesenteric hematoma. Remove staples from abdominal incision today. Dislodged PEG tube - replaced in IR 12/28 ComminutedR femur fx - Ortho c/s, Dr.  Haddix, to OR 12/17 Open R tib/fib fxs- Ortho c/s, Dr. Doreatha Martin, to OR 12/17, s/p ancef in TB, but transitioned to levaquin after intra-op rash of unclear etiology after admin ABLA-hct stable FEN -TF ID- fevers resolved DVT - SCDs,SQHOKby NSGY Dispo - 4NP, PT/OT/SLP  Michaelle Birks, MD [facility] Surgery General, Hepatobiliary and Pancreatic Surgery 05/03/20 9:26 PM

## 2020-05-04 DIAGNOSIS — L8990 Pressure ulcer of unspecified site, unspecified stage: Secondary | ICD-10-CM | POA: Insufficient documentation

## 2020-05-04 LAB — GLUCOSE, CAPILLARY
Glucose-Capillary: 106 mg/dL — ABNORMAL HIGH (ref 70–99)
Glucose-Capillary: 107 mg/dL — ABNORMAL HIGH (ref 70–99)
Glucose-Capillary: 108 mg/dL — ABNORMAL HIGH (ref 70–99)
Glucose-Capillary: 116 mg/dL — ABNORMAL HIGH (ref 70–99)
Glucose-Capillary: 128 mg/dL — ABNORMAL HIGH (ref 70–99)
Glucose-Capillary: 92 mg/dL (ref 70–99)
Glucose-Capillary: 93 mg/dL (ref 70–99)

## 2020-05-04 LAB — CREATININE, SERUM
Creatinine, Ser: 0.73 mg/dL (ref 0.61–1.24)
GFR, Estimated: >60 mL/min (ref >60)

## 2020-05-04 LAB — VANCOMYCIN, TROUGH: Vancomycin Tr: 14 ug/mL — ABNORMAL LOW (ref 15–20)
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
## 2020-05-04 NOTE — Progress Notes (Signed)
Inpatient Rehab Admissions Coordinator:  Pt's wife, Judeth Cornfield returned call.  Explained CIR goals and expectations to her.  She acknowledged understanding.  She reported that she and other family members will be available to provide support for pt after hospital discharge.  She is interested in pursuing CIR for pt.  Will continue to follow.   Wolfgang Phoenix, MS, CCC-SLP Admissions Coordinator [phone]

## 2020-05-04 NOTE — Progress Notes (Signed)
Inpatient Rehab Admissions:  Inpatient Rehab Consult received.  Attempted to meet with patient at the bedside for rehabilitation assessment and to discuss goals and expectations of an inpatient rehab admission.  Pt was medicated and not alert. Spoke with pt's nurse who reported pt's wife has not been to hospital today.  Left CIR brochure in pt's room.  Called pt's wife, Judeth Cornfield.  Left voicemail.  Awaiting return call.    Signed: Wolfgang Phoenix, MS, CCC-SLP Admissions Coordinator [phone]

## 2020-05-05 ENCOUNTER — Inpatient Hospital Stay (HOSPITAL_COMMUNITY): Payer: Managed Care, Other (non HMO)

## 2020-05-05 LAB — GLUCOSE, CAPILLARY
Glucose-Capillary: 110 mg/dL — ABNORMAL HIGH (ref 70–99)
Glucose-Capillary: 114 mg/dL — ABNORMAL HIGH (ref 70–99)
Glucose-Capillary: 125 mg/dL — ABNORMAL HIGH (ref 70–99)
Glucose-Capillary: 98 mg/dL (ref 70–99)
Glucose-Capillary: 99 mg/dL (ref 70–99)
Glucose-Capillary: 99 mg/dL (ref 70–99)

## 2020-05-05 MED ORDER — VITAMIN D 25 MCG (1000 UNIT) PO TABS
1000.0000 [IU] | ORAL_TABLET | Freq: Every day | ORAL | Status: DC
  Administered 2020-05-05 – 2020-05-09 (×5): 1000 [IU]
  Filled 2020-05-05 (×5): qty 1

## 2020-05-05 NOTE — Progress Notes (Signed)
Orthopaedic Trauma Progress Note  SUBJECTIVE: Sleepy, opens eyes to pain and to voice. Following some commands for me  OBJECTIVE:  Vitals:   05/05/20 0400 05/05/20 0727  BP: 108/72 123/86  Pulse: 91 87  Resp: 17 18  Temp: 98.4 F (36.9 C) 98 F (36.7 C)  SpO2: 94% 95%    General: Sitting up, NAD Respiratory: No increased work of breathing.  Right Lower Extremity:  All incisions clean, dry, intact.  Tolerates gentle passive dorsiflexion and plantarflexion of his ankle as well as gentle passive knee motion.  + DP pulse.  IMAGING: Stable post op imaging.   ASSESSMENT: Adam Bates is a 50 y.o. male 17 days Post-Op s/p Procedure(s): INTRAMEDULLARY (IM) NAIL FEMORAL INTRAMEDULLARY (IM) NAIL TIBIAL   PLAN: Weightbearing: NWB RLE Incisional and dressing care:  Sutures out today. Ok to leave incisions open to air.  Orthopedic device(s): None  Pain management: per trauma VTE prophylaxis: Heparin, SCDs Imaging: repeat x-rays right femur and right tibia ordered today Foley/Lines: No foley, continue IVFs per trauma Impediments to Fracture Healing: Polytrauma. Vit D level 26, start supplementation Dispo: PT/OT as tolerated. Ok for d/c to CIR from ortho standpoint Follow - up plan: 2 weeks after discharge  Contact information:  Truitt Merle MD, Ulyses Southward PA-C. After hours and holidays please check [email or web address] for group call information for [facility]   Tamara Kenyon A. Michaelyn Barter, PA-C [phone] (office) [email or web address]
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
## 2020-05-05 NOTE — Progress Notes (Signed)
10 Days Post-Op  Subjective: CC: No acute change overnight. Tolerating tf's. Having bm's.   Objective: Vital signs in last 24 hours: Temp:  [97.7 F (36.5 C)-98.8 F (37.1 C)] 98 F (36.7 C) (01/03 0727) Pulse Rate:  [87-98] 87 (01/03 0727) Resp:  [17-20] 18 (01/03 0727) BP: (99-123)/(72-86) 123/86 (01/03 0727) SpO2:  [93 %-95 %] 95 % (01/03 0727) Weight:  [94.4 kg] 94.4 kg (01/03 0452) Last BM Date: 05/04/20  Intake/Output from previous day: 01/02 0701 - 01/03 0700 In: 2115.3 [NG/GT:1622.8; IV Piggyback:492.5] Out: 200 [Urine:200] Intake/Output this shift: No intake/output data recorded.  PE: Gen: no distress Neuro: alert HEENT: dressing in place over prior trach site CV: RRR Pulm: unlabored breathing, cta b/l. Abd: soft, NT, nondistended; PEG in place with TF's running. Midline wound with staples removed. There is a small opening at the base of the wound without signs of infection. Extr: wwp, no edema  Lab Results:  No results for input(s): WBC, HGB, HCT, PLT in the last 72 hours. BMET Recent Labs    05/03/20 0509 05/04/20 1122  CREATININE 0.65 0.73   PT/INR No results for input(s): LABPROT, INR in the last 72 hours. CMP     Component Value Date/Time   NA 148 (H) 04/29/2020 1803   K 3.8 04/29/2020 1803   CL 114 (H) 04/29/2020 0019   CO2 23 04/29/2020 0019   GLUCOSE 155 (H) 04/29/2020 0019   BUN 35 (H) 04/29/2020 0019   CREATININE 0.73 05/04/2020 1122   CALCIUM 7.7 (L) 04/29/2020 0019   PROT 5.1 (L) 04/28/2020 0937   ALBUMIN 1.9 (L) 04/28/2020 0937   AST 36 04/28/2020 0937   ALT 54 (H) 04/28/2020 0937   ALKPHOS 109 04/28/2020 0937   BILITOT 1.2 04/28/2020 0937   GFRNONAA >60 05/04/2020 1122   Lipase  No results found for: LIPASE     Studies/Results: No results found.  Anti-infectives: Anti-infectives (From admission, onward)   Start     Dose/Rate Route Frequency Ordered Stop   05/02/20 2000  vancomycin (VANCOREADY) IVPB 1250 mg/250 mL         1,250 mg 166.7 mL/hr over 90 Minutes Intravenous Every 8 hours 05/02/20 1327     04/30/20 1200  vancomycin (VANCOREADY) IVPB 1500 mg/300 mL  Status:  Discontinued        1,500 mg 150 mL/hr over 120 Minutes Intravenous Every 12 hours 04/29/20 2229 05/02/20 1327   04/29/20 2315  vancomycin (VANCOREADY) IVPB 2000 mg/400 mL        2,000 mg 200 mL/hr over 120 Minutes Intravenous NOW 04/29/20 2229 04/30/20 0150   04/28/20 1715  ceFEPIme (MAXIPIME) 2 g in sodium chloride 0.9 % 100 mL IVPB  Status:  Discontinued        2 g 200 mL/hr over 30 Minutes Intravenous Every 8 hours 04/28/20 1617 05/01/20 1452   04/18/20 2300  clindamycin (CLEOCIN) IVPB 900 mg       "And" Linked Group Details   900 mg 100 mL/hr over 30 Minutes Intravenous Every 8 hours 04/18/20 1829 04/21/20 1511   04/18/20 1930  aztreonam (AZACTAM) 2 g in sodium chloride 0.9 % 100 mL IVPB       "And" Linked Group Details   2 g 200 mL/hr over 30 Minutes Intravenous Every 8 hours 04/18/20 1829 04/21/20 1226   04/18/20 1620  vancomycin (VANCOCIN) powder  Status:  Discontinued          As needed 04/18/20 1620 04/18/20 1812  04/18/20 0600  levofloxacin (LEVAQUIN) IVPB 750 mg  Status:  Discontinued        750 mg 100 mL/hr over 90 Minutes Intravenous Every 24 hours 04/18/20 0204 04/19/20 1052   04/18/20 0100  cefTRIAXone (ROCEPHIN) 2 g in sodium chloride 0.9 % 100 mL IVPB  Status:  Discontinued        2 g 200 mL/hr over 30 Minutes Intravenous Every 24 hours 04/18/20 0046 04/18/20 0202   04/18/20 0030  ceFAZolin (ANCEF) IVPB 2g/100 mL premix  Status:  Discontinued        2 g 200 mL/hr over 30 Minutes Intravenous Every 8 hours 04/18/20 0024 04/18/20 0046   04/17/20 2230  ceFAZolin (ANCEF) IVPB 1 g/50 mL premix  Status:  Discontinued        1 g 100 mL/hr over 30 Minutes Intravenous  Once 04/17/20 2219 04/17/20 2230   04/17/20 2110  ceFAZolin (ANCEF) IVPB 1 g/50 mL premix        over 30 Minutes  Continuous PRN 04/17/20 2117 04/17/20  2110       Assessment/Plan [facility]   TBISAH,IPH- NSGY c/s, Dr. Reatha Armour, stable head CT, hold keppra in light of intra-op rash of unclear etiology after admin, MRIg2 DAI, expecting if recovery occurs that it will be weeks to months. Facial fxs- ENT c/s, Dr. Constance Holster, non-op management Multiple L rib fxs (5-11, 4-7) with PTX, b/l pulm contusions- IS, pulm toilet, pain control. VDRF- Lurline Idol 12/24.  Dislodged and replaced multiple times since admission, finally decannulated 05/01/20.  Hemoperitoneum- s/p exlap with repair of SB mesenteric laceration x2 by Dr. Redmond Pulling, also noted mesenteric hematoma. Staples removed  Dislodged PEG tube- replaced in IR 12/28 ComminutedR femur fx - Ortho c/s, Dr. Doreatha Martin, s/p IMN 12/17 and ORIF. NWB RLE Open R tib/fib fxs- Ortho c/s, Dr. Doreatha Martin, s/p I&D and IMN. NWB RLE ABLA-hct stable FEN -TF ID-+ BC 12/27 (staph). On vanc since 12/28. Will discuss with MD duration.  DVT - SCDs,SQHOKby NSGY Foley - None Dispo - 4NP, PT/OT/SLP. CIR   LOS: 18 days    Jillyn Ledger , [facility] 05/05/2020, 10:05 AM Please see Amion for pager number during day hours 7:00am-4:30pm

## 2020-05-05 NOTE — Progress Notes (Signed)
SLP Cancellation Note  Patient Details Name: Adam Bates MRN: [id number] DOB: [birth year]   Cancelled treatment:        Attempted to see earlier- pt going to procedure. Currently sleeping - RN reported pt was agitated this earlier, received Versed. Will continue efforts.    Royce Macadamia 05/05/2020, 4:18 PM  [name] Lonell Face.Ed Nurse, children's [phone] Office [phone]

## 2020-05-05 NOTE — Progress Notes (Signed)
PT Cancellation Note  Patient Details Name: MALEIK VANDERZEE MRN: [id number] DOB: 08/01/1970   Cancelled Treatment:    Reason Eval/Treat Not Completed: Patient at procedure or test/unavailable - will check back as schedule allows.  Marye Round, PT Acute Rehabilitation Services Pager [phone]  Office [phone]    Truddie Coco 05/05/2020, 11:52 AM

## 2020-05-05 NOTE — Progress Notes (Signed)
Physical Therapy Treatment Patient Details Name: Adam Bates MRN: [id number] DOB: 1970-11-18 Today's Date: 05/05/2020    History of Present Illness Pt is a 49yo male s/p [facility] who sustained TBI, SAH, IPH with DAI. facial fx -no-op, L rib fx 5-11, 4-7 with PTX and bilat pulm contusions, s/pe xlap with repair of SB mesenteric lac x2 and, comminuted R femur fx and R tib/fib fracture - s/p ORIF. R LE NWB.  Pt intubated. Pt trached and pegged on 12/24, decannulated 12/30. 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    PT Comments    Pt very agitated and paranoid this session, not following commands at all and shouting to people not present in room. Pt tolerated very short period of time sitting EOB, not continued due to pt impulsivity with attempting to stand and zero command following. Pt requiring total assist for all aspects today, pt behavior consistent with RLA level 4. PT continuing to recommend CIR post-acutely.    Follow Up Recommendations  CIR     Equipment Recommendations  Other (comment) (defer to post acute)    Recommendations for Other Services       Precautions / Restrictions Precautions Precautions: Fall Precaution Comments: peg tube Restrictions Weight Bearing Restrictions: Yes RLE Weight Bearing: Non weight bearing    Mobility  Bed Mobility Overal bed mobility: Needs Assistance Bed Mobility: Supine to Sit;Sit to Supine     Supine to sit: Total assist;+2 for physical assistance Sit to supine: Total assist;+2 for physical assistance   General bed mobility comments: Total +2 for all aspects of to/from EOB, boost up in bed upon return to supine.  Transfers Overall transfer level: Needs assistance Equipment used: 1 person hand held assist Transfers: Sit to/from Stand Sit to Stand: Max assist         General transfer comment: Pt attempting to stand multiple times at EOB, unable to not WB through RLE so attempts terminated by  PT.  Ambulation/Gait             General Gait Details: unable at this time   Stairs             Wheelchair Mobility    Modified Rankin (Stroke Patients Only)       Balance Overall balance assessment: Needs assistance   Sitting balance-Leahy Scale: Fair Sitting balance - Comments: Pt tolerated sitting EOB only very briefly due to agitated state and impulsivity at EOB                                    Cognition Arousal/Alertness: Awake/alert Behavior During Therapy: Agitated;Impulsive Overall Cognitive Status: Impaired/Different from baseline Area of Impairment: Attention;Orientation;Following commands;Safety/judgement               Rancho Levels of Cognitive Functioning Rancho Los Amigos Scales of Cognitive Functioning: Confused/agitated Orientation Level: Disoriented to;Place;Time;Situation Current Attention Level: Focused   Following Commands: Follows one step commands inconsistently Safety/Judgement: Decreased awareness of safety;Decreased awareness of deficits Awareness: Intellectual Problem Solving: Slow processing;Decreased initiation;Difficulty sequencing;Requires verbal cues;Requires tactile cues General Comments: Pt restless, paranoid, using foul language at times, and making sexually inappropriate comments during session. Pt frequently calling out people's names unknown to this PT, stating "Ladene Artist!! Derrick!!", and "where the F--- is" and looks around the room as if looking for someone. Other speech throughout is incoherent, with pt looking around the room and at PT/PT aide  wildly.      Exercises General Exercises - Lower Extremity Heel Slides: PROM;Right;10 reps;Supine    General Comments General comments (skin integrity, edema, etc.): phlegm on pt gown with productive coughing heard prior to session      Pertinent Vitals/Pain Pain Assessment: Faces Faces Pain Scale: Hurts little more Pain Location: generalized Pain  Descriptors / Indicators: Grimacing;Discomfort;Guarding Pain Intervention(s): Limited activity within patient's tolerance;Monitored during session;Repositioned    Home Living                      Prior Function            PT Goals (current goals can now be found in the care plan section) Acute Rehab PT Goals Patient Stated Goal: unable to state PT Goal Formulation: With family Time For Goal Achievement: 05/11/20 Potential to Achieve Goals: Fair Progress towards PT goals: Progressing toward goals    Frequency    Min 4X/week      PT Plan Current plan remains appropriate    Co-evaluation              AM-PAC PT "6 Clicks" Mobility   Outcome Measure  Help needed turning from your back to your side while in a flat bed without using bedrails?: A Lot Help needed moving from lying on your back to sitting on the side of a flat bed without using bedrails?: Total Help needed moving to and from a bed to a chair (including a wheelchair)?: Total Help needed standing up from a chair using your arms (e.g., wheelchair or bedside chair)?: Total Help needed to walk in hospital room?: Total Help needed climbing 3-5 steps with a railing? : Total 6 Click Score: 7    End of Session   Activity Tolerance: Treatment limited secondary to agitation Patient left: with restraints reapplied;with family/visitor present;in bed;with bed alarm set (bilateral wrist restraints, belt restraint, mitts) Nurse Communication: Mobility status PT Visit Diagnosis: Muscle weakness (generalized) (M62.81);Difficulty in walking, not elsewhere classified (R26.2)     Time: [id number] PT Time Calculation (min) (ACUTE ONLY): 17 min  Charges:  $Therapeutic Activity: 8-22 mins                     Stacie Glaze, PT Acute Rehabilitation Services Pager [phone]  Office [phone]    Louis Matte 05/05/2020, 3:47 PM

## 2020-05-06 LAB — CREATININE, SERUM
Creatinine, Ser: 0.6 mg/dL — ABNORMAL LOW (ref 0.61–1.24)
GFR, Estimated: >60 mL/min

## 2020-05-06 LAB — GLUCOSE, CAPILLARY
Glucose-Capillary: 128 mg/dL — ABNORMAL HIGH (ref 70–99)
Glucose-Capillary: 108 mg/dL — ABNORMAL HIGH (ref 70–99)
Glucose-Capillary: 98 mg/dL (ref 70–99)
Glucose-Capillary: 104 mg/dL — ABNORMAL HIGH (ref 70–99)
Glucose-Capillary: 104 mg/dL — ABNORMAL HIGH (ref 70–99)

## 2020-05-06 MED ORDER — NYSTATIN 100000 UNIT/GM EX POWD
Freq: Three times a day (TID) | CUTANEOUS | Status: DC
  Administered 2020-05-07 – 2020-05-08 (×2): 1 via TOPICAL
  Filled 2020-05-06: qty 15

## 2020-05-06 NOTE — Progress Notes (Signed)
11 Days Post-Op  Subjective: CC: No acute changes overnight. Tolerating tf's. BM yesterday. Not orientated to self, time, place or situation this morning.   Objective: Vital signs in last 24 hours: Temp:  [97.9 F (36.6 C)-98.8 F (37.1 C)] 98.1 F (36.7 C) (01/04 0749) Pulse Rate:  [82-95] 94 (01/04 0749) Resp:  [13-27] 27 (01/04 0749) BP: (109-120)/(71-86) 120/85 (01/04 0749) SpO2:  [93 %-100 %] 93 % (01/04 0749) Weight:  [94.6 kg] 94.6 kg (01/04 0453) Last BM Date: 05/06/20  Intake/Output from previous day: 01/03 0701 - 01/04 0700 In: 1930 [NG/GT:1680; IV Piggyback:250] Out: 2100 [Urine:2100] Intake/Output this shift: No intake/output data recorded.  PE: Gen: no distress Neuro: alert HEENT: dressing in place over prior trach site CV: RRR Pulm: unlabored breathing, cta b/l. Abd: soft, NT,nondistended;PEG in place with TF's running. Midline wound with staples removed. There is a small opening at the base of the wound without signs of infection. Extr: wwp, no edema  Lab Results:  No results for input(s): WBC, HGB, HCT, PLT in the last 72 hours. BMET Recent Labs    05/04/20 1122 05/06/20 0444  CREATININE 0.73 0.60*   PT/INR No results for input(s): LABPROT, INR in the last 72 hours. CMP     Component Value Date/Time   NA 148 (H) 04/29/2020 1803   K 3.8 04/29/2020 1803   CL 114 (H) 04/29/2020 0019   CO2 23 04/29/2020 0019   GLUCOSE 155 (H) 04/29/2020 0019   BUN 35 (H) 04/29/2020 0019   CREATININE 0.60 (L) 05/06/2020 0444   CALCIUM 7.7 (L) 04/29/2020 0019   PROT 5.1 (L) 04/28/2020 0937   ALBUMIN 1.9 (L) 04/28/2020 0937   AST 36 04/28/2020 0937   ALT 54 (H) 04/28/2020 0937   ALKPHOS 109 04/28/2020 0937   BILITOT 1.2 04/28/2020 0937   GFRNONAA >60 05/06/2020 0444   Lipase  No results found for: LIPASE     Studies/Results: DG Tibia/Fibula Right  Result Date: 05/05/2020 CLINICAL DATA:  Follow-up tibial fracture EXAM: RIGHT TIBIA AND FIBULA - 2  VIEW COMPARISON:  04/18/2020 FINDINGS: Multiple fractures are again seen in the tibia and fibula. Medullary rod is again noted in satisfactory position within the tibia. The fibular fractures appears slightly improved in alignment. IMPRESSION: Tibial and fibular fractures again identified with fixation rod within the tibia. The fibular fracture fragments appear slightly improved in alignment. Electronically Signed   By: Inez Catalina M.D.   On: 05/05/2020 12:07   DG FEMUR, MIN 2 VIEWS RIGHT  Result Date: 05/05/2020 CLINICAL DATA:  Previous femur fixation EXAM: RIGHT FEMUR 2 VIEWS COMPARISON:  04/18/2020 FINDINGS: Medullary rod is noted within the right femur. Multiple proximal fixation screws are noted. Fracture fragments proximally are in near anatomic alignment. Mild displacement of the fracture fragments in the midshaft of the femur is seen. IMPRESSION: Persistent fixation of the right femur. Proximal fracture fragments are in near anatomic alignment. Some slight displacement of the dominant fragment in the midshaft of the femur is noted laterally but stable. Electronically Signed   By: Inez Catalina M.D.   On: 05/05/2020 12:03    Anti-infectives: Anti-infectives (From admission, onward)   Start     Dose/Rate Route Frequency Ordered Stop   05/02/20 2000  vancomycin (VANCOREADY) IVPB 1250 mg/250 mL        1,250 mg 166.7 mL/hr over 90 Minutes Intravenous Every 8 hours 05/02/20 1327 05/06/20 2359   04/30/20 1200  vancomycin (VANCOREADY) IVPB 1500 mg/300 mL  Status:  Discontinued        1,500 mg 150 mL/hr over 120 Minutes Intravenous Every 12 hours 04/29/20 2229 05/02/20 1327   04/29/20 2315  vancomycin (VANCOREADY) IVPB 2000 mg/400 mL        2,000 mg 200 mL/hr over 120 Minutes Intravenous NOW 04/29/20 2229 04/30/20 0150   04/28/20 1715  ceFEPIme (MAXIPIME) 2 g in sodium chloride 0.9 % 100 mL IVPB  Status:  Discontinued        2 g 200 mL/hr over 30 Minutes Intravenous Every 8 hours 04/28/20 1617  05/01/20 1452   04/18/20 2300  clindamycin (CLEOCIN) IVPB 900 mg       "And" Linked Group Details   900 mg 100 mL/hr over 30 Minutes Intravenous Every 8 hours 04/18/20 1829 04/21/20 1511   04/18/20 1930  aztreonam (AZACTAM) 2 g in sodium chloride 0.9 % 100 mL IVPB       "And" Linked Group Details   2 g 200 mL/hr over 30 Minutes Intravenous Every 8 hours 04/18/20 1829 04/21/20 1226   04/18/20 1620  vancomycin (VANCOCIN) powder  Status:  Discontinued          As needed 04/18/20 1620 04/18/20 1812   04/18/20 0600  levofloxacin (LEVAQUIN) IVPB 750 mg  Status:  Discontinued        750 mg 100 mL/hr over 90 Minutes Intravenous Every 24 hours 04/18/20 0204 04/19/20 1052   04/18/20 0100  cefTRIAXone (ROCEPHIN) 2 g in sodium chloride 0.9 % 100 mL IVPB  Status:  Discontinued        2 g 200 mL/hr over 30 Minutes Intravenous Every 24 hours 04/18/20 0046 04/18/20 0202   04/18/20 0030  ceFAZolin (ANCEF) IVPB 2g/100 mL premix  Status:  Discontinued        2 g 200 mL/hr over 30 Minutes Intravenous Every 8 hours 04/18/20 0024 04/18/20 0046   04/17/20 2230  ceFAZolin (ANCEF) IVPB 1 g/50 mL premix  Status:  Discontinued        1 g 100 mL/hr over 30 Minutes Intravenous  Once 04/17/20 2219 04/17/20 2230   04/17/20 2110  ceFAZolin (ANCEF) IVPB 1 g/50 mL premix        over 30 Minutes  Continuous PRN 04/17/20 2117 04/17/20 2110       Assessment/Plan [facility] TBISAH,IPH- NSGY c/s, Dr. Jake Samples, stable head CT, hold keppra in light of intra-op rash of unclear etiology after admin, MRIg2 DAI, expecting if recovery occurs that it will be weeks to months. Facial fxs- ENT c/s, Dr. Pollyann Kennedy, non-op management Multiple L rib fxs (5-11, 4-7) with PTX, b/l pulm contusions- IS, pulm toilet, pain control. VDRF- Janina Mayo 12/24. Dislodged and replaced multiple times since admission, finally decannulated 05/01/20.  Hemoperitoneum- s/p exlap with repair of SB mesenteric laceration x2 by Dr. Andrey Campanile, also noted mesenteric  hematoma. Staples removed  Dislodged PEG tube- replaced in IR 12/28 ComminutedR femur fx - Ortho c/s, Dr. Jena Gauss, s/p IMN 12/17 and ORIF. NWB RLE Open R tib/fib fxs- Ortho c/s, Dr. Jena Gauss, s/p I&D and IMN. NWB RLE ABLA-hct stable FEN -TF ID-+ BC 12/27 (staph). Vanc 12/28 - 1/4 DVT - SCDs,SQHHeparin Foley - None. Voiding.  Dispo - 4NP, PT/OT/SLP. CIR   LOS: 19 days    Jacinto Halim , [facility] Surgery 05/06/2020, 9:04 AM Please see Amion for pager number during day hours 7:00am-4:30pm

## 2020-05-06 NOTE — Consult Note (Signed)
WOC Nurse Consult Note: Consulted for assessment of moisture associated skin damage to groin and abdomen.  Patient is frequently incontinent and moist.  Patient had medication reaction and this resulted in a rash but the nurse indicates this is different and has gotten worse.   Reason for Consult:MASD to inguinal folds and beneath abdominal pannus.  Wound type:MASD Pressure Injury POA: NA Measurement: generalized erythema and tenderness Wound UJW:JXBJ Drainage (amount, consistency, odor)none Periwound: moist  Dressing procedure/placement/frequency:Begin interdry Ag to abdominal fold and inguinal folds. .  If we are changing more than every two days due to moisture exposure, we will discontinue and reconsult WOC team.  Measure and cut length of InterDry to fit in skin folds that have skin breakdown Tuck InterDry fabric into skin folds in a single layer, allow for 2 inches of overhang from skin edges to allow for wicking to occur May remove to bathe; dry area thoroughly and then tuck into affected areas again Do not apply any creams or ointments when using InterDry DO NOT THROW AWAY FOR 5 DAYS unless soiled with stool DO NOT [facility] product, this will inactivate the silver in the material  New sheet of Interdry should be applied after 5 days of use if patient continues to have skin breakdown   Will not follow at this time.  Please re-consult if needed.  Maple Hudson MSN, RN, FNP-BC CWON Wound, Ostomy, Continence Nurse Pager [phone]

## 2020-05-06 NOTE — Progress Notes (Signed)
Inpatient Rehab Admissions Coordinator:   Met with pt. To let him know that we are working on insurance approval for CIR stay. I do not have insurance auth or a bed available for Pt. Today.   Clemens Catholic, [place], [place] Admissions Coordinator  [phone] ([place]) [phone] (office)

## 2020-05-06 NOTE — Progress Notes (Signed)
SLP Cancellation Note  Patient Details Name: Adam Bates MRN: [id number] DOB: 1970/08/18   Cancelled treatment:        In to see pt for cognition, communication and swallow - RN assisting to sit upright and pt had large BM. Will reattempt as schedule permits.   Royce Macadamia 05/06/2020, 11:45 AM   [name] Lonell Face.Ed Nurse, children's [phone] Office [phone]

## 2020-05-06 NOTE — Progress Notes (Signed)
Physical Therapy Treatment Patient Details Name: Adam Bates MRN: [id number] DOB: [birth year] Today's Date: 05/06/2020    History of Present Illness Pt is a 50yo male s/p [facility] who sustained TBI, SAH, IPH with DAI. facial fx -no-op, L rib fx 5-11, 4-7 with PTX and bilat pulm contusions, s/pe xlap with repair of SB mesenteric lac x2 and, comminuted R femur fx and R tib/fib fracture - s/p ORIF. R LE NWB.  Pt intubated. Pt trached and pegged on 12/24, decannulated 12/30. 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    PT Comments    PT and OT saw pt together to maximize therapeutic input and for pt safety. Pt requiring significant assist for bed mobility, and successfully stood with max +2 for multiple attempts. Pt with good adherence to NWB RLE with PT and OT multimodal cuing. Pt continuing to present at [facility] level IV, with biting, restlessness, poor command following, sexual inappropriateness, and hard to follow statements throughout session. Continuing to recommend CIR to maximize functional recovery.    Follow Up Recommendations  CIR     Equipment Recommendations  Other (comment) (defer to post acute)    Recommendations for Other Services       Precautions / Restrictions Precautions Precautions: Fall Precaution Comments: peg tube/ abdominal binder Required Braces or Orthoses: Other Brace Other Brace: pravalon boot requested due to wound on R heel Restrictions Weight Bearing Restrictions: Yes RLE Weight Bearing: Non weight bearing    Mobility  Bed Mobility Overal bed mobility: Needs Assistance Bed Mobility: Supine to Sit;Sit to Supine Rolling: Mod assist;+2 for physical assistance;+2 for safety/equipment   Supine to sit: Mod assist;HOB elevated (elevated hOB fully to help with progress to EOB) Sit to supine: Total assist;+2 for physical assistance   General bed mobility comments: Pt requires (A) to bring bil LE off bed and back onto bed surface.  pt needs max cues to sequence task. pt tolerating rolling multiple times for peri care. pt with wound at buttock and RN made aware  Transfers Overall transfer level: Needs assistance Equipment used: 2 person hand held assist;Rolling walker (2 wheeled) Transfers: Sit to/from Stand Sit to Stand: +2 physical assistance;Max assist         General transfer comment: RW placed slightly in front of patient to allow therapist to place R LE on therapist foot to ensure NWB . pt appropriately reaching for RW and static standing with facilitation by therapist for upright posture. pt maintains NWB with max cues  Ambulation/Gait                 Stairs             Wheelchair Mobility    Modified Rankin (Stroke Patients Only)       Balance Overall balance assessment: Needs assistance Sitting-balance support: Single extremity supported;Feet supported Sitting balance-Leahy Scale: Fair   Postural control: Posterior lean   Standing balance-Leahy Scale: Poor Standing balance comment: reliant on external support of OT/PT                            Cognition Arousal/Alertness: Awake/alert Behavior During Therapy: Impulsive;Restless Overall Cognitive Status: Impaired/Different from baseline                 Rancho Levels of Cognitive Functioning [place]igos Scales of Cognitive Functioning: Confused/agitated Orientation Level: Disoriented to;Place;Time;Situation Current Attention Level: Focused   Following Commands: Follows one step  commands inconsistently Safety/Judgement: Decreased awareness of safety;Decreased awareness of deficits Awareness: Intellectual Problem Solving: Slow processing;Decreased initiation;Difficulty sequencing;Requires verbal cues;Requires tactile cues General Comments: pt able to report name with greeting appropriately. pt able to recall 4 children when asked how many children he has. pt tangential at times during session. pt singing  to "smells like teen spirit" by nirvana. pt does try to bite therapist on the right side and stops when told to not bite. pt apologizes but gives no reason for why he tried to bite. pt with wounds on buttock and reaching for buttock in response to pain appropriately. pt making inappropriate responses to therapists at times during session. Multiple sexual comments made      Exercises      General Comments General comments (skin integrity, edema, etc.): Buttocks, base of shaft of penis, and inner thigh wounds suspect due to fecal and urinary incontinence. Additional wound on R heel, RN applied heel pad to bilateral heels for protection. PA informed of need for prevalon boots, air bed, wound care      Pertinent Vitals/Pain Pain Assessment: Faces Faces Pain Scale: Hurts even more Pain Location: buttocks (sore from stool incontinence, redness), RLE in sitting Pain Descriptors / Indicators: Grimacing;Discomfort;Guarding;Moaning Pain Intervention(s): Limited activity within patient's tolerance;Monitored during session;Repositioned    Home Living                      Prior Function            PT Goals (current goals can now be found in the care plan section) Acute Rehab PT Goals Patient Stated Goal: unable to state PT Goal Formulation: With family Time For Goal Achievement: 05/11/20 Potential to Achieve Goals: Fair Progress towards PT goals: Progressing toward goals    Frequency    Min 4X/week      PT Plan Current plan remains appropriate    Co-evaluation PT/OT/SLP Co-Evaluation/Treatment: Yes Reason for Co-Treatment: For patient/therapist safety;Complexity of the patient's impairments (multi-system involvement);Necessary to address cognition/behavior during functional activity;To address functional/ADL transfers PT goals addressed during session: Mobility/safety with mobility;Balance OT goals addressed during session: ADL's and self-care;Proper use of Adaptive  equipment and DME;Strengthening/ROM      AM-PAC PT "6 Clicks" Mobility   Outcome Measure  Help needed turning from your back to your side while in a flat bed without using bedrails?: A Lot Help needed moving from lying on your back to sitting on the side of a flat bed without using bedrails?: Total Help needed moving to and from a bed to a chair (including a wheelchair)?: Total Help needed standing up from a chair using your arms (e.g., wheelchair or bedside chair)?: A Lot Help needed to walk in hospital room?: Total Help needed climbing 3-5 steps with a railing? : Total 6 Click Score: 8    End of Session Equipment Utilized During Treatment: Gait belt Activity Tolerance: Patient limited by fatigue;Other (comment) (restlessness) Patient left: with restraints reapplied;in bed;with bed alarm set;with nursing/sitter in room (bilateral wrist restraints, belt restraint, mitts) Nurse Communication: Mobility status PT Visit Diagnosis: Muscle weakness (generalized) (M62.81);Difficulty in walking, not elsewhere classified (R26.2)     Time: 5956-3875 PT Time Calculation (min) (ACUTE ONLY): 45 min  Charges:  $Therapeutic Activity: 8-22 mins                     Marye Round, PT Acute Rehabilitation Services Pager [phone]  Office [phone]   Tyrone Apple E Christain [place] 05/06/2020, 12:11 PM

## 2020-05-06 NOTE — Progress Notes (Signed)
Occupational Therapy Treatment Patient Details Name: Adam Bates MRN: [id number] DOB: [birth year] Today's Date: 05/06/2020    History of present illness Pt is a 50yo male s/p [facility] who sustained TBI, SAH, IPH with DAI. facial fx -no-op, L rib fx 5-11, 4-7 with PTX and bilat pulm contusions, s/pe xlap with repair of SB mesenteric lac x2 and, comminuted R femur fx and R tib/fib fracture - s/p ORIF. R LE NWB.  Pt intubated. Pt trached and pegged on 12/24, decannulated 12/30. 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   OT comments  Pt currently demonstrates [place] recovery level IV (biting, pulling at lines, inappropriate responses to staff). Pt tolerates EOB >20 minutes. Pt with incontinence and immediately calling out in pain due to skin breakdown present. Pt with heavy barrier cream applied to dry skin. RN made aware of break down on R heel area and buttock. Requesting airmattress to help with skin breakdown and pravalon boot R foot to float heel. Recommend dressing on bil LE heels to help with pressure relief in the mean time. Recommendation CIR   Follow Up Recommendations  CIR    Equipment Recommendations  Wheelchair (measurements OT);Wheelchair cushion (measurements OT);3 in 1 bedside commode;Other (comment) (RW)    Recommendations for Other Services Rehab consult;Other (comment) (wound care)    Precautions / Restrictions Precautions Precautions: Fall Precaution Comments: peg tube/ abdominal binder Required Braces or Orthoses: Other Brace Other Brace: pravalon boot requested due to wound on R foot heel Restrictions Weight Bearing Restrictions: Yes RLE Weight Bearing: Non weight bearing       Mobility Bed Mobility Overal bed mobility: Needs Assistance Bed Mobility: Supine to Sit;Sit to Supine Rolling: Mod assist;+2 for physical assistance;+2 for safety/equipment   Supine to sit: Mod assist;HOB elevated (elevated hOB fully to help with progress to  EOB) Sit to supine: Total assist;+2 for physical assistance   General bed mobility comments: Pt requires (A) to bring bil LE off bed and back onto bed surface. pt needs max cues to sequence task. pt tolerating rolling multiple times for peri care. pt with wound at buttock and RN made aware  Transfers Overall transfer level: Needs assistance Equipment used: 2 person hand held assist;Rolling walker (2 wheeled) Transfers: Sit to/from Stand Sit to Stand: +2 physical assistance;Max assist         General transfer comment: RW placed slightly in front of patient to allow therapist to place R LE on therapist foot to ensure NWB . pt appropriately reaching for RW and static standing with facilitation by therapist for upright posture. pt maintains NWB    Balance Overall balance assessment: Needs assistance Sitting-balance support: Single extremity supported;Feet supported Sitting balance-Leahy Scale: Fair   Postural control: Posterior lean   Standing balance-Leahy Scale: Poor Standing balance comment: reliant on external support of OT/PT                           ADL either performed or assessed with clinical judgement   ADL Overall ADL's : Needs assistance/impaired                 Upper Body Dressing : Maximal assistance Upper Body Dressing Details (indicate cue type and reason): don new gown Lower Body Dressing: Total assistance Lower Body Dressing Details (indicate cue type and reason): pt was unable to verbalize sock or where a sock is don.  General ADL Comments: pt completed sit<>Stand x4 during session with R LE crossing over LLE without attempt to weight bear on it but rather just letting it hang. pt with good response to facilitation at trunk for upright posture in standing     Vision       Perception     Praxis      Cognition Arousal/Alertness: Awake/alert Behavior During Therapy: Impulsive;Restless Overall Cognitive Status:  Impaired/Different from baseline                 Rancho Levels of Cognitive Functioning Ranch[place] Los Amigos Scales of Cognitive Functioning: Confused/agitated Orientation Level: Disoriented to;Place;Time;Situation Current Attention Level: Focused   Following Commands: Follows one step commands inconsistently Safety/Judgement: Decreased awareness of safety;Decreased awareness of deficits Awareness: Intellectual Problem Solving: Slow processing;Decreased initiation;Difficulty sequencing;Requires verbal cues;Requires tactile cues General Comments: pt able to report name with greeting appropriately. pt able to recall 4 children when asked how many children he has. pt tangential at times during session. pt singing to "smells like teen spirit nirvana"  pt does try to bite therapist on the right side and stops when told to not bite. pt apologizes but gives no reason for why he tried to bite. pt with wounds on buttock and reaching for buttock in response to pain appropriately. pt making inappropriate responses to thearpists at times during session        Exercises     Shoulder Instructions       General Comments noted to have wound on buttock, wound on base of penis (advise not to use condom cath due to wound), and break down peri area from urine. Therapy requesting  pravalon boots, airmattrees overlay and wound care.    Pertinent Vitals/ Pain       Pain Assessment: Faces Faces Pain Scale: Hurts even more Pain Location: generalized Pain Descriptors / Indicators: Grimacing;Discomfort;Guarding Pain Intervention(s): Monitored during session;Repositioned;Premedicated before session  Home Living                                          Prior Functioning/Environment              Frequency  Min 2X/week        Progress Toward Goals  OT Goals(current goals can now be found in the care plan section)  Progress towards OT goals: Progressing toward goals  Acute  Rehab OT Goals Patient Stated Goal: unable to state OT Goal Formulation: Patient unable to participate in goal setting Time For Goal Achievement: 05/12/20 Potential to Achieve Goals: Good ADL Goals Pt Will Perform Grooming: with mod assist;sitting Pt/caregiver will Perform Home Exercise Program: Increased strength;Increased ROM;Both right and left upper extremity;With minimal assist;With written HEP provided Additional ADL Goal #1: Pt will follow 1 step commands with >75% accuracy during ADL/functional task. Additional ADL Goal #2: Pt will maintain eyes open >5 min during functional task. Additional ADL Goal #3: Pt will maintain sitting balance EOB >10 with min (A)  Plan Discharge plan remains appropriate    Co-evaluation    PT/OT/SLP Co-Evaluation/Treatment: Yes Reason for Co-Treatment: Complexity of the patient's impairments (multi-system involvement);Necessary to address cognition/behavior during functional activity;For patient/therapist safety   OT goals addressed during session: ADL's and self-care;Proper use of Adaptive equipment and DME;Strengthening/ROM      AM-PAC OT "6 Clicks" Daily Activity     Outcome Measure   Help from another person  eating meals?: Total Help from another person taking care of personal grooming?: Total Help from another person toileting, which includes using toliet, bedpan, or urinal?: A Lot Help from another person bathing (including washing, rinsing, drying)?: Total Help from another person to put on and taking off regular upper body clothing?: A Lot Help from another person to put on and taking off regular lower body clothing?: Total 6 Click Score: 8    End of Session Equipment Utilized During Treatment: Gait belt;Rolling walker  OT Visit Diagnosis: Muscle weakness (generalized) (M62.81);Other abnormalities of gait and mobility (R26.89);Other symptoms and signs involving cognitive function;Cognitive communication deficit (R41.841)   Activity  Tolerance Patient tolerated treatment well   Patient Left in bed;with call bell/phone within reach;with restraints reapplied;Other (comment) (abdominal restraint, mittens wrist restraints)   Nurse Communication Mobility status;Precautions;Weight bearing status        Time: [phone] OT Time Calculation (min): 45 min  Charges: OT General Charges $OT Visit: 1 Visit OT Treatments $Self Care/Home Management : 8-22 mins $Therapeutic Activity: 8-22 mins   Brynn, OTR/L  Acute Rehabilitation Services Pager: [phone] Office: [phone] .    Mateo Flow 05/06/2020, 10:59 AM

## 2020-05-07 ENCOUNTER — Encounter (HOSPITAL_COMMUNITY): Payer: Self-pay | Admitting: Surgery

## 2020-05-07 LAB — CULTURE, BLOOD (ROUTINE X 2)
Culture: NO GROWTH
Culture: NO GROWTH
Special Requests: ADEQUATE
Special Requests: ADEQUATE

## 2020-05-07 LAB — GLUCOSE, CAPILLARY
Glucose-Capillary: 108 mg/dL — ABNORMAL HIGH (ref 70–99)
Glucose-Capillary: 112 mg/dL — ABNORMAL HIGH (ref 70–99)
Glucose-Capillary: 114 mg/dL — ABNORMAL HIGH (ref 70–99)
Glucose-Capillary: 115 mg/dL — ABNORMAL HIGH (ref 70–99)
Glucose-Capillary: 132 mg/dL — ABNORMAL HIGH (ref 70–99)
Glucose-Capillary: 90 mg/dL (ref 70–99)
Glucose-Capillary: 98 mg/dL (ref 70–99)

## 2020-05-07 MED ORDER — PROSOURCE TF PO LIQD
90.0000 mL | Freq: Two times a day (BID) | ORAL | Status: DC
  Administered 2020-05-07 – 2020-05-09 (×4): 90 mL
  Filled 2020-05-07 (×4): qty 90

## 2020-05-07 MED ORDER — MORPHINE SULFATE (PF) 2 MG/ML IV SOLN
2.0000 mg | Freq: Four times a day (QID) | INTRAVENOUS | Status: DC | PRN
  Administered 2020-05-08 – 2020-05-09 (×2): 2 mg via INTRAVENOUS
  Filled 2020-05-07 (×3): qty 1

## 2020-05-07 MED ORDER — OSMOLITE 1.5 CAL PO LIQD: 1000.0000 mL | ORAL | Status: DC

## 2020-05-07 NOTE — Addendum Note (Signed)
Addendum  created 05/07/20 2314 by Atilano Median, DO   Intraprocedure Event edited, Intraprocedure Staff edited

## 2020-05-07 NOTE — Progress Notes (Addendum)
Patient ID: Adam Bates, male   DOB: 1970-09-03, 49 y.o.   MRN: [id number] 12 Days Post-Op   Subjective: Denies complaint  ROS negative except as listed above. Objective: Vital signs in last 24 hours: Temp:  [97.8 F (36.6 C)-98.6 F (37 C)] 97.8 F (36.6 C) (01/05 0749) Pulse Rate:  [82-100] 89 (01/05 0300) Resp:  [17-20] 20 (01/05 0749) BP: (107-132)/(73-88) 132/88 (01/05 0749) SpO2:  [95 %-100 %] 95 % (01/05 0749) Weight:  [92.3 kg] 92.3 kg (01/05 0500) Last BM Date: 05/07/20  Intake/Output from previous day: 01/04 0701 - 01/05 0700 In: -  Out: 450 [Urine:450] Intake/Output this shift: No intake/output data recorded.  General appearance: cooperative Neck: trach site dressed Resp: clear to auscultation bilaterally Cardio: regular rate and rhythm GI: soft, PEG OK Extremities: R knee sutures Neurologic: Mental status: F/C, does not know the year Sutures out above Lab Results: CBC  No results for input(s): WBC, HGB, HCT, PLT in the last 72 hours. BMET Recent Labs    05/04/20 1122 05/06/20 0444  CREATININE 0.73 0.60*   PT/INR No results for input(s): LABPROT, INR in the last 72 hours. ABG No results for input(s): PHART, HCO3 in the last 72 hours.  Invalid input(s): PCO2, PO2  Studies/Results: DG Tibia/Fibula Right  Result Date: 05/05/2020 CLINICAL DATA:  Follow-up tibial fracture EXAM: RIGHT TIBIA AND FIBULA - 2 VIEW COMPARISON:  04/18/2020 FINDINGS: Multiple fractures are again seen in the tibia and fibula. Medullary rod is again noted in satisfactory position within the tibia. The fibular fractures appears slightly improved in alignment. IMPRESSION: Tibial and fibular fractures again identified with fixation rod within the tibia. The fibular fracture fragments appear slightly improved in alignment. Electronically Signed   By: Inez Catalina M.D.   On: 05/05/2020 12:07   DG FEMUR, MIN 2 VIEWS RIGHT  Result Date: 05/05/2020 CLINICAL DATA:  Previous femur  fixation EXAM: RIGHT FEMUR 2 VIEWS COMPARISON:  04/18/2020 FINDINGS: Medullary rod is noted within the right femur. Multiple proximal fixation screws are noted. Fracture fragments proximally are in near anatomic alignment. Mild displacement of the fracture fragments in the midshaft of the femur is seen. IMPRESSION: Persistent fixation of the right femur. Proximal fracture fragments are in near anatomic alignment. Some slight displacement of the dominant fragment in the midshaft of the femur is noted laterally but stable. Electronically Signed   By: Inez Catalina M.D.   On: 05/05/2020 12:03    Anti-infectives: Anti-infectives (From admission, onward)   Start     Dose/Rate Route Frequency Ordered Stop   05/02/20 2000  vancomycin (VANCOREADY) IVPB 1250 mg/250 mL        1,250 mg 166.7 mL/hr over 90 Minutes Intravenous Every 8 hours 05/02/20 1327 05/06/20 2152   04/30/20 1200  vancomycin (VANCOREADY) IVPB 1500 mg/300 mL  Status:  Discontinued        1,500 mg 150 mL/hr over 120 Minutes Intravenous Every 12 hours 04/29/20 2229 05/02/20 1327   04/29/20 2315  vancomycin (VANCOREADY) IVPB 2000 mg/400 mL        2,000 mg 200 mL/hr over 120 Minutes Intravenous NOW 04/29/20 2229 04/30/20 0150   04/28/20 1715  ceFEPIme (MAXIPIME) 2 g in sodium chloride 0.9 % 100 mL IVPB  Status:  Discontinued        2 g 200 mL/hr over 30 Minutes Intravenous Every 8 hours 04/28/20 1617 05/01/20 1452   04/18/20 2300  clindamycin (CLEOCIN) IVPB 900 mg       "And" Linked  Group Details   900 mg 100 mL/hr over 30 Minutes Intravenous Every 8 hours 04/18/20 1829 04/21/20 1511   04/18/20 1930  aztreonam (AZACTAM) 2 g in sodium chloride 0.9 % 100 mL IVPB       "And" Linked Group Details   2 g 200 mL/hr over 30 Minutes Intravenous Every 8 hours 04/18/20 1829 04/21/20 1226   04/18/20 1620  vancomycin (VANCOCIN) powder  Status:  Discontinued          As needed 04/18/20 1620 04/18/20 1812   04/18/20 0600  levofloxacin (LEVAQUIN) IVPB  750 mg  Status:  Discontinued        750 mg 100 mL/hr over 90 Minutes Intravenous Every 24 hours 04/18/20 0204 04/19/20 1052   04/18/20 0100  cefTRIAXone (ROCEPHIN) 2 g in sodium chloride 0.9 % 100 mL IVPB  Status:  Discontinued        2 g 200 mL/hr over 30 Minutes Intravenous Every 24 hours 04/18/20 0046 04/18/20 0202   04/18/20 0030  ceFAZolin (ANCEF) IVPB 2g/100 mL premix  Status:  Discontinued        2 g 200 mL/hr over 30 Minutes Intravenous Every 8 hours 04/18/20 0024 04/18/20 0046   04/17/20 2230  ceFAZolin (ANCEF) IVPB 1 g/50 mL premix  Status:  Discontinued        1 g 100 mL/hr over 30 Minutes Intravenous  Once 04/17/20 2219 04/17/20 2230   04/17/20 2110  ceFAZolin (ANCEF) IVPB 1 g/50 mL premix        over 30 Minutes  Continuous PRN 04/17/20 2117 04/17/20 2110      Assessment/Plan: [facility] TBISAH,IPH- NSGY c/s, Dr. Jake Samples, stable head CT, hold keppra in light of intra-op rash of unclear etiology after admin, MRI DAI, expecting if recovery occurs that it will be weeks to months. Facial fxs- ENT c/s, Dr. Pollyann Kennedy, non-op management Multiple L rib fxs (5-11, 4-7) with PTX, b/l pulm contusions- IS, pulm toilet, pain control. VDRF- Janina Mayo 12/24. Dislodged and replaced multiple times since admission, finally decannulated 05/01/20.  Hemoperitoneum- s/p exlap with repair of SB mesenteric laceration x2 by Dr. Andrey Campanile, also noted mesenteric hematoma. Staples removed  Dislodged PEG tube- replaced in IR 12/28 ComminutedR femur fx - Ortho c/s, Dr. Jena Gauss, s/p IMN 12/17 and ORIF. NWB RLE Open R tib/fib fxs- Ortho c/s, Dr. Jena Gauss, s/p I&D and IMN. NWB RLE ABLA FEN -TF ID-+ BC 12/27 (staph). Vanc 12/28 - 1/4 DVT - SCDs,SQHHeparin Foley - None. Voiding.  Dispo - 4NP, PT/OT/SLP. Await insurance approval for CIR. He keeps pulling off condom cath and has moisture associated groin, buttock, and abdomen wounds. WOC following. Will place foley temporarily.  LOS: 20 days    Violeta Gelinas, MD, MPH, FACS Trauma & General Surgery Use [email or web address] to contact on call provider  05/07/2020
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
## 2020-05-07 NOTE — Progress Notes (Signed)
Inpatient Rehabilitation Admissions Coordinator  I am opening case with [employer] for possible Cir admit. I await their determination.  Ottie Glazier, RN, MSN Rehab Admissions Coordinator [phone] 05/07/2020 5:02 PM

## 2020-05-07 NOTE — Progress Notes (Signed)
Inpatient Rehab Admissions Coordinator:   I do not have insurance auth or a CIR bed available for Pt. Today. I have updated Pt.'s wife and will continue to pursue for potential admit, pending bed availability and insurance auth.   Megan Salon, MS, CCC-SLP Rehab Admissions Coordinator  [phone] (celll) [phone] (office)
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
## 2020-05-07 NOTE — Progress Notes (Signed)
Staff heard pt say owe from another pt room. This nurse and charge nurse went into room and found pt on the floor on the other side of the bed. Left arm still in restraint. Staff was able to get pt back in the bed and assessed pt. No new injuries visible. Pt initially co pain in left arm and left leg. No reddened areas, bruises or abrasions. Pt reported after getting back in the bed that he did not have any pain. On call md notified. Wife was called and she came to see pt. Mayford Knife RN
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
## 2020-05-08 LAB — GLUCOSE, CAPILLARY
Glucose-Capillary: 112 mg/dL — ABNORMAL HIGH (ref 70–99)
Glucose-Capillary: 108 mg/dL — ABNORMAL HIGH (ref 70–99)
Glucose-Capillary: 116 mg/dL — ABNORMAL HIGH (ref 70–99)
Glucose-Capillary: 96 mg/dL (ref 70–99)
Glucose-Capillary: 105 mg/dL — ABNORMAL HIGH (ref 70–99)

## 2020-05-08 MED ORDER — POLYETHYLENE GLYCOL 3350 17 G PO PACK: 17.0000 g | PACK | Freq: Every day | ORAL | Status: DC | PRN

## 2020-05-08 NOTE — Progress Notes (Incomplete)
Pt was taken

## 2020-05-08 NOTE — Progress Notes (Signed)
13 Days Post-Op   Subjective: Denies any pain after fall last night. Singing. ROS negative except as listed above. Objective: Vital signs in last 24 hours: Temp:  [97.9 F (36.6 C)-98.5 F (36.9 C)] 98.1 F (36.7 C) (01/06 0805) Pulse Rate:  [91-97] 91 (01/06 0805) Resp:  [18-25] 19 (01/06 0805) BP: (113-121)/(79-87) 113/87 (01/06 0805) SpO2:  [96 %-100 %] 100 % (01/06 0805) Weight:  [92.3 kg] 92.3 kg (01/06 0457) Last BM Date: 05/07/20  Intake/Output from previous day: 01/05 0701 - 01/06 0700 In: -  Out: 1350 [Urine:1350] Intake/Output this shift: No intake/output data recorded.  General appearance: cooperative Resp: clear to auscultation bilaterally Cardio: regular rate and rhythm GI: soft, PEG site OK Extremities: ortho incisions CDI, legs NT Neurologic: Mental status: conversant, F/C, inappropriate at times  Lab Results: CBC  No results for input(s): WBC, HGB, HCT, PLT in the last 72 hours. BMET Recent Labs    05/06/20 0444  CREATININE 0.60*   PT/INR No results for input(s): LABPROT, INR in the last 72 hours. ABG No results for input(s): PHART, HCO3 in the last 72 hours.  Invalid input(s): PCO2, PO2  Studies/Results: No results found.  Anti-infectives: Anti-infectives (From admission, onward)   Start     Dose/Rate Route Frequency Ordered Stop   05/02/20 2000  vancomycin (VANCOREADY) IVPB 1250 mg/250 mL        1,250 mg 166.7 mL/hr over 90 Minutes Intravenous Every 8 hours 05/02/20 1327 05/06/20 2152   04/30/20 1200  vancomycin (VANCOREADY) IVPB 1500 mg/300 mL  Status:  Discontinued        1,500 mg 150 mL/hr over 120 Minutes Intravenous Every 12 hours 04/29/20 2229 05/02/20 1327   04/29/20 2315  vancomycin (VANCOREADY) IVPB 2000 mg/400 mL        2,000 mg 200 mL/hr over 120 Minutes Intravenous NOW 04/29/20 2229 04/30/20 0150   04/28/20 1715  ceFEPIme (MAXIPIME) 2 g in sodium chloride 0.9 % 100 mL IVPB  Status:  Discontinued        2 g 200 mL/hr over  30 Minutes Intravenous Every 8 hours 04/28/20 1617 05/01/20 1452   04/18/20 2300  clindamycin (CLEOCIN) IVPB 900 mg       "And" Linked Group Details   900 mg 100 mL/hr over 30 Minutes Intravenous Every 8 hours 04/18/20 1829 04/21/20 1511   04/18/20 1930  aztreonam (AZACTAM) 2 g in sodium chloride 0.9 % 100 mL IVPB       "And" Linked Group Details   2 g 200 mL/hr over 30 Minutes Intravenous Every 8 hours 04/18/20 1829 04/21/20 1226   04/18/20 1620  vancomycin (VANCOCIN) powder  Status:  Discontinued          As needed 04/18/20 1620 04/18/20 1812   04/18/20 0600  levofloxacin (LEVAQUIN) IVPB 750 mg  Status:  Discontinued        750 mg 100 mL/hr over 90 Minutes Intravenous Every 24 hours 04/18/20 0204 04/19/20 1052   04/18/20 0100  cefTRIAXone (ROCEPHIN) 2 g in sodium chloride 0.9 % 100 mL IVPB  Status:  Discontinued        2 g 200 mL/hr over 30 Minutes Intravenous Every 24 hours 04/18/20 0046 04/18/20 0202   04/18/20 0030  ceFAZolin (ANCEF) IVPB 2g/100 mL premix  Status:  Discontinued        2 g 200 mL/hr over 30 Minutes Intravenous Every 8 hours 04/18/20 0024 04/18/20 0046   04/17/20 2230  ceFAZolin (ANCEF) IVPB 1 g/50 mL  premix  Status:  Discontinued        1 g 100 mL/hr over 30 Minutes Intravenous  Once 04/17/20 2219 04/17/20 2230   04/17/20 2110  ceFAZolin (ANCEF) IVPB 1 g/50 mL premix        over 30 Minutes  Continuous PRN 04/17/20 2117 04/17/20 2110      Assessment/Plan: [facility] TBISAH,IPH- NSGY c/s, Dr. Reatha Armour, stable head CT, hold keppra in light of intra-op rash of unclear etiology after admin, MRI DAI, expecting if recovery occurs that it will be weeks to months. Facial fxs- ENT c/s, Dr. Constance Holster, non-op management Multiple L rib fxs (5-11, 4-7) with PTX, b/l pulm contusions- IS, pulm toilet, pain control. VDRF- Lurline Idol 12/24. Dislodged and replaced multiple times since admission, finally decannulated 05/01/20.  Hemoperitoneum- s/p exlap with repair of SB mesenteric  laceration x2 by Dr. Redmond Pulling, also noted mesenteric hematoma. Staples removed  Dislodged PEG tube- replaced in IR 12/28 ComminutedR femur fx - Ortho c/s, Dr. Doreatha Martin, s/p IMN 12/17 and ORIF. NWB RLE Open R tib/fib fxs- Ortho c/s, Dr. Doreatha Martin, s/p I&D and IMN. NWB RLE Skin excoriations groins and buttocks from moisture - continue foley and flexiseal today - plan for a couple days to allow healing ABLA FEN -TF ID-+ BC 12/27 (staph). Vanc 12/28 - 1/4 DVT - SCDs,SQHHeparin Foley - replaced 1/5, see above Dispo - 4NP, PT/OT/SLP. Await insurance approval for CIR.  LOS: 21 days    Georganna Skeans, MD, MPH, FACS Trauma & General Surgery Use [email or web address] to contact on call provider  1/6/2022Patient ID: Adam Bates, male   DOB: 1970/08/24, 50 y.o.   MRN: [id number]
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
## 2020-05-08 NOTE — Progress Notes (Signed)
Inpatient Rehabilitation Admissions Coordinator  I have received, insurance approval for CIR admit , but no bed available today. I contacted pt's wife and she is aware. I will follow up tomorrow for clarification of when CIR bed will be expected for him.  Ottie Glazier, RN, MSN Rehab Admissions Coordinator [phone] 05/08/2020 11:31 AM
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
## 2020-05-09 ENCOUNTER — Inpatient Hospital Stay (HOSPITAL_COMMUNITY)
Admission: RE | Admit: 2020-05-09 | Discharge: 2020-06-24 | DRG: 945 | Disposition: A | Discharge status: Home / Self Care | Payer: Managed Care, Other (non HMO) | Source: Intra-hospital | Attending: Physical Medicine & Rehabilitation | Admitting: Physical Medicine & Rehabilitation

## 2020-05-09 ENCOUNTER — Other Ambulatory Visit: Payer: Self-pay

## 2020-05-09 DIAGNOSIS — S066X9D Traumatic subarachnoid hemorrhage with loss of consciousness of unspecified duration, subsequent encounter: Principal | ICD-10-CM

## 2020-05-09 DIAGNOSIS — S069X5S Unspecified intracranial injury with loss of consciousness greater than 24 hours with return to pre-existing conscious level, sequela: Secondary | ICD-10-CM | POA: Diagnosis not present

## 2020-05-09 DIAGNOSIS — Z79899 Other long term (current) drug therapy: Secondary | ICD-10-CM

## 2020-05-09 DIAGNOSIS — S2242XD Multiple fractures of ribs, left side, subsequent encounter for fracture with routine healing: Secondary | ICD-10-CM

## 2020-05-09 DIAGNOSIS — R7881 Bacteremia: Secondary | ICD-10-CM | POA: Diagnosis present

## 2020-05-09 DIAGNOSIS — H53461 Homonymous bilateral field defects, right side: Secondary | ICD-10-CM | POA: Diagnosis present

## 2020-05-09 DIAGNOSIS — I82461 Acute embolism and thrombosis of right calf muscular vein: Secondary | ICD-10-CM | POA: Diagnosis present

## 2020-05-09 DIAGNOSIS — R1310 Dysphagia, unspecified: Secondary | ICD-10-CM | POA: Diagnosis present

## 2020-05-09 DIAGNOSIS — S062X4S Diffuse traumatic brain injury with loss of consciousness of 6 hours to 24 hours, sequela: Secondary | ICD-10-CM | POA: Diagnosis present

## 2020-05-09 DIAGNOSIS — B964 Proteus (mirabilis) (morganii) as the cause of diseases classified elsewhere: Secondary | ICD-10-CM | POA: Diagnosis not present

## 2020-05-09 DIAGNOSIS — F919 Conduct disorder, unspecified: Secondary | ICD-10-CM | POA: Diagnosis not present

## 2020-05-09 DIAGNOSIS — F909 Attention-deficit hyperactivity disorder, unspecified type: Secondary | ICD-10-CM | POA: Diagnosis present

## 2020-05-09 DIAGNOSIS — S82401E Unspecified fracture of shaft of right fibula, subsequent encounter for open fracture type I or II with routine healing: Secondary | ICD-10-CM | POA: Diagnosis not present

## 2020-05-09 DIAGNOSIS — Z79890 Hormone replacement therapy (postmenopausal): Secondary | ICD-10-CM

## 2020-05-09 DIAGNOSIS — Z931 Gastrostomy status: Secondary | ICD-10-CM

## 2020-05-09 DIAGNOSIS — W19XXXA Unspecified fall, initial encounter: Secondary | ICD-10-CM

## 2020-05-09 DIAGNOSIS — S069X0A Unspecified intracranial injury without loss of consciousness, initial encounter: Secondary | ICD-10-CM

## 2020-05-09 DIAGNOSIS — Z781 Physical restraint status: Secondary | ICD-10-CM | POA: Diagnosis not present

## 2020-05-09 DIAGNOSIS — L89021 Pressure ulcer of left elbow, stage 1: Secondary | ICD-10-CM | POA: Diagnosis present

## 2020-05-09 DIAGNOSIS — R451 Restlessness and agitation: Secondary | ICD-10-CM | POA: Diagnosis present

## 2020-05-09 DIAGNOSIS — N390 Urinary tract infection, site not specified: Secondary | ICD-10-CM

## 2020-05-09 DIAGNOSIS — H9190 Unspecified hearing loss, unspecified ear: Secondary | ICD-10-CM | POA: Diagnosis present

## 2020-05-09 DIAGNOSIS — I82409 Acute embolism and thrombosis of unspecified deep veins of unspecified lower extremity: Secondary | ICD-10-CM | POA: Diagnosis not present

## 2020-05-09 DIAGNOSIS — Z881 Allergy status to other antibiotic agents status: Secondary | ICD-10-CM

## 2020-05-09 DIAGNOSIS — S270XXD Traumatic pneumothorax, subsequent encounter: Secondary | ICD-10-CM

## 2020-05-09 DIAGNOSIS — S82201E Unspecified fracture of shaft of right tibia, subsequent encounter for open fracture type I or II with routine healing: Secondary | ICD-10-CM | POA: Diagnosis not present

## 2020-05-09 DIAGNOSIS — R1312 Dysphagia, oropharyngeal phase: Secondary | ICD-10-CM

## 2020-05-09 DIAGNOSIS — T148XXA Other injury of unspecified body region, initial encounter: Secondary | ICD-10-CM | POA: Diagnosis not present

## 2020-05-09 DIAGNOSIS — D62 Acute posthemorrhagic anemia: Secondary | ICD-10-CM | POA: Diagnosis present

## 2020-05-09 DIAGNOSIS — F32A Depression, unspecified: Secondary | ICD-10-CM | POA: Diagnosis present

## 2020-05-09 DIAGNOSIS — I82451 Acute embolism and thrombosis of right peroneal vein: Secondary | ICD-10-CM | POA: Diagnosis present

## 2020-05-09 DIAGNOSIS — S069X9A Unspecified intracranial injury with loss of consciousness of unspecified duration, initial encounter: Secondary | ICD-10-CM | POA: Diagnosis present

## 2020-05-09 DIAGNOSIS — S72141D Displaced intertrochanteric fracture of right femur, subsequent encounter for closed fracture with routine healing: Secondary | ICD-10-CM

## 2020-05-09 DIAGNOSIS — L89011 Pressure ulcer of right elbow, stage 1: Secondary | ICD-10-CM | POA: Diagnosis present

## 2020-05-09 DIAGNOSIS — G479 Sleep disorder, unspecified: Secondary | ICD-10-CM

## 2020-05-09 DIAGNOSIS — Z87820 Personal history of traumatic brain injury: Secondary | ICD-10-CM

## 2020-05-09 DIAGNOSIS — L89616 Pressure-induced deep tissue damage of right heel: Secondary | ICD-10-CM | POA: Diagnosis present

## 2020-05-09 DIAGNOSIS — R195 Other fecal abnormalities: Secondary | ICD-10-CM | POA: Diagnosis not present

## 2020-05-09 DIAGNOSIS — Z91030 Bee allergy status: Secondary | ICD-10-CM

## 2020-05-09 DIAGNOSIS — Z4659 Encounter for fitting and adjustment of other gastrointestinal appliance and device: Secondary | ICD-10-CM

## 2020-05-09 DIAGNOSIS — S36893D Laceration of other intra-abdominal organs, subsequent encounter: Secondary | ICD-10-CM | POA: Diagnosis not present

## 2020-05-09 DIAGNOSIS — Z888 Allergy status to other drugs, medicaments and biological substances status: Secondary | ICD-10-CM | POA: Diagnosis not present

## 2020-05-09 DIAGNOSIS — S82401S Unspecified fracture of shaft of right fibula, sequela: Secondary | ICD-10-CM

## 2020-05-09 DIAGNOSIS — S069X3S Unspecified intracranial injury with loss of consciousness of 1 hour to 5 hours 59 minutes, sequela: Secondary | ICD-10-CM | POA: Diagnosis not present

## 2020-05-09 DIAGNOSIS — S82201S Unspecified fracture of shaft of right tibia, sequela: Secondary | ICD-10-CM | POA: Diagnosis not present

## 2020-05-09 DIAGNOSIS — S069X0D Unspecified intracranial injury without loss of consciousness, subsequent encounter: Secondary | ICD-10-CM | POA: Diagnosis not present

## 2020-05-09 DIAGNOSIS — S069X0S Unspecified intracranial injury without loss of consciousness, sequela: Secondary | ICD-10-CM | POA: Diagnosis not present

## 2020-05-09 LAB — GLUCOSE, CAPILLARY
Glucose-Capillary: 112 mg/dL — ABNORMAL HIGH (ref 70–99)
Glucose-Capillary: 116 mg/dL — ABNORMAL HIGH (ref 70–99)
Glucose-Capillary: 131 mg/dL — ABNORMAL HIGH (ref 70–99)
Glucose-Capillary: 88 mg/dL (ref 70–99)
Glucose-Capillary: 96 mg/dL (ref 70–99)

## 2020-05-09 LAB — CREATININE, SERUM
Creatinine, Ser: 0.68 mg/dL (ref 0.61–1.24)
GFR, Estimated: >60 mL/min (ref >60)

## 2020-05-09 MED ORDER — NYSTATIN 100000 UNIT/GM EX POWD
Freq: Three times a day (TID) | CUTANEOUS | Status: DC
  Administered 2020-05-10 – 2020-06-01 (×8): 1 via TOPICAL
  Filled 2020-05-09 (×2): qty 15

## 2020-05-09 MED ORDER — LORAZEPAM 2 MG/ML IJ SOLN
1.0000 mg | Freq: Once | INTRAMUSCULAR | Status: AC | PRN
  Administered 2020-05-10: 1 mg via INTRAMUSCULAR
  Filled 2020-05-09: qty 1

## 2020-05-09 MED ORDER — FAMOTIDINE 20 MG PO TABS
20.0000 mg | ORAL_TABLET | Freq: Two times a day (BID) | ORAL | Status: DC
  Administered 2020-05-09 – 2020-05-23 (×28): 20 mg
  Filled 2020-05-09 (×28): qty 1

## 2020-05-09 MED ORDER — AMPHETAMINE-DEXTROAMPHETAMINE 10 MG PO TABS
10.0000 mg | ORAL_TABLET | Freq: Two times a day (BID) | ORAL | Status: DC
  Administered 2020-05-10 – 2020-05-13 (×7): 10 mg
  Filled 2020-05-09 (×7): qty 1

## 2020-05-09 MED ORDER — METHOCARBAMOL 500 MG PO TABS
500.0000 mg | ORAL_TABLET | Freq: Four times a day (QID) | ORAL | Status: DC | PRN
  Administered 2020-05-09 – 2020-05-19 (×15): 500 mg
  Filled 2020-05-09 (×16): qty 1

## 2020-05-09 MED ORDER — OXYCODONE HCL 5 MG/5ML PO SOLN
5.0000 mg | Freq: Four times a day (QID) | ORAL | Status: DC | PRN
  Administered 2020-05-09 – 2020-05-23 (×29): 10 mg
  Filled 2020-05-09 (×33): qty 10

## 2020-05-09 MED ORDER — TRAZODONE HCL 50 MG PO TABS
50.0000 mg | ORAL_TABLET | Freq: Every evening | ORAL | Status: DC | PRN
  Administered 2020-05-09 – 2020-05-19 (×7): 50 mg
  Filled 2020-05-09 (×9): qty 1

## 2020-05-09 MED ORDER — DOCUSATE SODIUM 50 MG/5ML PO LIQD
100.0000 mg | Freq: Two times a day (BID) | ORAL | Status: DC
  Administered 2020-05-10: 100 mg
  Filled 2020-05-09 (×2): qty 10

## 2020-05-09 MED ORDER — ONDANSETRON 4 MG PO TBDP
4.0000 mg | ORAL_TABLET | Freq: Four times a day (QID) | ORAL | Status: DC | PRN
  Filled 2020-05-09: qty 1

## 2020-05-09 MED ORDER — QUETIAPINE FUMARATE 50 MG PO TABS
100.0000 mg | ORAL_TABLET | Freq: Three times a day (TID) | ORAL | Status: DC
  Administered 2020-05-09 – 2020-05-10 (×2): 100 mg
  Filled 2020-05-09 (×2): qty 2

## 2020-05-09 MED ORDER — POLYETHYLENE GLYCOL 3350 17 G PO PACK: 17.0000 g | PACK | Freq: Every day | ORAL | Status: DC | PRN

## 2020-05-09 MED ORDER — DIPHENHYDRAMINE HCL 12.5 MG/5ML PO ELIX
25.0000 mg | ORAL_SOLUTION | Freq: Four times a day (QID) | ORAL | Status: DC | PRN
  Administered 2020-05-10 – 2020-05-13 (×3): 25 mg
  Filled 2020-05-09 (×3): qty 10

## 2020-05-09 MED ORDER — ONDANSETRON HCL 4 MG/2ML IJ SOLN: 4.0000 mg | Freq: Four times a day (QID) | INTRAMUSCULAR | Status: DC | PRN

## 2020-05-09 MED ORDER — CHLORHEXIDINE GLUCONATE 0.12 % MT SOLN
15.0000 mL | Freq: Two times a day (BID) | OROMUCOSAL | Status: DC
  Administered 2020-05-10 – 2020-05-23 (×17): 15 mL via OROMUCOSAL
  Filled 2020-05-09 (×23): qty 15

## 2020-05-09 MED ORDER — ENOXAPARIN SODIUM 40 MG/0.4ML ~~LOC~~ SOLN
40.0000 mg | SUBCUTANEOUS | Status: DC
  Administered 2020-05-09 – 2020-05-10 (×2): 40 mg via SUBCUTANEOUS
  Filled 2020-05-09 (×2): qty 0.4

## 2020-05-09 MED ORDER — PROSOURCE TF PO LIQD
90.0000 mL | Freq: Two times a day (BID) | ORAL | Status: DC
  Administered 2020-05-09 – 2020-05-23 (×26): 90 mL
  Filled 2020-05-09 (×26): qty 90

## 2020-05-09 MED ORDER — DIPHENHYDRAMINE HCL 50 MG/ML IJ SOLN
25.0000 mg | Freq: Four times a day (QID) | INTRAMUSCULAR | Status: DC | PRN
  Filled 2020-05-09: qty 0.5

## 2020-05-09 MED ORDER — VITAMIN D 25 MCG (1000 UNIT) PO TABS
1000.0000 [IU] | ORAL_TABLET | Freq: Every day | ORAL | Status: DC
  Administered 2020-05-10 – 2020-05-23 (×15): 1000 [IU]
  Filled 2020-05-09 (×15): qty 1

## 2020-05-09 MED ORDER — OSMOLITE 1.5 CAL PO LIQD
150.0000 mL | Freq: Every day | ORAL | Status: DC
  Administered 2020-05-09: 150 mL
  Administered 2020-05-10: 237 mL
  Administered 2020-05-10: 150 mL

## 2020-05-09 MED ORDER — ACETAMINOPHEN 160 MG/5ML PO SOLN
1000.0000 mg | Freq: Four times a day (QID) | ORAL | Status: DC
  Administered 2020-05-09 – 2020-05-23 (×48): 1000 mg
  Filled 2020-05-09 (×51): qty 40.6

## 2020-05-09 MED ORDER — CITALOPRAM HYDROBROMIDE 10 MG/5ML PO SOLN
20.0000 mg | Freq: Every day | ORAL | Status: DC
  Administered 2020-05-09 – 2020-05-20 (×12): 20 mg
  Filled 2020-05-09 (×12): qty 10

## 2020-05-09 MED ORDER — OSMOLITE 1.5 CAL PO LIQD: 1000.0000 mL | ORAL | Status: DC

## 2020-05-09 MED ORDER — LORAZEPAM 2 MG/ML IJ SOLN
1.0000 mg | Freq: Once | INTRAMUSCULAR | Status: AC
  Administered 2020-05-09: 1 mg via INTRAMUSCULAR
  Filled 2020-05-09: qty 1

## 2020-05-09 NOTE — Progress Notes (Signed)
Inpatient Rehabilitation Medication Review by a Pharmacist  A complete drug regimen review was completed for this patient to identify any potential clinically significant medication issues.  Clinically significant medication issues were identified:  no   Type of Medication Issue Identified Description of Issue Urgent (address now) Non-Urgent (address on AM team rounds) Plan Plan Accepted by Provider? (Yes / No / Pending AM Rounds)  Other  Adderall XR and IR both prescribed PTA.  Continuing Adderall IR inpatient.  Cannot administer Adderall XR product via tube.  Also noted Cymblata PTA.  MD has started citalopram instead.  Plan for discharge med review to ensure no duplicate therapy on discharge.      Time spent performing this drug regimen review (minutes):  15   Adam Bates, Rocky Crafts 05/09/2020 7:31 PM
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
## 2020-05-09 NOTE — Progress Notes (Signed)
OT NOTE Second session to transfer back to bed safely     05/09/20 1300  OT Visit Information  Last OT Received On 05/11/20  Assistance Needed +2  PT/OT/SLP Co-Evaluation/Treatment Yes  Reason for Co-Treatment Complexity of the patient's impairments (multi-system involvement);Necessary to address cognition/behavior during functional activity;For patient/therapist safety;To address functional/ADL transfers  OT goals addressed during session ADL's and self-care;Proper use of Adaptive equipment and DME;Strengthening/ROM  History of Present Illness Pt is a 50yo male s/p [facility] who sustained TBI, SAH, IPH with DAI. facial fx -no-op, L rib fx 5-11, 4-7 with PTX and bilat pulm contusions, s/pe xlap with repair of SB mesenteric lac x2 and, comminuted R femur fx and R tib/fib fracture - s/p ORIF. R LE NWB.  Pt intubated. Pt trached and pegged on 12/24, decannulated 12/30. 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  Precautions  Precautions Fall  Precaution Comments TBI, peg tube/abdominal binder, catheter  Required Braces or Orthoses Other Brace  Pain Assessment  Pain Assessment No/denies pain  Cognition  Arousal/Alertness Awake/alert  Behavior During Therapy Impulsive;Restless  Overall Cognitive Status Impaired/Different from baseline  Area of Impairment Orientation;Attention;Following commands;Memory;Safety/judgement;Problem solving  Orientation Level Disoriented to;Place;Time;Situation  Current Attention Level Focused  Memory Decreased recall of precautions;Decreased short-term memory  Following Commands Follows one step commands inconsistently  Safety/Judgement Decreased awareness of safety;Decreased awareness of deficits  Awareness Intellectual  Problem Solving Slow processing;Decreased initiation;Difficulty sequencing;Requires verbal cues;Requires tactile cues  General Comments Pt agreeable to chair to bed transfer. pt verbal responses are demonstrating behavior  more of [place] recovery IV level . Pt remains inappropriate speech and language of confusion in attention to behavior. Pt expressing more agitation  Lower Extremity Assessment  RLE Deficits / Details requires Max (A) to maintain NWB during transfer  ADL  Overall ADL's  Needs assistance/impaired  Bed Mobility  Overal bed mobility Needs Assistance  Bed Mobility Sit to Supine  Sit to supine Min assist  General bed mobility comments pt motivated to return supine and lifting bil LE toward bed surface but requires (A) to complete task. pt reclining and placing bil UE under head in a very relaxed positiong  Balance  Overall balance assessment Needs assistance  Standing balance support Bilateral upper extremity supported;During functional activity  Standing balance-Leahy Scale Poor  Standing balance comment reliant on external support of OT/PT weight shift to maintain NWB R LE  Restrictions  Weight Bearing Restrictions Yes  RLE Weight Bearing NWB  Rancho Levels of Cognitive Functioning  Rancho Los Amigos Scales of Cognitive Functioning IV  Transfers  Overall transfer level Needs assistance  Equipment used Rolling walker (2 wheeled)  Transfers Sit to/from [employer] Transfers  Sit to Stand Mod assist;+2 physical assistance;+2 safety/equipment  Stand pivot transfers Max assist;+2 physical assistance;+2 safety/equipment  General transfer comment Pt transfering toward L side and mod cues for visual attention to task. pt does swing hips toward bed surface and sitting uncontrolled  General Comments  General comments (skin integrity, edema, etc.) VSS  Other Exercises  Other Exercises pt sitting in chair with decreased posture in the chair R UE completely out of mitten and using it to open L mitten and wrist restraint.  OT - End of Session  Equipment Utilized During Treatment Gait belt;Rolling walker  Activity Tolerance Patient tolerated treatment well  Patient left in bed;with call  bell/phone within reach;with restraints reapplied;with bed alarm set  Nurse Communication Mobility status;Precautions;Weight bearing status  OT Assessment/Plan  OT  Plan Discharge plan remains appropriate  OT Visit Diagnosis Muscle weakness (generalized) (M62.81);Other abnormalities of gait and mobility (R26.89);Other symptoms and signs involving cognitive function;Cognitive communication deficit (R41.841)  OT Frequency (ACUTE ONLY) Min 2X/week  Recommendations for Other Services Rehab consult;Other (comment)  Follow Up Recommendations CIR  OT Equipment Wheelchair (measurements OT);Wheelchair cushion (measurements OT);Hospital bed;3 in 1 bedside commode  AM-PAC OT "6 Clicks" Daily Activity Outcome Measure (Version 2)  Help from another person eating meals? 2  Help from another person taking care of personal grooming? 2  Help from another person toileting, which includes using toliet, bedpan, or urinal? 2  Help from another person bathing (including washing, rinsing, drying)? 2  Help from another person to put on and taking off regular upper body clothing? 2  Help from another person to put on and taking off regular lower body clothing? 2  6 Click Score 12  OT Goal Progression  Progress towards OT goals Progressing toward goals  Acute Rehab OT Goals  Patient Stated Goal unable to state  OT Goal Formulation Patient unable to participate in goal setting  Time For Goal Achievement 05/12/20  Potential to Achieve Goals Good  ADL Goals  Pt Will Perform Grooming with mod assist;sitting  Pt/caregiver will Perform Home Exercise Program Increased strength;Increased ROM;Both right and left upper extremity;With minimal assist;With written HEP provided  Additional ADL Goal #1 Pt will follow 1 step commands with >75% accuracy during ADL/functional task.  Additional ADL Goal #2 Pt will maintain eyes open >5 min during functional task.  Additional ADL Goal #3 Pt will maintain sitting balance EOB >10 with  min (A)  OT Time Calculation  OT Start Time (ACUTE ONLY) 1048  OT Stop Time (ACUTE ONLY) 1101  OT Time Calculation (min) 13 min  OT General Charges  $OT Visit 1 Visit  OT Treatments  $Therapeutic Activity 8-22 mins    Brynn, OTR/L  Acute Rehabilitation Services Pager: [phone] Office: [phone] .

## 2020-05-09 NOTE — Progress Notes (Signed)
Physical Therapy Treatment Patient Details Name: Adam Bates MRN: [id number] DOB: 03-14-1971 Today's Date: 05/09/2020    History of Present Illness Pt is a 50yo male s/p [facility] who sustained TBI, SAH, IPH with DAI. facial fx -no-op, L rib fx 5-11, 4-7 with PTX and bilat pulm contusions, s/pe xlap with repair of SB mesenteric lac x2 and, comminuted R femur fx and R tib/fib fracture - s/p ORIF. R LE NWB.  Pt intubated. Pt trached and pegged on 12/24, decannulated 12/30. 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    PT Comments    Pt seen for second session to allow for safe return to bed following SLP session this morning. The pt continues to require maxA of 2 to power up, steady, and to take small lateral hops to transfer from chair<>bed while maintaining NWB RLE. The pt presents with behaviors consistent with ranchos level 4 - confused agitated, and did display increased frustration and moments of agitation with mobility during this session. The pt is redirectable, and able to follow simple commands, but requires significant assist and cues for safety and awareness at this time. Continue to recommend CIR therapies.    Follow Up Recommendations  CIR     Equipment Recommendations   (defer to post acute)    Recommendations for Other Services       Precautions / Restrictions Precautions Precautions: Fall Precaution Comments: TBI, peg tube/abdominal binder, catheter /  OT / PT arriving to flexiseal in the bed next to the patient d/c from the patients body Required Braces or Orthoses: Other Brace Restrictions Weight Bearing Restrictions: Yes RLE Weight Bearing: Non weight bearing    Mobility  Bed Mobility Overal bed mobility: Needs Assistance Bed Mobility: Rolling;Sit to Supine Rolling: Max assist   Supine to sit: Mod assist;+2 for safety/equipment Sit to supine: Mod assist;+2 for physical assistance;+2 for safety/equipment   General bed mobility  comments: mod A to BLE to move to EOB, minA to raise trunk from EOB. Then minA to return to supine with minA to BLE to return to bed  Transfers Overall transfer level: Needs assistance Equipment used: Rolling walker (2 wheeled) Transfers: Sit to/from [employer] Sit to Stand: Mod assist;+2 physical assistance;+2 safety/equipment Stand pivot transfers: Max assist;+2 physical assistance;+2 safety/equipment       General transfer comment: modA to power up and steady, maxA of 2 to direct turn/hop to recliner. assist to steady, manage RW, maintain NWB RLE on therapist's foot, and to move hips while sitting  Ambulation/Gait Ambulation/Gait assistance: Max assist;+2 physical assistance;+2 safety/equipment Gait Distance (Feet): 3 Feet Assistive device: Rolling walker (2 wheeled)       General Gait Details: small lateral hop towards bed from recliner with maxA of 2 and use of RW to maintain stability minimal clearance with hops.   Stairs             Wheelchair Mobility    Modified Rankin (Stroke Patients Only)       Balance Overall balance assessment: Needs assistance Sitting-balance support: Single extremity supported;Feet supported Sitting balance-Leahy Scale: Fair Sitting balance - Comments: Pt tolerated sitting EOB only very briefly due to agitated state and impulsivity at EOB   Standing balance support: Bilateral upper extremity supported Standing balance-Leahy Scale: Poor Standing balance comment: reliant on external support of OT/PT  Cognition Arousal/Alertness: Awake/alert Behavior During Therapy: Impulsive;Restless Overall Cognitive Status: Impaired/Different from baseline Area of Impairment: Orientation;Attention;Following commands;Memory;Safety/judgement;Problem solving               Rancho Levels of Cognitive Functioning Rancho Los Amigos Scales of Cognitive Functioning:  Confused/agitated Orientation Level: Disoriented to;Place;Time;Situation Current Attention Level: Focused Memory: Decreased recall of precautions;Decreased short-term memory Following Commands: Follows one step commands inconsistently Safety/Judgement: Decreased awareness of safety;Decreased awareness of deficits Awareness: Intellectual Problem Solving: Slow processing;Decreased initiation;Difficulty sequencing;Requires verbal cues;Requires tactile cues General Comments: pt making inappropriate responses to questions. pt self distracted. pt at times during session asking if anyone else is present. pt demonstrates visual changes this session but lacks awareness. pt states "little whiskey" when asked if vision was blurry. Pt appropriately greeting staff when entering room      Exercises General Exercises - Lower Extremity Ankle Circles/Pumps: AROM;Both;10 reps;Supine Quad Sets:  (long sitting) Other Exercises Other Exercises: pt sitting in chair with decreased posture in the chair R UE completely out of mitten and using it to open L mitten and wrist restraint.    General Comments General comments (skin integrity, edema, etc.): VSS through session      Pertinent Vitals/Pain Pain Assessment: 0-10 Faces Pain Scale: Hurts a little bit Pain Location: RLE during mobility, genital area (red and irritated) Pain Descriptors / Indicators: Restless;Discomfort;Grimacing Pain Intervention(s): Limited activity within patient's tolerance;Monitored during session;Repositioned    Home Living                      Prior Function            PT Goals (current goals can now be found in the care plan section) Acute Rehab PT Goals Patient Stated Goal: unable to state PT Goal Formulation: With family Time For Goal Achievement: 05/11/20 Potential to Achieve Goals: Fair Progress towards PT goals: Progressing toward goals    Frequency    Min 4X/week      PT Plan Current plan remains  appropriate    Co-evaluation PT/OT/SLP Co-Evaluation/Treatment: Yes Reason for Co-Treatment: Complexity of the patient's impairments (multi-system involvement);Necessary to address cognition/behavior during functional activity;For patient/therapist safety;To address functional/ADL transfers PT goals addressed during session: Mobility/safety with mobility;Balance OT goals addressed during session: ADL's and self-care;Proper use of Adaptive equipment and DME;Strengthening/ROM      AM-PAC PT "6 Clicks" Mobility   Outcome Measure  Help needed turning from your back to your side while in a flat bed without using bedrails?: A Lot Help needed moving from lying on your back to sitting on the side of a flat bed without using bedrails?: A Lot Help needed moving to and from a bed to a chair (including a wheelchair)?: A Lot Help needed standing up from a chair using your arms (e.g., wheelchair or bedside chair)?: A Lot Help needed to walk in hospital room?: Total Help needed climbing 3-5 steps with a railing? : Total 6 Click Score: 10    End of Session Equipment Utilized During Treatment: Gait belt Activity Tolerance: Patient limited by fatigue Patient left: with call bell/phone within reach;with bed alarm set Nurse Communication: Mobility status PT Visit Diagnosis: Muscle weakness (generalized) (M62.81);Difficulty in walking, not elsewhere classified (R26.2)     Time: 1048-1100 PT Time Calculation (min) (ACUTE ONLY): 12 min  Charges:  $Therapeutic Activity: 23-37 mins                     Karma Ganja, PT, DPT  Acute Rehabilitation Department Pager #: [phone]   Otho Bellows 05/09/2020, 1:57 PM

## 2020-05-09 NOTE — Progress Notes (Signed)
Physical Therapy Treatment Patient Details Name: Adam Bates MRN: [id number] DOB: Aug 07, 1970 Today's Date: 05/09/2020    History of Present Illness Pt is a 50yo male s/p [facility] who sustained TBI, SAH, IPH with DAI. facial fx -no-op, L rib fx 5-11, 4-7 with PTX and bilat pulm contusions, s/pe xlap with repair of SB mesenteric lac x2 and, comminuted R femur fx and R tib/fib fracture - s/p ORIF. R LE NWB.  Pt intubated. Pt trached and pegged on 12/24, decannulated 12/30. 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    PT Comments    Pt seen by PT/OT this morning to improve safety with OOB transfers and mobility progression. The pt was able to complete multiple sit-stand transfers with modA of 2 and RW to maintain stability and NWB RLE. The pt was then able to attempt small lateral hops to recliner to complete pivot transfer, but requires increased assist to manage RW and direct movement. The pt remains limited by deficits in safety awareness, orientation, problem solving, and memory that limit his ability to safely progress mobility without significant assistance at this time. The pt is presenting with behaviors consistent with ranchos level 4 - confused agitated. The pt continues to be a great candidate for CIR level therapies.     Follow Up Recommendations  CIR     Equipment Recommendations   (defer to post acute)    Recommendations for Other Services       Precautions / Restrictions Precautions Precautions: Fall Precaution Comments: TBI, peg tube/abdominal binder, catheter Restrictions Weight Bearing Restrictions: Yes RLE Weight Bearing: Non weight bearing    Mobility  Bed Mobility Overal bed mobility: Needs Assistance Bed Mobility: Supine to Sit;Sit to Supine     Supine to sit: Mod assist;+2 for safety/equipment Sit to supine: +2 for physical assistance;+2 for safety/equipment;Min assist   General bed mobility comments: mod A to BLE to move to EOB,  minA to raise trunk from EOB. Then minA to return to supine with minA to BLE to return to bed  Transfers Overall transfer level: Needs assistance Equipment used: Rolling walker (2 wheeled) Transfers: Sit to/from [employer] Sit to Stand: Mod assist;+2 physical assistance;+2 safety/equipment Stand pivot transfers: Max assist;+2 physical assistance;+2 safety/equipment       General transfer comment: modA to power up and steady, maxA of 2 to direct turn/hop to recliner. assist to steady, manage RW, maintain NWB RLE on therapist's foot, and to move hips while sitting  Ambulation/Gait Ambulation/Gait assistance: Max assist;+2 physical assistance;+2 safety/equipment Gait Distance (Feet): 3 Feet Assistive device: Rolling walker (2 wheeled)       General Gait Details: small lateral hop towards recliner with maxA of 2 and use of RW to maintain stability minimal clearance with hops.       Balance Overall balance assessment: Needs assistance Sitting-balance support: Single extremity supported;Feet supported Sitting balance-Leahy Scale: Fair Sitting balance - Comments: Pt tolerated sitting EOB only very briefly due to agitated state and impulsivity at EOB   Standing balance support: Bilateral upper extremity supported Standing balance-Leahy Scale: Poor Standing balance comment: reliant on external support of OT/PT                            Cognition Arousal/Alertness: Awake/alert Behavior During Therapy: Impulsive;Restless Overall Cognitive Status: Impaired/Different from baseline Area of Impairment: Orientation;Attention;Following commands;Memory;Safety/judgement;Problem solving  Rancho Levels of Cognitive Functioning Rancho Los Amigos Scales of Cognitive Functioning: Confused/agitated Orientation Level: Disoriented to;Place;Time;Situation Current Attention Level: Focused Memory: Decreased recall of precautions;Decreased short-term  memory Following Commands: Follows one step commands inconsistently Safety/Judgement: Decreased awareness of safety;Decreased awareness of deficits   Problem Solving: Slow processing;Decreased initiation;Difficulty sequencing;Requires verbal cues;Requires tactile cues General Comments: pt responds to name and attempts to answer questions, but is not oriented and requires redirection and cues for safety. fidgety, pulling at lines/leads through session due to irritation. inappropriate behavior included asking for kisses from staff and moments of agitation and cussing followed by effusive apologies. decreased attention to L      Exercises General Exercises - Lower Extremity Ankle Circles/Pumps: AROM;Both;10 reps;Supine Quad Sets: AAROM;Both;5 reps;Seated (long sitting)    General Comments General comments (skin integrity, edema, etc.): VSS through session      Pertinent Vitals/Pain Pain Assessment: Faces Faces Pain Scale: Hurts a little bit Pain Location: RLE during mobility, genital area (red and irritated) Pain Descriptors / Indicators: Restless;Discomfort;Grimacing Pain Intervention(s): Limited activity within patient's tolerance;Monitored during session;Repositioned           PT Goals (current goals can now be found in the care plan section) Acute Rehab PT Goals Patient Stated Goal: unable to state PT Goal Formulation: With family Time For Goal Achievement: 05/11/20 Potential to Achieve Goals: Fair Progress towards PT goals: Progressing toward goals    Frequency    Min 4X/week      PT Plan Current plan remains appropriate    Co-evaluation PT/OT/SLP Co-Evaluation/Treatment: Yes Reason for Co-Treatment: Complexity of the patient's impairments (multi-system involvement);Necessary to address cognition/behavior during functional activity;For patient/therapist safety;To address functional/ADL transfers PT goals addressed during session: Mobility/safety with  mobility;Balance;Strengthening/ROM        AM-PAC PT "6 Clicks" Mobility   Outcome Measure  Help needed turning from your back to your side while in a flat bed without using bedrails?: A Lot Help needed moving from lying on your back to sitting on the side of a flat bed without using bedrails?: A Lot Help needed moving to and from a bed to a chair (including a wheelchair)?: A Lot Help needed standing up from a chair using your arms (e.g., wheelchair or bedside chair)?: A Lot Help needed to walk in hospital room?: Total Help needed climbing 3-5 steps with a railing? : Total 6 Click Score: 10    End of Session Equipment Utilized During Treatment: Gait belt Activity Tolerance: Patient limited by fatigue Patient left: in chair;with call bell/phone within reach;with chair alarm set;with restraints reapplied (SLP) Nurse Communication: Mobility status (rectal tube out) PT Visit Diagnosis: Muscle weakness (generalized) (M62.81);Difficulty in walking, not elsewhere classified (R26.2)     Time: 7782-4235 PT Time Calculation (min) (ACUTE ONLY): 44 min  Charges:  $Therapeutic Activity: 23-37 mins                     Karma Ganja, PT, DPT   Acute Rehabilitation Department Pager #: [phone]   Otho Bellows 05/09/2020, 1:06 PM

## 2020-05-09 NOTE — Progress Notes (Signed)
Patient admitted to [phone], spoke with wife about expectations for rehab and fall and visitor policies.

## 2020-05-09 NOTE — Progress Notes (Signed)
Patient remains on bilateral soft restraints/soft waist restrains due to patient's lacks of safety awareness, pulling at lines/tubes/medical equipment, attempting getting out of bed. Does not follow simple commands, and all efforts to redirect and reorientation patient proved abortive. PRN agitation/PRN pain administered to no avail. Restraints released every 2hrs, skin assessment performed, no skin tear/discoloration noted Patient turned and repositioned. Call bell within reach. Will continue monitoring.

## 2020-05-09 NOTE — Progress Notes (Addendum)
Inpatient Rehabilitation Admissions Coordinator  Cir bed is available to admit patient to today. I contacted Trauma PA, Legrand Como and spoke with pt's wife by phone. I will alert acute team and TOC and make arrangements to admit today.  Danne Baxter, RN, MSN Rehab Admissions Coordinator [phone] 05/09/2020 12:07 PM

## 2020-05-09 NOTE — Progress Notes (Signed)
  Speech Language Pathology Treatment: Dysphagia;Cognitive-Linquistic  Patient Details Name: GRIFFITH SANTILLI MRN: [id number] DOB: 12-11-1970 Today's Date: 05/09/2020 Time: 7829-5621 SLP Time Calculation (min) (ACUTE ONLY): 16 min  Assessment / Plan / Recommendation Clinical Impression  Ezariah seen up in chair following PT/OT and before his meds that typically sedate him. Cooperative and was able to be easily redirected. Verbalizing throughout with language of confusion and moderate amount of messages had no communicative meaning however, he requested for chair to be lowered, more water to drink. He pointed to questions regarding family pictures with 60% acc. Responsive naming questions for biographical information was inaccurate.  Pt brushed teeth with set up. He consumed self administered cup sips water with consistent throat clearing and subtle delayed coughs. One 2 bites pudding taken until he expectorated into pudding cup- post tooth brushing. Rancho V behaviors today (non agitated;confused;inappropriate). Assuming stoma is mostly closed with only one audible escae Hopeful to rehab soon.   HPI HPI: Pt is a 50 y.o. male s/p [facility] who sustained TBI, SAH, IPH with DAI. facial fx -no-op, L rib fx 5-11, 4-7 with PTX and bilat pulm contusions, s/pe xlap with repair of SB mesenteric lac x2 and, comminuted R femur fx and R tib/fib fracture - s/p ORIF. R LE NWB.  Pt intubated. Trach/PEG12/24. 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 Trach collar 12/28. Trach dislodged and pt was ultimately decannulated by [facility] 12/30.      SLP Plan  Continue with current plan of care       Recommendations  Diet recommendations: NPO;Other(comment) (ice chips) Liquids provided via: Teaspoon Medication Administration: Via alternative means                General recommendations: Rehab consult Oral Care Recommendations: Oral care prior to ice chip/H20 Follow up Recommendations:  Inpatient Rehab SLP Visit Diagnosis: Dysphagia, oropharyngeal phase (R13.12);Cognitive communication deficit (H08.657) Plan: Continue with current plan of care                      Houston Siren 05/09/2020, 10:35 AM  Orbie Pyo Colvin Caroli.Ed Risk analyst [phone] Office [phone]
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
## 2020-05-09 NOTE — Progress Notes (Signed)
Patient remains on bilateral soft wrist/soft waist restraints due to patient's lack of safety awareness, unable to follow commands, poor attention/concentration,impulsive behaviors.  All efforts to redirect/reorientate patient proved abortive. External stimuli reduced, lights turned off, music on, PRN agitation/PRN pain administered to no avail. Patient continues pulling at lines/tubes, climbing out of bed, pulling on his foley. Patient turned and repositioned, call bell within reach, foley /peri care performed. No s/s of distress noted with this assessment. Will continue monitoring.

## 2020-05-09 NOTE — Progress Notes (Signed)
OT NOTE  Pt progressed from EOB to chair this session to sit up for SLP session. Pt easily distracted and very fixated on peri area with cues throughout session not to pull on Foley. Pt attempting to scratch at peri area that is very red. Pt R buttock showing bruising and signs of breakdown without opening at this time.     05/09/20 1326  OT Visit Information  Last OT Received On 05/11/20  Assistance Needed +2  PT/OT/SLP Co-Evaluation/Treatment Yes  Reason for Co-Treatment Complexity of the patient's impairments (multi-system involvement);Necessary to address cognition/behavior during functional activity;For patient/therapist safety;To address functional/ADL transfers  OT goals addressed during session ADL's and self-care;Proper use of Adaptive equipment and DME;Strengthening/ROM  History of Present Illness Pt is a 50yo male s/p [facility] who sustained TBI, SAH, IPH with DAI. facial fx -no-op, L rib fx 5-11, 4-7 with PTX and bilat pulm contusions, s/pe xlap with repair of SB mesenteric lac x2 and, comminuted R femur fx and R tib/fib fracture - s/p ORIF. R LE NWB.  Pt intubated. Pt trached and pegged on 12/24, decannulated 12/30. 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  Precautions  Precautions Fall  Precaution Comments TBI, peg tube/abdominal binder, catheter /  OT / PT arriving to flexiseal in the bed next to the patient d/c from the patients body  Pain Assessment  Pain Assessment 0-10  Faces Pain Scale 2  Pain Location RLE during mobility, genital area (red and irritated)  Pain Descriptors / Indicators Restless;Discomfort;Grimacing  Cognition  Arousal/Alertness Awake/alert  Behavior During Therapy Impulsive;Restless  Overall Cognitive Status Impaired/Different from baseline  Area of Impairment Orientation;Attention;Following commands;Memory;Safety/judgement;Problem solving  Orientation Level Disoriented to;Place;Time;Situation  Current Attention Level Focused   Memory Decreased recall of precautions;Decreased short-term memory  Following Commands Follows one step commands inconsistently  Safety/Judgement Decreased awareness of safety;Decreased awareness of deficits  Problem Solving Slow processing;Decreased initiation;Difficulty sequencing;Requires verbal cues;Requires tactile cues  General Comments pt making inappropriate responses to questions. pt self distracted. pt at times during session asking if anyone else is present. pt demonstrates visual changes this session but lacks awareness. pt states "little whiskey" when asked if vision was blurry. Pt appropriately greeting staff when entering room  ADL  Overall ADL's  Needs assistance/impaired  General ADL Comments pt rolling R and L for peri care and lacks awareness to needs but restless due to discomfort. Pt with language of confusion but showing appreciation to hygiene stating "you doing good girl"  Bed Mobility  Overal bed mobility Needs Assistance  Bed Mobility Rolling;Supine to Sit  Rolling Max assist  Supine to sit Mod assist;+2 for safety/equipment  General bed mobility comments mod A to BLE to move to EOB, minA to raise trunk from EOB. Then minA to return to supine with minA to BLE to return to bed  Balance  Overall balance assessment Needs assistance  Sitting-balance support Single extremity supported;Feet supported  Sitting balance-Leahy Scale Fair  Sitting balance - Comments Pt tolerated sitting EOB only very briefly due to agitated state and impulsivity at EOB  Standing balance support Bilateral upper extremity supported  Standing balance-Leahy Scale Poor  Standing balance comment reliant on external support of OT/PT  Restrictions  Weight Bearing Restrictions Yes  RLE Weight Bearing NWB  Rancho Levels of Cognitive Functioning  Rancho Los Amigos Scales of Cognitive Functioning IV  Transfers  Overall transfer level Needs assistance  Equipment used Rolling walker (2 wheeled)   Transfers Sit to/from [employer]  Transfers  Sit to Stand Mod assist;+2 physical assistance;+2 safety/equipment  Stand pivot transfers Max assist;+2 physical assistance;+2 safety/equipment  General transfer comment modA to power up and steady, maxA of 2 to direct turn/hop to recliner. assist to steady, manage RW, maintain NWB RLE on therapist's foot, and to move hips while sitting  General Comments  General comments (skin integrity, edema, etc.) VSS  General Exercises - Lower Extremity  Quad Sets  (long sitting)  OT - End of Session  Equipment Utilized During Treatment Gait belt;Rolling walker  Activity Tolerance Patient tolerated treatment well  Patient left in chair;with call bell/phone within reach;with chair alarm set (with SLP arrival for session)  Nurse Communication Mobility status;Precautions  OT Assessment/Plan  OT Plan Discharge plan remains appropriate  OT Visit Diagnosis Muscle weakness (generalized) (M62.81);Other abnormalities of gait and mobility (R26.89);Other symptoms and signs involving cognitive function;Cognitive communication deficit (R41.841)  OT Frequency (ACUTE ONLY) Min 2X/week  Recommendations for Other Services Rehab consult;Other (comment)  Follow Up Recommendations CIR  OT Equipment Wheelchair (measurements OT);Wheelchair cushion (measurements OT);Hospital bed;3 in 1 bedside commode  AM-PAC OT "6 Clicks" Daily Activity Outcome Measure (Version 2)  Help from another person eating meals? 2  Help from another person taking care of personal grooming? 2  Help from another person toileting, which includes using toliet, bedpan, or urinal? 2  Help from another person bathing (including washing, rinsing, drying)? 2  Help from another person to put on and taking off regular upper body clothing? 2  Help from another person to put on and taking off regular lower body clothing? 2  6 Click Score 12  OT Goal Progression  Progress towards OT goals Progressing  toward goals  Acute Rehab OT Goals  Patient Stated Goal unable to state  OT Goal Formulation Patient unable to participate in goal setting  Time For Goal Achievement 05/12/20  Potential to Achieve Goals Good  ADL Goals  Pt Will Perform Grooming with mod assist;sitting  Pt/caregiver will Perform Home Exercise Program Increased strength;Increased ROM;Both right and left upper extremity;With minimal assist;With written HEP provided  Additional ADL Goal #1 Pt will follow 1 step commands with >75% accuracy during ADL/functional task.  Additional ADL Goal #2 Pt will maintain eyes open >5 min during functional task.  Additional ADL Goal #3 Pt will maintain sitting balance EOB >10 with min (A)  OT Time Calculation  OT Start Time (ACUTE ONLY) 0919  OT Stop Time (ACUTE ONLY) 1005  OT Time Calculation (min) 46 min  OT General Charges  $OT Visit 1 Visit  OT Treatments  $Self Care/Home Management  8-22 mins    Brynn, OTR/L  Acute Rehabilitation Services Pager: [phone] Office: [phone] .

## 2020-05-10 ENCOUNTER — Inpatient Hospital Stay (HOSPITAL_COMMUNITY): Payer: Managed Care, Other (non HMO)

## 2020-05-10 ENCOUNTER — Encounter (HOSPITAL_COMMUNITY): Payer: Self-pay | Admitting: Physical Medicine & Rehabilitation

## 2020-05-10 DIAGNOSIS — R1312 Dysphagia, oropharyngeal phase: Secondary | ICD-10-CM | POA: Diagnosis not present

## 2020-05-10 DIAGNOSIS — S069X3S Unspecified intracranial injury with loss of consciousness of 1 hour to 5 hours 59 minutes, sequela: Secondary | ICD-10-CM | POA: Diagnosis not present

## 2020-05-10 LAB — CBC
HCT: 38.9 % — ABNORMAL LOW (ref 39.0–52.0)
Hemoglobin: 12.3 g/dL — ABNORMAL LOW (ref 13.0–17.0)
MCH: 29.4 pg (ref 26.0–34.0)
MCHC: 31.6 g/dL (ref 30.0–36.0)
MCV: 92.8 fL (ref 80.0–100.0)
Platelets: 859 10*3/uL — ABNORMAL HIGH (ref 150–400)
RBC: 4.19 MIL/uL — ABNORMAL LOW (ref 4.22–5.81)
RDW: 15.2 % (ref 11.5–15.5)
WBC: 12.5 10*3/uL — ABNORMAL HIGH (ref 4.0–10.5)
nRBC: 0 % (ref 0.0–0.2)

## 2020-05-10 LAB — CREATININE, SERUM
Creatinine, Ser: 0.75 mg/dL (ref 0.61–1.24)
GFR, Estimated: >60 mL/min (ref >60)

## 2020-05-10 LAB — GLUCOSE, CAPILLARY
Glucose-Capillary: 108 mg/dL — ABNORMAL HIGH (ref 70–99)
Glucose-Capillary: 111 mg/dL — ABNORMAL HIGH (ref 70–99)
Glucose-Capillary: 118 mg/dL — ABNORMAL HIGH (ref 70–99)

## 2020-05-10 MED ORDER — PROPRANOLOL HCL 20 MG/5ML PO SOLN
20.0000 mg | Freq: Three times a day (TID) | ORAL | Status: DC
  Administered 2020-05-11 – 2020-05-23 (×35): 20 mg
  Filled 2020-05-10 (×38): qty 5

## 2020-05-10 MED ORDER — PROPRANOLOL HCL 20 MG/5ML PO SOLN
20.0000 mg | Freq: Three times a day (TID) | ORAL | Status: DC
  Administered 2020-05-10: 20 mg via ORAL
  Filled 2020-05-10 (×3): qty 5

## 2020-05-10 MED ORDER — QUETIAPINE FUMARATE 200 MG PO TABS
200.0000 mg | ORAL_TABLET | Freq: Every day | ORAL | Status: DC
  Administered 2020-05-10 – 2020-05-17 (×8): 200 mg
  Filled 2020-05-10 (×8): qty 1

## 2020-05-10 MED ORDER — OSMOLITE 1.5 CAL PO LIQD: 1000.0000 mL | ORAL | Status: DC

## 2020-05-10 MED ORDER — JEVITY 1.2 CAL PO LIQD
1000.0000 mL | ORAL | Status: DC
  Administered 2020-05-10: 1000 mL
  Filled 2020-05-10 (×2): qty 1000

## 2020-05-10 MED ORDER — QUETIAPINE FUMARATE 50 MG PO TABS
50.0000 mg | ORAL_TABLET | Freq: Two times a day (BID) | ORAL | Status: DC
  Administered 2020-05-11 – 2020-05-19 (×17): 50 mg
  Filled 2020-05-10 (×19): qty 1

## 2020-05-10 NOTE — Progress Notes (Signed)
Dr. Naaman Plummer informed that patient's spouse stated, "He has had diarrhea the whole time in the hospital."  New orders given to start Jevity tube feedings at 50 mL/hour and to stop Colace.

## 2020-05-10 NOTE — Progress Notes (Signed)
Dr. Naaman Plummer notified regarding patient being extremely agitated and almost pulling out PEG tube.  Also notified abdominal binder removed due to stool being all over it.  Dr. Naaman Plummer informed that RN had given patient 1 mg of Ativan and patient was still fighting with staff while cleaning up loose stool.  Patient had 3 loose stools within 2 hours of giving bolus feedings.  Dr. Naaman Plummer stated, "Lets put patient back in regular bed with wrist restraints, stop bolus feedings and hook back up continuous."  New orders read back and confirmed with Dr. Naaman Plummer.
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
## 2020-05-10 NOTE — Progress Notes (Signed)
Patient resting with spouse present.  Patient in no distress at this time.  Physical needs given along with medication.
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
## 2020-05-10 NOTE — Progress Notes (Signed)
At 2006 Robaxin & Oxy IR 10mg 's given via PEG. At 2100, assisted patient to enclosure bed. Wife at bedside. Patient agitated pulling at iv site and pushing abdominal binder down and pulling at Peg tube. Difficult to redirect, yelling and biting at staff. Inappropriately grabbing at staff. Paged Dr. Naaman Plummer at 2215 R/T above info, one time order of 1mg  ativan  IM given at 2327. Slept for 2 hours. Intermittently restless after 0130, not as agitated as first part of shift. PRN oxy ir given at [phone]. Patrici Ranks A

## 2020-05-11 ENCOUNTER — Inpatient Hospital Stay (HOSPITAL_COMMUNITY): Payer: Managed Care, Other (non HMO)

## 2020-05-11 DIAGNOSIS — S069X3S Unspecified intracranial injury with loss of consciousness of 1 hour to 5 hours 59 minutes, sequela: Secondary | ICD-10-CM | POA: Diagnosis not present

## 2020-05-11 DIAGNOSIS — R1312 Dysphagia, oropharyngeal phase: Secondary | ICD-10-CM | POA: Diagnosis not present

## 2020-05-11 DIAGNOSIS — R195 Other fecal abnormalities: Secondary | ICD-10-CM | POA: Diagnosis not present

## 2020-05-11 DIAGNOSIS — S069X9A Unspecified intracranial injury with loss of consciousness of unspecified duration, initial encounter: Secondary | ICD-10-CM

## 2020-05-11 DIAGNOSIS — S82201S Unspecified fracture of shaft of right tibia, sequela: Secondary | ICD-10-CM | POA: Diagnosis not present

## 2020-05-11 LAB — GLUCOSE, CAPILLARY
Glucose-Capillary: 101 mg/dL — ABNORMAL HIGH (ref 70–99)
Glucose-Capillary: 101 mg/dL — ABNORMAL HIGH (ref 70–99)
Glucose-Capillary: 103 mg/dL — ABNORMAL HIGH (ref 70–99)
Glucose-Capillary: 104 mg/dL — ABNORMAL HIGH (ref 70–99)
Glucose-Capillary: 110 mg/dL — ABNORMAL HIGH (ref 70–99)
Glucose-Capillary: 114 mg/dL — ABNORMAL HIGH (ref 70–99)

## 2020-05-11 MED ORDER — JEVITY 1.2 CAL PO LIQD
1000.0000 mL | ORAL | Status: DC
  Administered 2020-05-11 – 2020-05-12 (×2): 1000 mL
  Filled 2020-05-11 (×3): qty 1000

## 2020-05-11 MED ORDER — ENOXAPARIN SODIUM 40 MG/0.4ML ~~LOC~~ SOLN
40.0000 mg | Freq: Two times a day (BID) | SUBCUTANEOUS | Status: DC
  Administered 2020-05-11 – 2020-05-28 (×34): 40 mg via SUBCUTANEOUS
  Filled 2020-05-11 (×33): qty 0.4
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
## 2020-05-11 NOTE — Progress Notes (Signed)
Pt resting comfortably this am. Later in morning pt awake, agitated, ripped tube feeding line in half, peg tube was pulled away for exit site and bleeding, pt c/o pain, abd binder was in place at that time. Throughout day pt continued to pull on PEG site pulling feeding out, biting wrist restraints, and removing abd binder. Wife at bedside and pt was better, but still pulling tube feeding and PEG tube. About 8099 pt began yelling and very difficult to redirect and when able to redirect, redirection lasted approximately 3 to 5 min.   Wife and minister at bedside.

## 2020-05-11 NOTE — Progress Notes (Signed)
VASCULAR LAB    Bilateral lower extremity venous duplex has been performed.  See CV proc for preliminary results.  Messaged results to Dr. Naaman Plummer via secure chat.  Zaley Talley, RVT 05/11/2020, 2:24 PM

## 2020-05-12 ENCOUNTER — Encounter (HOSPITAL_COMMUNITY): Payer: Self-pay | Admitting: Physical Medicine & Rehabilitation

## 2020-05-12 ENCOUNTER — Inpatient Hospital Stay (HOSPITAL_COMMUNITY): Payer: Managed Care, Other (non HMO) | Admitting: Speech Pathology

## 2020-05-12 ENCOUNTER — Inpatient Hospital Stay (HOSPITAL_COMMUNITY): Payer: Managed Care, Other (non HMO)

## 2020-05-12 ENCOUNTER — Inpatient Hospital Stay (HOSPITAL_COMMUNITY): Payer: Self-pay | Admitting: Occupational Therapy

## 2020-05-12 DIAGNOSIS — S069X3S Unspecified intracranial injury with loss of consciousness of 1 hour to 5 hours 59 minutes, sequela: Secondary | ICD-10-CM | POA: Diagnosis not present

## 2020-05-12 LAB — GLUCOSE, CAPILLARY
Glucose-Capillary: 116 mg/dL — ABNORMAL HIGH (ref 70–99)
Glucose-Capillary: 95 mg/dL (ref 70–99)
Glucose-Capillary: 98 mg/dL (ref 70–99)

## 2020-05-12 MED ORDER — LORAZEPAM 2 MG/ML IJ SOLN
INTRAMUSCULAR | Status: AC
  Administered 2020-05-12: 1 mg
  Filled 2020-05-12: qty 1

## 2020-05-12 MED ORDER — LORAZEPAM 1 MG PO TABS: 1.0000 mg | ORAL_TABLET | Freq: Once | ORAL | Status: AC

## 2020-05-12 MED ORDER — JEVITY 1.2 CAL PO LIQD
1000.0000 mL | ORAL | Status: DC
  Administered 2020-05-12: 1000 mL
  Filled 2020-05-12 (×9): qty 1000

## 2020-05-12 MED ORDER — LORAZEPAM 2 MG/ML IJ SOLN: 1.0000 mg | Freq: Once | INTRAMUSCULAR | Status: AC
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
## 2020-05-12 NOTE — Care Management (Signed)
[facility] Individual Statement of Services  Patient Name:  ASON HESLIN  Date:  05/12/2020  Welcome to the [facility].  Our goal is to provide you with an individualized program based on your diagnosis and situation, designed to meet your specific needs.  With this comprehensive rehabilitation program, you will be expected to participate in at least 3 hours of rehabilitation therapies Monday-Friday, with modified therapy programming on the weekends.  Your rehabilitation program will include the following services:  Physical Therapy (PT), Occupational Therapy (OT), Speech Therapy (ST), 24 hour per day rehabilitation nursing, Therapeutic Recreaction (TR), Psychology, Neuropsychology, Care Coordinator, Rehabilitation Medicine, Nutrition Services, Pharmacy Services and Other  Weekly team conferences will be held on Tuesdays to discuss your progress.  Your Inpatient Rehabilitation Care Coordinator will talk with you frequently to get your input and to update you on team discussions.  Team conferences with you and your family in attendance may also be held.  Expected length of stay: 21-28 days  Overall anticipated outcome: Supervision  Depending on your progress and recovery, your program may change. Your Inpatient Rehabilitation Care Coordinator will coordinate services and will keep you informed of any changes. Your Inpatient Rehabilitation Care Coordinator's name and contact numbers are listed  below.  The following services may also be recommended but are not provided by the [facility] will be made to provide these services after discharge if needed.  Arrangements include referral to agencies that provide these services.  Your insurance has been verified to be:  [place]  Your primary  doctor is:  No PCP   Pertinent information will be shared with your doctor and your insurance company.  Inpatient Rehabilitation Care Coordinator:  Cathleen Corti [phone] or [phone]  Information discussed with and copy given to patient by: Rana Snare, 05/12/2020, 1:36 PM

## 2020-05-12 NOTE — Progress Notes (Signed)
Patient ID: Adam Bates, male   DOB: 1970/12/27, 50 y.o.   MRN: [id number]  SW did not complete assessment with pt due to mild confusion. SW called pt wife [name] [phone]) to introduce self, explain role, discuss discharge process, inform on ELOS 21-28 days, and complete assessment. Wife is overwhelmed with current situation. She currently home schools their four children Dtrs- 69, 73, 67 y.o; son- 29 y.o) and intends to look for employment. In terms of planning pt care needs, she is worried on what pt care needs will be. SW did inform her on planning for 24/7 care, and whether this care will be due to physical or cognition is to be determined, and more information will be provided closer to discharge. Wife was informed when family education is scheduled a week before discharge, there will be a better understanding of what pt care needs will be. She did report concerns about pt agitation and if he will continue to have these behaviors. SW made effort to explain TBI and how behaviors vary according to each patient. SW did assure wife that pt would not be discharged until he was medically cleared by the physician. Wife would like follow-up from a medical provider to have a better understanding of pt care needs as she plans for his discharge. Amount of support at discharge is to be determined. SW to follow-up tomorrow with updates after team conference.   [name], MSW, [place] Office: [phone] Cell: [phone] Fax: [phone]  [name], MSW, [place] Office: [phone] Cell: [phone] Fax: [phone]
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
## 2020-05-12 NOTE — Progress Notes (Signed)
Speech Language Pathology Daily Session Note  Patient Details  Name: Adam Bates MRN: [id number] Date of Birth: 1970/10/28  Today's Date: 05/12/2020 SLP Individual Time: 4010-2725 SLP Individual Time Calculation (min): 30 min and Today's Date: 05/12/2020 SLP Co-Treatment Time: 3664-4034 (CO-TX with OT (SD) 1430-1500) SLP Co-Treatment Time Calculation (min): 15 min  Short Term Goals: Week 1: SLP Short Term Goal 1 (Week 1): Pt will demonstrate focused attention for 30 seconds with mod A multimodal cues in 75% of opportunities SLP Short Term Goal 2 (Week 1): Patient will follow 1-step commands during tasks in 75% of opportunities with Mod A multimodal cues. SLP Short Term Goal 3 (Week 1): Pt will answer simple yes/no questions in 75% opportunities with mod A multimodal cues SLP Short Term Goal 4 (Week 1): Pt will tolerate trials of Dys 1 solids, ice chips and thin liquids with Mod A for use of trained swallow strategies and minimal s/s aspiration, penetration.  Skilled Therapeutic Interventions: Skilled treatment session focused on cognitive and dysphagia goals. Upon arrival, patient was awake while upright in bed. Patient was only oriented to person, therefore, SLP provided external aids to assist with orientation to time and place that patient was able to utilize with max-total A. Patient's tube feed had been leaking and patient required Mod A verbal cues for initiation of removal of shirt and to donn a new one as well as Max A multimodal cues to donn pants. Patient performed oral care via the suction toothbrush with Max verbal and demonstration cues prior to trials. Patient consumed trials of ice chips and thin liquids via tsp and cup without overt s/s of aspiration and Mod A verbal cues for use of swallowing compensatory strategies. Recommend ongoing trials with SLP. Patient with severe language of confusion and sexually inappropriate language that was easily redirected. Patient left upright in  bed with alarm on, restraints in place and all needs within reach. Continue with current plan of care.       Pain No reports of pain   Therapy/Group: Individual Therapy  Adam Bates 05/12/2020, 3:06 PM

## 2020-05-12 NOTE — Progress Notes (Addendum)
Walked by pt room noticing pt visitor was not wearing mask. Reinforced visitor/masking policy. Visitor placed mask. Visitor hesitant to sign but was compliant. This nurse also signed policy. Reminded visitor if she has concerns, charge nurse is available for all needs/questions. Visitor had no further questions. Assigned nurse and charge nurse notified. Sheela Stack, LPN

## 2020-05-12 NOTE — Progress Notes (Signed)
Inpatient Rehabilitation Care Coordinator Assessment and Plan Patient Details  Name: Adam Bates MRN: [id number] Date of Birth: 31-Dec-1970  Today's Date: 05/12/2020  Hospital Problems: Principal Problem:   Traumatic brain injury [facility]) Active Problems:   Dysphagia, oropharyngeal phase  Past Medical History:  Past Medical History:  Diagnosis Date  . ADHD   . Attention deficit disorder   . Depression   . Head injury   . TBI (traumatic brain injury) ([place]) 1999   Past Surgical History:  Past Surgical History:  Procedure Laterality Date  . BRAIN SURGERY    . FEMUR IM NAIL Right 04/18/2020   Procedure: INTRAMEDULLARY (IM) NAIL FEMORAL;  Surgeon: Shona Needles, MD;  Location: [place];  Service: Orthopedics;  Laterality: Right;  . LAPAROTOMY N/A 04/17/2020   Procedure: EXPLORATORY LAPAROTOMY with repair of small bowel mesentery laceration x2;  Surgeon: Greer Pickerel, MD;  Location: [place];  Service: General;  Laterality: N/A;  . PEG PLACEMENT N/A 04/25/2020   Procedure: PERCUTANEOUS ENDOSCOPIC GASTROSTOMY (PEG) PLACEMENT;  Surgeon: Jesusita Oka, MD;  Location: [place];  Service: General;  Laterality: N/A;  . SHOULDER SURGERY    . TIBIA IM NAIL INSERTION Right 04/18/2020   Procedure: INTRAMEDULLARY (IM) NAIL TIBIAL;  Surgeon: Shona Needles, MD;  Location: [place];  Service: Orthopedics;  Laterality: Right;  . TRACHEOSTOMY TUBE PLACEMENT N/A 04/25/2020   Procedure: TRACHEOSTOMY;  Surgeon: Jesusita Oka, MD;  Location: [place];  Service: General;  Laterality: N/A;   Social History:  reports that he has never smoked. He has never used smokeless tobacco. He reports that he does not drink alcohol and does not use drugs.  Family / Support Systems Marital Status: Married How Long?: 19 years Patient Roles: Spouse,Parent Spouse/Significant Other: wife [name] [phone]) Children: 4 children: dtrs are 55, 25, 75 y.o; 60 y.o. son Other Supports: to be determined Anticipated  Caregiver: wife Ability/Limitations of Caregiver: Wife currently home schools their 4 children, and has been looking for employment as she had not been working since the children were born. Caregiver Availability: 24/7 Family Dynamics: Pt is primary financial support for family.  Social History Preferred language: English Religion: Christian Cultural Background: Pt has been working as an Tree surgeon Education: college grad Read: Yes Write: Yes Employment Status: Employed Name of Employer: [employer] (3xs per week) and [employer] 1 day per week Length of Employment: 1 (year) Return to Work Plans: TBD Public relations account executive Issues: None reported Guardian/Conservator: N/A   Abuse/Neglect Abuse/Neglect Assessment Can Be Completed: Unable to assess, patient is non-responsive or altered mental status  Emotional Status Pt's affect, behavior and adjustment status: Pt in good spirits at time of visit to room;however  presented with mild confusion at time of assessment. Recent Psychosocial Issues: Wife reports pt has been seeing Dr. Toy Care (psychiatrist) for years for depression in which he prescribes medications every 3 months. Psychiatric History: See above. Substance Abuse History: Wife reports rare etoh use. No other concerns reported.  Patient / Family Perceptions, Expectations & Goals Pt/Family understanding of illness & functional limitations: Pt wife has general understanding of pt care needs Premorbid pt/family roles/activities: Independent Anticipated changes in roles/activities/participation: Assistance with IADLs  Community Resources Express Scripts: None Premorbid Home Care/DME Agencies: None Transportation available at discharge: wife Resource referrals recommended: Other (Comment)  Discharge Planning Living Arrangements: Spouse/significant other,Children Support Systems: Spouse/significant other,Children Type of Residence: Private residence Insurance Resources: Multimedia programmer  (specify) Psychologist, counselling) Financial Resources: Employment Museum/gallery curator Screen Referred: No Living Expenses: Scientist, product/process development  Money Management: Patient Does the patient have any problems obtaining your medications?: No Home Management: Wife managed home care needs Patient/Family Preliminary Plans: Wife to take over finances Care Coordinator Barriers to Discharge: Decreased caregiver support,Lack of/limited family support Care Coordinator Barriers to Discharge Comments: Insurance Care Coordinator Anticipated Follow Up Needs: HH/OP Expected length of stay: 21-28 days  Clinical Impression SW completed assessment with pt wife. Pt not a veteran. No HCPOA in place. No DME. Wife will need a letter from attending physician to help with checking finances and insurance benefits. SW did inform on challenges associated with pt insurance.   Rana Snare 05/12/2020, 9:51 PM
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
## 2020-05-12 NOTE — Progress Notes (Signed)
Physical Therapy Session Note  Patient Details  Name: Adam Bates MRN: [id number] Date of Birth: 1970/10/01  Today's Date: 05/12/2020 PT Individual Time: 1100-1145 PT Individual Time Calculation (min): 45 min   Short Term Goals: Week 1:  PT Short Term Goal 1 (Week 1): Pt will perform sit to stand w/RW and adhere to wbing precautions w/min assist PT Short Term Goal 2 (Week 1): stand pivot bed to/from wc w/RW and min assist adhering to wbing precautions PT Short Term Goal 3 (Week 1): pt will tolerate initiation of gait training PT Short Term Goal 4 (Week 1): Pt will recall route to  room from primary utilized treatment area w/minimal cueing required.  Skilled Therapeutic Interventions/Progress Updates:     Patient in TIS w/c at the nurses station asking to leave and demonstrating behaviors consistent with over stimulation upon PT arrival. Patient alert and agreeable to PT session. Returned patient to his room with the door shut and only dim lighting and patient able to maintain a calm demeanor with therapist throughout session. Patient indicated R lower extremity pain during session by facial grimacing and stating "my leg hurts," RN made aware. PT provided repositioning, rest breaks, and distraction as pain interventions throughout session.   Patient presented with poor frustration tolerance, perseveration and repetition of phrases stated by therapist, intermittent inappropriate comments with some demonstration of awareness and self-correcting behaviors, and poor motor planning and safety awareness. Patient did not recall weight bearing precautions and was unable to maintain them with cuing and facilitation throughout session.   Therapeutic Activity: Bed Mobility: Patient performed sit to supine with min a for R lower extremity. Provided verbal cues for safety awareness with mobility as patient attempting to swing his R leg quickly to bring it onto the bed. Transfers: Patient performed stand  pivot TIS w/c<>toilet and TIS>bed with min-mod A and max A x1 to prevent patient from ambulating from the bathroom while not maintaining his weight bearing precautions. Patient unable to maintain precautions following cues and with PT placing a foot under his to reduce weight bearing with all transfers. Patient demonstrated poor motor planning with toilet transfers using a grab bar requiring heavy cues in a calm voice and increased time to locate and plan sitting on the [facility]. On returning the to [facility] w/c patient turned to step toward the bathroom door to walk out of the bathroom. Patient expressed increased R leg pain and frustration, able to redirect patient with cues for pausing and performing diaphragmatic breathing, patient then able to follow cues with facilitation to return to sitting in the TIS w/c.  Patient had a large BM during toileting. Required mod A for thoroughness for peri-care and mod A for lower body clothing management. Patient expressed frustration and apologized for having a BM throughout. PT provided orientation, reassurance, and education on benefits of bowl motility and progress with toilet transfers and self awareness, as patient requested to go to the bathroom appropriately during session. Patient receptive to education, will need reinforcement and continued orientation due to confused language and intermittent inappropriate comments throughout discussion.   Patient in bed with B wrist restraints in place and HOB >30 degrees at end of session with breaks locked, bed alarm set, and all needs within reach.    Therapy Documentation Precautions:  Precautions Precautions: Fall Precaution Comments: PEG TUBE/ABD BINDER TO PROTECT Other Brace: prevalon boot  for wound on pt's R heel Restrictions Weight Bearing Restrictions: Yes RLE Weight Bearing: Non weight bearing   Therapy/Group:  Individual Therapy  Shaheim Mahar L Sabian Kuba 05/12/2020, 12:56 PM

## 2020-05-12 NOTE — Progress Notes (Signed)
Occupational Therapy Session Note  Patient Details  Name: Adam Bates MRN: [id number] Date of Birth: November 13, 1970  Today's Date: 05/12/2020 OT Co-Treatment Time: 1430-1500 OT Co-Treatment Time Calculation (min): 30 min   Short Term Goals: Week 1:  OT Short Term Goal 1 (Week 1): Pt will complete LB dressing with mod assist. OT Short Term Goal 2 (Week 1): Pt will follow 1-step commands during a self-care task with 50% accuracy. OT Short Term Goal 3 (Week 1): Pt will complete functional transfers with mod assist OT Short Term Goal 4 (Week 1): Pt will complete bathing with mod assist.  Skilled Therapeutic Interventions/Progress Updates:    Pt seen with SLP for session. OT focus on initiation, sequencing, and motor planning of dressing tasks. Pt with language of confusion, disorientation, and sexually inappropriate with staff throughout session but he was able to be redirected to task with frequent cueing. Feeding tube incorrectly blocked and pt with soiled shirt. He required manual facilitation for initiation of all tasks and for perseveration. Pt completed bed mobility to EOB with mod A. He required manual assist to initiate doffing of shirt with mod A overall to don/doff new shirt. Pants donned with max A. Pt was able to stand from EOB with +2 mod A overall. OT placed foot under his RLE to ensure adherence for NWB precautions. Poor adherence overall. Pt returned to supine with min A. He was left supine with all needs met. B wrist restraints on, telesitter in room, peg tube ace wrapped for protection, and bed alarm on.   Therapy Documentation Precautions:  Precautions Precautions: Fall Precaution Comments: PEG TUBE/ABD BINDER TO PROTECT Other Brace: prevalon boot  for wound on pt's R heel Restrictions Weight Bearing Restrictions: Yes RLE Weight Bearing: Non weight bearing   Therapy/Group: Individual Therapy  Curtis Sites 05/12/2020, 3:13 PM

## 2020-05-12 NOTE — Progress Notes (Signed)
Inpatient Rehabilitation  Patient information reviewed and entered into eRehab system by Makaleigh Reinard M. Ronelle Smallman, M.A., CCC/SLP, PPS Coordinator.  Information including medical coding, functional ability and quality indicators will be reviewed and updated through discharge.    

## 2020-05-13 ENCOUNTER — Inpatient Hospital Stay (HOSPITAL_COMMUNITY): Payer: Managed Care, Other (non HMO)

## 2020-05-13 ENCOUNTER — Inpatient Hospital Stay (HOSPITAL_COMMUNITY): Payer: Managed Care, Other (non HMO) | Admitting: Speech Pathology

## 2020-05-13 DIAGNOSIS — S82201S Unspecified fracture of shaft of right tibia, sequela: Secondary | ICD-10-CM | POA: Diagnosis not present

## 2020-05-13 DIAGNOSIS — S069X3S Unspecified intracranial injury with loss of consciousness of 1 hour to 5 hours 59 minutes, sequela: Secondary | ICD-10-CM | POA: Diagnosis not present

## 2020-05-13 DIAGNOSIS — R1312 Dysphagia, oropharyngeal phase: Secondary | ICD-10-CM | POA: Diagnosis not present

## 2020-05-13 DIAGNOSIS — S82401S Unspecified fracture of shaft of right fibula, sequela: Secondary | ICD-10-CM | POA: Diagnosis not present

## 2020-05-13 MED ORDER — VALPROIC ACID 250 MG/5ML PO SOLN
250.0000 mg | Freq: Two times a day (BID) | ORAL | Status: DC
  Administered 2020-05-13 – 2020-05-15 (×5): 250 mg
  Filled 2020-05-13 (×5): qty 5

## 2020-05-13 MED ORDER — AMPHETAMINE-DEXTROAMPHETAMINE 10 MG PO TABS
15.0000 mg | ORAL_TABLET | Freq: Two times a day (BID) | ORAL | Status: DC
  Administered 2020-05-13 – 2020-05-16 (×4): 15 mg
  Filled 2020-05-13 (×5): qty 2

## 2020-05-13 NOTE — Plan of Care (Signed)
  Problem: RH Problem Solving Goal: LTG Patient will demonstrate problem solving for (SLP) Description: LTG:  Patient will demonstrate problem solving for basic/complex daily situations with cues  (SLP) Flowsheets (Taken 05/13/2020 0626) LTG: Patient will demonstrate problem solving for (SLP): Complex daily situations LTG Patient will demonstrate problem solving for: Minimal Assistance - Patient > 75% Note: Goal added 1/11   Problem: RH Memory Goal: LTG Patient will use memory compensatory aids to (SLP) Description: LTG:  Patient will use memory compensatory aids to recall biographical/new, daily complex information with cues (SLP) Flowsheets (Taken 05/13/2020 0626) LTG: Patient will use memory compensatory aids to (SLP): Minimal Assistance - Patient > 75% Note: Goal added 1/11   Problem: RH Awareness Goal: LTG: Patient will demonstrate awareness during functional activites type of (SLP) Description: LTG: Patient will demonstrate awareness during functional activites type of (SLP) Flowsheets (Taken 05/13/2020 [phone]) Patient will demonstrate during cognitive/linguistic activities awareness type of: Emergent LTG: Patient will demonstrate awareness during cognitive/linguistic activities with assistance of (SLP): Minimal Assistance - Patient > 75% Note: Goal added 1/11

## 2020-05-13 NOTE — Progress Notes (Signed)
Speech Language Pathology Daily Session Note  Patient Details  Name: Adam Bates MRN: [id number] Date of Birth: July 09, 1970  Today's Date: 05/13/2020 SLP Individual Time: 1100-1140 SLP Individual Time Calculation (min): 40 min  Short Term Goals: Week 1: SLP Short Term Goal 1 (Week 1): Pt will demonstrate focused attention for 30 seconds with mod A multimodal cues in 75% of opportunities SLP Short Term Goal 2 (Week 1): Patient will follow 1-step commands during tasks in 75% of opportunities with Mod A multimodal cues. SLP Short Term Goal 3 (Week 1): Pt will answer simple yes/no questions in 75% opportunities with mod A multimodal cues SLP Short Term Goal 4 (Week 1): Pt will tolerate trials of Dys 1 solids, ice chips and thin liquids with Mod A for use of trained swallow strategies and minimal s/s aspiration, penetration.  Skilled Therapeutic Interventions: Skilled treatment session focused on cognitive goals. Upon arrival, patient was awake in bed with his brother present (patient able to introduce SLP appropriately). Patient did not have any clothes on with the exception of his brief. SLP facilitated session by providing extra time and overall Min-Mod A verbal cues for problem solving and sequencing while donning his pants and his shirt. Patient performed oral care via the suction toothbrush after demonstration cues and Max verbal cues for thoroughness. Patient requested to use the bathroom and transferred to the commode with +2 assist for safety and overall Max verbal cues for problem solving. Patient was continent of bowel. During transfer back to bed, patient became tearful and reported he did not feel well and didn't know what to do. SLP provided emotional support. Overall, patient with decreased language of confusion today but increased overall restlessness. Patient left supine in bed with restraints in place, all needs within reach and music playing in hopes of maximizing relaxation. Continue  with current plan of care.      Pain No/Denies Pain   Therapy/Group: Individual Therapy  Cristel Rail 05/13/2020, 11:48 AM
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
## 2020-05-13 NOTE — Progress Notes (Addendum)
Patient ID: Adam Bates, male   DOB: 1970/08/12, 50 y.o.   MRN: [id number]  SW left message for pt wife Adam Bates [phone]) to provide updates from team conference; SW informed on ELOS being 4 weeks and will continue to provide updates each week, and physician request to follow-up was made. SW encouraged follow-up if needed.    *SW saw pt wife in pt room later that evening. She spoke with attending and aware on 4 weeks ELOS. SW informed will continue to update.   [name], MSW, [place] Office: [phone] Cell: [phone] Fax: [phone]

## 2020-05-13 NOTE — Progress Notes (Signed)
Physical Therapy Session Note  Patient Details  Name: Adam Bates MRN: [id number] Date of Birth: 09-15-1970  Today's Date: 05/13/2020 PT Individual Time: 0800-0910 PT Individual Time Calculation (min): 70 min   Short Term Goals: Week 1:  PT Short Term Goal 1 (Week 1): Pt will perform sit to stand w/RW and adhere to wbing precautions w/min assist PT Short Term Goal 2 (Week 1): stand pivot bed to/from wc w/RW and min assist adhering to wbing precautions PT Short Term Goal 3 (Week 1): pt will tolerate initiation of gait training PT Short Term Goal 4 (Week 1): Pt will recall route to  room from primary utilized treatment area w/minimal cueing required.  Skilled Therapeutic Interventions/Progress Updates:     Patient in bed asleep upon PT arrival. Patient slow to arouse and agreeable to PT session. Patient indicated R lower extremity pain with grimacing and crying out with weight bearing during session, RN made aware. PT provided repositioning, rest breaks, and distraction as pain interventions throughout session.   Patient lethargic at beginning of session, required significant time to arouse and initiate bed mobility and initial transfer. Continues to present with language of confusion, intermittent inappropriate responses, otherwise more appropriate this session Responded to acknowledgement of inappropriate comments well, recognizing that he should not make these comments in this context. Demonstrated continued motor planning deficits, responding better to demonstration and clear instructions of what to do prior to performing a task. Patient with emotional lability this session, crying spontaneously and perseverating on "this is all so hard." Also, perseverative with the phrase "I love you," in place of otherwise appropriate words for appreciation.   Therapeutic Activity: Bed Mobility: Donned pants bed level with total A due to increased lethargy this morning. Patient performed rolling R/L  with mod-min A with increased time and cues for initiation and motor planning. Patient performed supine to sit with max progressing to mod A for trunk and lower extremity management, required 3 trials due to lethargy and motor planning deficits. Performed sit to supine with supervision using B upper extremities to bring his R leg onto the bed, demonstrated increased control and safety awareness with mobility today. Provided verbal cues for sequencing and initiation throughout. Donned shirt sitting EOB with min-mod A, therapist initiated activity and patient automatically completed the task. Performed sitting balance with supervision. Patient requested to go to the bathroom appropriately, prior to leaving the room.  Transfers: Patient performed stand pivot bed<>TIS w/c and TIS w/c<>toilet using RW with min-mod A, increased assist for turning due to motor planning and facilitating decreased weight bearing on R lower extremity. Provided verbal cues for sequencing, hand placement, initiation, and maintaining weight bearing precautions. Patient with improved awareness of R lower extremity with reduced weight bearing this session.  Patient was continent of bowl and bladder during toileting, provided cues for attention to task and safety awareness, as patient leans far R to perform peri-care. Performed peri-care with min A for thoroughness and lower body dressing with mod A, required cues for holding on to the RW or grab bar for balance instead of pulling pants up with both hands due to significant imbalance and inability to comply with weight bearing precautions on R lower extremity.   Wheelchair Mobility:  Patient was transported in the [place] w/c with total A to/from the main therapy gym during session for energy conservation and time management. Applied towel rolls to pad B leg rests with patient in the gym for environmental stimulation tolerance. Patient demonstrated signs of  overstimulation after ~5 min in the gym.  Behaviors resolved once returned to his room.    Patient in bed with Telesitter in the room and B wrist restraints secured with quick release knot for safety at end of session with breaks locked, bed alarm set, and all needs within reach. Tube feed held and disconnected by RN during session, informed of patient's return to the room. MD rounded during session and informed of patient's progress and challenges with patient compliance with weight bearing precautions limiting mobility at this time.     Therapy Documentation Precautions:  Precautions Precautions: Fall Precaution Comments: PEG TUBE/ABD BINDER TO PROTECT Other Brace: prevalon boot  for wound on pt's R heel Restrictions Weight Bearing Restrictions: Yes RLE Weight Bearing: Touchdown weight bearing   Therapy/Group: Individual Therapy  Cherie L Grunenberg PT, DPT  05/13/2020, 12:30 PM

## 2020-05-13 NOTE — Plan of Care (Signed)
  Problem: RH Swallowing Goal: LTG Pt will demonstrate functional change in swallow as evidenced by bedside/clinical objective assessment (SLP) Description: LTG: Patient will demonstrate functional change in swallow as evidenced by bedside/clinical objective assessment (SLP) Outcome: Not Applicable   Problem: RH Comprehension Communication Goal: LTG Patient will comprehend basic/complex auditory (SLP) Description: LTG: Patient will comprehend basic/complex auditory information with cues (SLP). Outcome: Not Applicable Note: Goal discharged   Problem: RH Expression Communication Goal: LTG Patient will express needs/wants via multi-modal(SLP) Description: LTG:  Patient will express needs/wants via multi-modal communication (gestures/written, etc) with cues (SLP) Outcome: Not Applicable Note: Goal discharged

## 2020-05-13 NOTE — Plan of Care (Addendum)
Behavioral Plan: Minerva Areola  Rancho Level: VI  Behavior to decrease/ eliminate:  -Agitation -Sexually inappropriate with staff -Restlessness  Changes to environment:  -TV ok in daytime in moderation  -Soft rock music- on room computer via youtube  -back to bed or up in recliner after therapy/toileting - DO NOT LEAVE AT [place] -Patient prefers door closed, must have been or chair pad alarm on   Interventions: -Bed Alarm -High/Low bed -Mats on both sides of bed  -Chair Alarm -Sleep wake chart  Recommendations for interactions with patient: -Be mindful of voice volume, number of people in the room. Low stimulation is best  -Redirect when behavior is inappropriate. Be direct about inappropriateness of comments to promote awareness  -redirect inappropriate grabbing and comments, behavior is not intentional and due to TBI  Attendees:  Laverle Hobby, OT Weston Anna, SLP Apolinar Junes, PT Dorthula Nettles, RN

## 2020-05-13 NOTE — Progress Notes (Signed)
Occupational Therapy Session Note  Patient Details  Name: Adam Bates MRN: [id number] Date of Birth: Oct 31, 1970  Today's Date: 05/13/2020 OT Individual Time: 1430-1500 OT Individual Time Calculation (min): 30 min    Short Term Goals: Week 1:  OT Short Term Goal 1 (Week 1): Pt will complete LB dressing with mod assist. OT Short Term Goal 2 (Week 1): Pt will follow 1-step commands during a self-care task with 50% accuracy. OT Short Term Goal 3 (Week 1): Pt will complete functional transfers with mod assist OT Short Term Goal 4 (Week 1): Pt will complete bathing with mod assist.  Skilled Therapeutic Interventions/Progress Updates:    Pt received supine with no c/o pain, pleasantly confused but agreeable to OT session. Pt wrist restraints were removed and he transitioned to EOB with close supervision. Pt sat EOB and required redirection to task several times as he made a few inappropriate comments to therapist. Attempted to increase awareness to comments with redirection. Verbal and tactile cue provided for adherence to RLE NWB status before and during transfer. It can be assumed that pt bore weight through his RLE as he cried out in pain as he sat down in the chair. He began screaming about his leg pain and was unable to be redirected. Transfer back to bed set up and pt was able to return to bed without further weightbearing. Pt was unable to consoled as he continued to cry in pain. RN called for pain medication and ice pack applied to pt's leg. Pt was able to be slightly calmed down and remainder of session terminated. Assisted RN in pain medication administration via peg. 15 min missed. Pt was left supine with B wrist restraints applied and bed alarm set, telesitter present.   Therapy Documentation Precautions:  Precautions Precautions: Fall Precaution Comments: PEG TUBE/ABD BINDER TO PROTECT Other Brace: prevalon boot  for wound on pt's R heel Restrictions Weight Bearing Restrictions:  Yes RLE Weight Bearing: Touchdown weight bearing   Therapy/Group: Individual Therapy  Curtis Sites 05/13/2020, 6:42 AM
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
## 2020-05-13 NOTE — Patient Care Conference (Signed)
Inpatient RehabilitationTeam Conference and Plan of Care Update Date: 05/13/2020   Time: 10:03 AM    Patient Name: Adam Bates      Medical Record Number: [id number]  Date of Birth: Sep 09, 1970 Sex: Male         Room/Bed: 4W15C/4W15C-01 Payor Info: Payor: [employer] / Plan: CIG[employer] MANAGED / Product Type: *No Product type* /    Admit Date/Time:  05/09/2020  6:43 PM  Primary Diagnosis:  Traumatic brain injury [facility])  Hospital Problems: Principal Problem:   Traumatic brain injury ([place]) Active Problems:   Dysphagia, oropharyngeal phase    Expected Discharge Date: Expected Discharge Date:  (4 weeks)  Team Members Present: Physician leading conference: Dr. Alger Simons Care Coodinator Present: [name], LCSWA;Gotham Raden Creig Hines, RN, BSN, [place] Nurse Present: Rayne Du, LPN PT Present: Apolinar Junes, PT OT Present: Laverle Hobby, OT SLP Present: Weston Anna, SLP PPS Coordinator present : Gunnar Fusi, SLP     Current Status/Progress Goal Weekly Team Focus  Bowel/Bladder   incontinent of b/b; LBM: 01/11  encourage toileting needs while awake  assist with toileting needs prn   Swallow/Nutrition/ Hydration   NPO with PEG, Min A for use of strategies with trials  Supervision with least restrictive diet  ongoing trials of thin and Dys. 1 textures with use of strategies   ADL's   mod A overall, poor attention, confusion, inappropriate language (sexual nature)  supervision  ADL training, cognition, balance, family education   Mobility   Mod A for bed mobility and transfers, patient in not compliant with R LE NWB precautions due to cognitive deficits, requires frequent redirection to attend to activities  Supervision overall  Functional mobility, adhearance to weight bearing precautions, strength/ROM, attention, orientation, safety awareness, activity tolerance, behavior modification, patient/caregiver education   Communication             Safety/Cognition/ Behavioral  Observations  Rancho Level IV: Max A  Min A  attention, orientation with use of visual aids, basic problem solving   Pain   no c/o pain  remain pain free  assess pain level QS and prn   Skin   3 incision RLE, OTA; healing trach site; deep pressure injury on R heel; peg on LUQ  remain free of new skin breakdown/infection  assess skin QS and prn     Discharge Planning:  Pt to d/c to home with his wife and their four teenage children. Additional supports to be determined. Pt would benefit from staying at hospital until appropriate for outpatient therapies at time of d/c due to insurance challenges with obtaining HH.   Team Discussion: Wife is overwhelmed, patient's brother is at the bedside today, patient is incontinent B/B, and he has been sticking his finger in his stoma site. Patient is the income provider and the wife home schools the 4 children.  Patient on target to meet rehab goals: Mod assist overall with min assist goals. Have to keep reminding to weight bearing precautions. Slow to get moving, partial weight bearing, min assist. Patient has language of confusion, sexually inappropriate but easily redirected. 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  *See Care Plan and progress notes for long and short-term goals.   Revisions to Treatment Plan:  Continue tele-sitter, and bilateral soft wrist restraints for safety.  Teaching Needs: Family education, timed toileting, wound care, dressing changes, weight bearing precautions  Current Barriers to Discharge: Decreased caregiver support, Home enviroment access/layout, Incontinence,  Wound care, Lack of/limited family support, Weight bearing restrictions, Medication compliance, Behavior and Nutritional means  Possible Resolutions to Barriers: Continue current medications, provide emotional support to patient and family.     Medical Summary Current Status: TBI with polytrauma, RLAS IV. pain  issues, PEG in place. sleep inconsistent  Barriers to Discharge: Medical stability;Behavior       Continued Need for Acute Rehabilitation Level of Care: The patient requires daily medical management by a physician with specialized training in physical medicine and rehabilitation for the following reasons: Direction of a multidisciplinary physical rehabilitation program to maximize functional independence : Yes Medical management of patient stability for increased activity during participation in an intensive rehabilitation regime.: Yes Analysis of laboratory values and/or radiology reports with any subsequent need for medication adjustment and/or medical intervention. : Yes   I attest that I was present, lead the team conference, and concur with the assessment and plan of the team.   Cristi Loron 05/13/2020, 3:37 PM

## 2020-05-13 NOTE — Progress Notes (Signed)
Patient screaming cuss words into the hall and biting at wrist restraints. Patient crying and not redirectable. Patient complaining of 10/10 pain on right leg.

## 2020-05-13 NOTE — Plan of Care (Signed)
Cognition goals added, min A overall   Problem: RH Memory Goal: LTG Patient will demonstrate ability for day to day recall/carry over during activities of daily living with assistance level (OT) Description: LTG:  Patient will demonstrate ability for day to day recall/carry over during activities of daily living with assistance level (OT). Flowsheets (Taken 05/13/2020 1008) LTG:  Patient will demonstrate ability for day to day recall/carry over during activities of daily living with assistance level (OT): Minimal Assistance - Patient > 75%   Problem: RH Attention Goal: LTG Patient will demonstrate this level of attention during functional activites (OT) Description: LTG:  Patient will demonstrate this level of attention during functional activites  (OT) Flowsheets (Taken 05/13/2020 1008) Patient will demonstrate this level of attention during functional activites: Sustained Patient will demonstrate above attention level in the following environment: Controlled LTG: Patient will demonstrate this level of attention during functional activites (OT): Minimal Assistance - Patient > 75%   Problem: RH Awareness Goal: LTG: Patient will demonstrate awareness during functional activites type of (OT) Description: LTG: Patient will demonstrate awareness during functional activites type of (OT) Flowsheets (Taken 05/13/2020 1008) Patient will demonstrate awareness during functional activites type of: Emergent LTG: Patient will demonstrate awareness during functional activites type of (OT): Minimal Assistance - Patient > 75%

## 2020-05-13 NOTE — Progress Notes (Signed)
Physical Therapy Session Note  Patient Details  Name: Adam Bates MRN: [id number] Date of Birth: 13-Jun-1970  Today's Date: 05/13/2020 PT Individual Time: 6144-3154 PT Individual Time Calculation (min): 41 min   Short Term Goals: Week 1:  PT Short Term Goal 1 (Week 1): Pt will perform sit to stand w/RW and adhere to wbing precautions w/min assist PT Short Term Goal 2 (Week 1): stand pivot bed to/from wc w/RW and min assist adhering to wbing precautions PT Short Term Goal 3 (Week 1): pt will tolerate initiation of gait training PT Short Term Goal 4 (Week 1): Pt will recall route to  room from primary utilized treatment area w/minimal cueing required.  Skilled Therapeutic Interventions/Progress Updates:      Pt received supine in bed with soft wrist restraints intact. No complaint of pain and agreeable to therapy. PT doffs wrist restraints and pt performs supine to sit with minA and cues on sequencing and positioning. PT educates pt on WB restrictions and pt verbalizes understanding. Pt performs squat pivot transfer from bed to [place] with minA. PT has foot placed under pt's R foot to monitor WB and pt puts very minimal weight through R leg. PT provides manual facilitation of positioning and trunk management during transfer. Same assist provided for transfer from tilt in space WC to mat table. Pt participates in sitting balance activities with cognitive overlay on edge of mat. Pt initially participates in Connect 4 game but does not understand objective of game, but can reach outside BOS to place game pieces in slots. PT provides max cueing in very direct manner for pt to place pieces in specific columns. Pt attempts to categorize letters and number according to color but has difficulty attending to task, and cannot follow command of "Hand me a yellow shape". Pt then performs activity involving retrieval of balls from basket on R side and rotating to place them in basket on L side. Pt attention to task  and performance improved but still requires consistent cueing to perform.   Squat pivot from mat>WC>bed with minA. Sit to supine with minA. Pt left supine with wrist restrains in place and alarm intact.  Therapy Documentation Precautions:  Precautions Precautions: Fall Precaution Comments: PEG TUBE/ABD BINDER TO PROTECT Other Brace: prevalon boot  for wound on pt's R heel Restrictions Weight Bearing Restrictions: Yes RLE Weight Bearing: Touchdown weight bearing    Therapy/Group: Individual Therapy  [name], PT, DPT 05/13/2020, 4:18 PM

## 2020-05-14 ENCOUNTER — Inpatient Hospital Stay (HOSPITAL_COMMUNITY): Payer: Managed Care, Other (non HMO)

## 2020-05-14 ENCOUNTER — Inpatient Hospital Stay (HOSPITAL_COMMUNITY): Payer: Managed Care, Other (non HMO) | Admitting: Speech Pathology

## 2020-05-14 DIAGNOSIS — S82201S Unspecified fracture of shaft of right tibia, sequela: Secondary | ICD-10-CM | POA: Diagnosis not present

## 2020-05-14 DIAGNOSIS — R1312 Dysphagia, oropharyngeal phase: Secondary | ICD-10-CM | POA: Diagnosis not present

## 2020-05-14 DIAGNOSIS — S069X3S Unspecified intracranial injury with loss of consciousness of 1 hour to 5 hours 59 minutes, sequela: Secondary | ICD-10-CM | POA: Diagnosis not present

## 2020-05-14 DIAGNOSIS — S82401S Unspecified fracture of shaft of right fibula, sequela: Secondary | ICD-10-CM | POA: Diagnosis not present

## 2020-05-14 MED ORDER — CHOLESTYRAMINE 4 G PO PACK
4.0000 g | PACK | Freq: Two times a day (BID) | ORAL | Status: DC
  Administered 2020-05-14 – 2020-05-23 (×19): 4 g
  Filled 2020-05-14 (×19): qty 1

## 2020-05-14 MED ORDER — LORAZEPAM 2 MG/ML IJ SOLN
1.0000 mg | Freq: Once | INTRAMUSCULAR | Status: AC
  Administered 2020-05-15: 1 mg via INTRAMUSCULAR
  Filled 2020-05-14: qty 1

## 2020-05-14 NOTE — Progress Notes (Signed)
RN assessed redness on pt left wrist. RN notified charge nurse and will notify PA on call. Pt in no acute distress. RN will continue to monitor.

## 2020-05-14 NOTE — Progress Notes (Signed)
Physical Therapy Session Note  Patient Details  Name: Adam Bates MRN: [id number] Date of Birth: 10/29/1970  Today's Date: 05/14/2020 PT Individual Time: 0800-0908 and 6160-7371 PT Individual Time Calculation (min): 35 min   Short Term Goals: Week 1:  PT Short Term Goal 1 (Week 1): Pt will perform sit to stand w/RW and adhere to wbing precautions w/min assist PT Short Term Goal 2 (Week 1): stand pivot bed to/from wc w/RW and min assist adhering to wbing precautions PT Short Term Goal 3 (Week 1): pt will tolerate initiation of gait training PT Short Term Goal 4 (Week 1): Pt will recall route to  room from primary utilized treatment area w/minimal cueing required.  Skilled Therapeutic Interventions/Progress Updates:     Session 1: Patient in bed with wrist restraints in place upon PT arrival. Patient alert and agreeable to PT session. Patient indicated R lower extremity pain with grimacing and crying out with mobility during session, RN made aware. PT provided repositioning, rest breaks, and distraction as pain interventions throughout session. Patient with decreased pain tolerance this morning compared to previous sessions, RN made aware.  Patient incontinent of bowl in the bed without incontinence brief donned. Per LPN, patient was agitated by donning brief and one could not be applied prior to session.   Therapeutic Activity: Bed Mobility: Patient performed rolling R/L x2 with mod A for R lower extremity management and hand over hand assist for reaching for bed rail. Doffed/donned incontinence brief and performed peri-care with total A bed level with increased time to redirect patient due to decreased attention to task and accomodate positioning for R lower extremity due to increased pain. He performed supine to sit with min A for trunk management and scooting forward with increased cues for initiation and sit to supine with min A for R lower extremity. Patient donned shorts sitting EOB  with max A due to increased R lower extremity pain.  Transfers: Patient performed sit to/from stand x1 with mod A of 1-2 for safety without AD due to patient impulsivity, with partial weight bearing on R foot despite cues. He performed squat pivot bed<>TIS w/c with mod +2 x1 and min +1 x1, increased assist due to poor safety awareness. Provided verbal cues for initiation, demonstration for NWB with facilitation with therapist's foot under the patients with improved compliance with weight bearing precautions.   Patient was transported in the [place] w/c with total A to/from the Day room during session for energy conservation and time management. Patient unable to recall activities performed during yesterday's PT session, from memory and with visual reminders of activities. Patient able to identify 4/5 letters, however, was instructed to name the color of the letter rather than the letter and continued to repeat the error despite direction.   Patient in bed with B soft wrist restraints in place, telesitter in room, at end of session with breaks locked, bed alarm set, and all needs within reach.   Session 2: Patient in bed with 2 NTs and RN in the room attempting to reapply wrist restraints. Patient actively resisting restraint placement and demonstrating agitated behaviors upon PT arrival. Attempted to redirect patient and provided cues to nursing staff for reducing stimulation in the room. Once wrist restraint secured, nursing staff left the room. Closed the door and turned off the lights and provided cues for patient to perform diaphragmatic breathing with improvement in agitation, however, patient extremely restless with language of confusion, asking to see a friend that works in the  hospital and using various acronyms to describe where he wanted to go. Patient able to calmly ask for therapist to remove his restraints so he could see in the hallway. Removed wrist restraints one at a time, then provided direct  simple cues for patient to come to sitting EOB with supervision with use of bed rail.   Patient donned socks and shorts with min A for R lower extremity management. Performed squat pivot transfers bed<>TIS w/c, as above with continued adherence to weight bearing precautions. Patient transported in [name] throughout unit, provided orientation to situation, location, and rehab unit. Patient with reduced restlessness and confusion after and agreeable to returning to the room and resting in the bed.   Patient in bed with B soft wrist restraints in place and telesitter in room when handed off to [place], SLP at end of session with breaks locked, bed alarm set, and all needs within reach. Patient in calmer mood and good spirits at end of session.    Therapy Documentation Precautions:  Precautions Precautions: Fall Precaution Comments: PEG TUBE/ABD BINDER TO PROTECT Other Brace: prevalon boot  for wound on pt's R heel Restrictions Weight Bearing Restrictions: Yes RLE Weight Bearing: Touchdown weight bearing   Therapy/Group: Individual Therapy  Shaka Zech L Irais Mottram PT, DPT  05/14/2020, 4:07 PM
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
## 2020-05-14 NOTE — Progress Notes (Signed)
Pt slept throughout the night from 1900pm-0630am. Pt had 2 incontinent voids. Pt in no acute distress RN will continue to monitor.

## 2020-05-14 NOTE — Progress Notes (Signed)
Patient chewed off right wrist restraint with teeth, pulled off brief. New wrist restraint applied. Patient kicked, yelled and scream at multiple staff. Telesitter tried talking to patient to get him calm and was not effective. Staff tried talking using soft voice with patient and was not effective. Patient grabbed staff member hand and had tight grip on it and tried to bite at staff hand. Patient also remains kicking and swinging at staff. Brief change attempted and also bed sheets changed. Linna Hoff, PA aware of situation. One time order of ativan 1mg  IM to be given. No further complications noted at this time.  Audie Clear, LPN

## 2020-05-14 NOTE — Progress Notes (Signed)
Speech Language Pathology Daily Session Note  Patient Details  Name: ARTIS BEGGS MRN: [id number] Date of Birth: May 03, 1971  Today's Date: 05/14/2020 SLP Individual Time: 1335-1415 SLP Individual Time Calculation (min): 40 min  Short Term Goals: Week 1: SLP Short Term Goal 1 (Week 1): Pt will demonstrate focused attention for 30 seconds with mod A multimodal cues in 75% of opportunities SLP Short Term Goal 2 (Week 1): Patient will follow 1-step commands during tasks in 75% of opportunities with Mod A multimodal cues. SLP Short Term Goal 3 (Week 1): Pt will answer simple yes/no questions in 75% opportunities with mod A multimodal cues SLP Short Term Goal 4 (Week 1): Pt will tolerate trials of Dys 1 solids, ice chips and thin liquids with Mod A for use of trained swallow strategies and minimal s/s aspiration, penetration.  Skilled Therapeutic Interventions: Skilled treatment session focused on cognitive and dysphagia goals. Upon arrival, patient finishing with PT and appeared calm. SLP facilitated session by providing Max demonstration and verbal cues for thoroughness with oral care via the suction toothbrush. Patient consumed trials of ice chips and thin liquids via spoon without overt s/s of aspiration. However, patient with intermittent oral holding of water with expectoration, patient reported it due to not liking the taste. MBS is planned for Friday to assess swallow function. SLP also facilitated session by providing passive orientation. Patient required Mod verbal cues for attention to trials with intermittent language of confusion and verbal perseveration noted. Patient also with intermittent confusion by attempting to put ice chips down his shorts but was easily redirected. Patient appeared lethargic at end of session, SLP provided music to maximize restfulness. Patient left semi-reclined in bed with restraints in place and all needs within reach. Continue with current plan of care.        Pain No/Denies Pain  Therapy/Group: Individual Therapy  Jaleena Viviani 05/14/2020, 3:00 PM

## 2020-05-14 NOTE — Progress Notes (Signed)
Occupational Therapy Session Note  Patient Details  Name: Adam Bates MRN: [id number] Date of Birth: 17-Sep-1970  Today's Date: 05/14/2020 OT Individual Time: 1000-1100 OT Individual Time Calculation (min): 60 min    Short Term Goals: Week 1:  OT Short Term Goal 1 (Week 1): Pt will complete LB dressing with mod assist. OT Short Term Goal 2 (Week 1): Pt will follow 1-step commands during a self-care task with 50% accuracy. OT Short Term Goal 3 (Week 1): Pt will complete functional transfers with mod assist OT Short Term Goal 4 (Week 1): Pt will complete bathing with mod assist.  Skilled Therapeutic Interventions/Progress Updates:     Pt received in bed agreeable to OT after increased time to arouse and RN to disconnect tube feed. Pt with pain in RLE with mobility. OT alerts RN to provide medication as able per [facility]. Pt completes squat pivot transfers with OT providing WB adherence with OT foot under pts. Pt often demo language of confusion and sexually inappropriate statements and genstures with redirection. Pt perseverative on having water and OT gently redirects.  ADL:   Pt completes UB dressing with total A to doff shirt and A to thread head as pt perseverating on putting over R foot. PT then able to automatically thread B arms and pull down trunk. Pt unable to apply deodorant at sink as pt attempting to apply it to legs. Needs HOH A to manage going under shirt and onto skin. Pt completes LB dressing with total A at bed level d/t restlessness and attempting to stand when putting shirt on.   Therapeutic activity Seated horse shoe toss for sustained attention and dynamic reaching Seated ball toss with pt able to count to 10 while tossing but unable to follow cues to switch to different type of toss (I.e. bounce or overhead)  Pt left at end of session in bed with exit alarm on, call light in reach and all needs met   Therapy Documentation Precautions:  Precautions Precautions:  Fall Precaution Comments: PEG TUBE/ABD BINDER TO PROTECT Other Brace: prevalon boot  for wound on pt's R heel Restrictions Weight Bearing Restrictions: Yes RLE Weight Bearing: Touchdown weight bearing General:   Vital Signs: Therapy Vitals Pulse Rate: 88 Resp: 20 BP: 100/78 Patient Position (if appropriate): Lying Oxygen Therapy SpO2: 98 % O2 Device: Room Air Pain:   ADL:   Vision   Perception    Praxis   Exercises:   Other Treatments:     Therapy/Group: Individual Therapy  Tonny Branch 05/14/2020, 6:47 AM
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
## 2020-05-15 ENCOUNTER — Inpatient Hospital Stay (HOSPITAL_COMMUNITY): Payer: Managed Care, Other (non HMO)

## 2020-05-15 DIAGNOSIS — R1312 Dysphagia, oropharyngeal phase: Secondary | ICD-10-CM | POA: Diagnosis not present

## 2020-05-15 DIAGNOSIS — S82401S Unspecified fracture of shaft of right fibula, sequela: Secondary | ICD-10-CM | POA: Diagnosis not present

## 2020-05-15 DIAGNOSIS — S82201S Unspecified fracture of shaft of right tibia, sequela: Secondary | ICD-10-CM | POA: Diagnosis not present

## 2020-05-15 DIAGNOSIS — S069X3S Unspecified intracranial injury with loss of consciousness of 1 hour to 5 hours 59 minutes, sequela: Secondary | ICD-10-CM | POA: Diagnosis not present

## 2020-05-15 MED ORDER — VALPROIC ACID 250 MG/5ML PO SOLN
500.0000 mg | Freq: Two times a day (BID) | ORAL | Status: DC
  Administered 2020-05-15 – 2020-05-20 (×10): 500 mg
  Filled 2020-05-15 (×10): qty 10

## 2020-05-15 NOTE — Progress Notes (Signed)
Arrived to patient room, patient taken off abdominal binder and pulled out peg tube. Patient still has on wrist restraints and was still able to get to tube. Took 5 staff members to try and re-orient patient to insert foley, patient continously grabbed at tube and tried to hit, kick and bite at staff. 18inch foley inserted into area with 30cc NS balloon. Placement checked and gurgle sounds noted in the belly. Foley in place and abdominal binder placed back on patient. One time dose of ativan given IM to anterior thigh. Wrist restraints re-applied. Patient is still agitated at this time. Will re-assess,./; No further complications noted at this time. Helyn Numbers, PA notified of situation and verbal order was received by PA for placement of foley STAT.  Audie Clear, LPN
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
## 2020-05-15 NOTE — Progress Notes (Signed)
Occupational Therapy Session Note  Patient Details  Name: Adam Bates MRN: [id number] Date of Birth: 10-Jul-1970  Today's Date: 05/15/2020 OT Individual Time: 1000-1100 OT Individual Time Calculation (min): 60 min    Short Term Goals: Week 1:  OT Short Term Goal 1 (Week 1): Pt will complete LB dressing with mod assist. OT Short Term Goal 2 (Week 1): Pt will follow 1-step commands during a self-care task with 50% accuracy. OT Short Term Goal 3 (Week 1): Pt will complete functional transfers with mod assist OT Short Term Goal 4 (Week 1): Pt will complete bathing with mod assist.  Skilled Therapeutic Interventions/Progress Updates:     Pt received in bed calm and much more receptive to mobility cues this date. Pt completes squat pivot transfers with good adherence ot WB precautions with OT foot on top of OTs with MIN A and +2 present for safety during all mobility.   Pt demo poor R visual attention requiring MAX VC for R scanning to locate card on R and place on board in front of patient.   Pt places washer on bolt and fastens nuts onto bolt with mod step by step cues to attend to task and locate pieces in R visual field. Pt continues with task calmly about 10 min with moderate perseveration on getting a drink of water.  UB dressing with supervision to change shirt however pt tries to put dirty shirt on top of new shirt.   Pt reports need to toilet. Pt with increasing agitation during toileting with pt unable to follow commands despite voiding bladder as pt stated he needed to into urinal seated on toilet. Pt becoming very restless and aggitated on toilet (OT encouraged pt to attempt to poop since he kept stating his stomach hurt). Pt stands impulsively ON BLE crying out in pain with OT quickly, forcibly sitting pt down onto toilet. Pt swats at OT arm yelling, "dont push me." OT calmly states, "dont hit me. You cannot stand on your R leg." after a silent pause, pt transfers back to w/c and  STS with grab bar and OT facilitating decreased WB under RLE. Pt still vocal about pain hurting with pt likely putting 15% body weight through LE while +2 pulls up patns. Pt returns to bed and is much calmer respotioned in the bed. Pt audibly passes gas a couple times and does not continue to complain of stomach pain when asked. Soft rock played in background and wrist retraints reapplied.    ABS score of 37 while on toilet  Pt left at end of session in bed with exit alarm on, call light in reach and all needs met     Therapy Documentation Precautions:  Precautions Precautions: Fall Precaution Comments: PEG TUBE/ABD BINDER TO PROTECT Other Brace: prevalon boot  for wound on pt's R heel Restrictions Weight Bearing Restrictions: Yes RLE Weight Bearing: Non weight bearing General:   Vital Signs: Therapy Vitals Temp: 98.2 F (36.8 C) Temp Source: Oral Pulse Rate: 90 Resp: 16 BP: 111/78 Patient Position (if appropriate): Lying Oxygen Therapy SpO2: 97 % O2 Device: Room Air Pain:   ADL:   Vision   Perception    Praxis   Exercises:   Other Treatments:     Therapy/Group: Individual Therapy  Tonny Branch 05/15/2020, 6:43 AM

## 2020-05-15 NOTE — Progress Notes (Signed)
Physical Therapy Session Note  Patient Details  Name: Adam Bates MRN: [id number] Date of Birth: 1970-06-02  Today's Date: 05/15/2020 PT Individual Time: 0800-0900 and 1435-1530 PT Individual Time Calculation (min): 60 min and 55 min   Short Term Goals: Week 1:  PT Short Term Goal 1 (Week 1): Pt will perform sit to stand w/RW and adhere to wbing precautions w/min assist PT Short Term Goal 2 (Week 1): stand pivot bed to/from wc w/RW and min assist adhering to wbing precautions PT Short Term Goal 3 (Week 1): pt will tolerate initiation of gait training PT Short Term Goal 4 (Week 1): Pt will recall route to  room from primary utilized treatment area w/minimal cueing required.  Skilled Therapeutic Interventions/Progress Updates:     Session 1: Patient in bed asleep with B soft wrist restraints secured upon PT arrival. Patient easy to arouse to verbal stimulation and agreeable to PT session. Patient denied pain at beginning of session, but displayed indicators of R lower extremity pain with mobility, grimacing and crying out.  Patient continues to demonstrate delayed initiation, language of confusion, perseveration on phrases with improved redirection this session, poor recall of information, and decreased pain/frustration tolerance. Patient remained calm throughout session with decreased frequency of inappropriate comments/behaviors this session.   Therapeutic Activity: Bed Mobility: Patient performed supine to/from sit with min A for R lower extremity management. Provided verbal cues for initiation and sequencing, required significant time and encouragement due to pain/fatigue. Donned shirt EOB with set-up assist and pants with mod A, performed lateral leans to bring pants over hips with max cues and facilitation for technique.  Transfers: Patient performed squat pivot bed<>TIS w/c and TIS w/c<>toilet using arm rest, bed rails, and grab bars with min A. Patient did not allow therapist to  place a foot under his R foot to prevent weight bearing this session, but did allow therapist to block his R foot far anterior to his L foot to reduce weight bearing during transfers. Patient was continent of bladder on toilet, but reporting stomach pain/discomfort. Performed peri-care with cues for appropriate use of toilet paper, and performed lower body clothing management with max A and cues for patient to allow therapist to assist and place hands on grab bar, as he performed sit to/from stand x1 to pull his pants up with B hands on the grab bar with R foot placed far ahead of L to discourage weight bearing due to poor compliance despite cues.   Patient perseverated on asking for something to drink, agreeable to oral care. Patient tolerated sitting in TIS w/c at the nurses station in a busy environment for supervision as PT retrieved items for oral care. Returned patient to his room and provided oral care with an oral sponge, performed mouth rinse following cues to not swallow rinse, however he would not follow cues to spit it back into the cup and only indicated he would spit in the trash can, trash can provided and patient spit rinse out. Applied oral moisturizer to lips due to dryness, and patient very appreciative after.    Neuromuscular Re-ed: Patient performed the following attention and basic dual task activities: -bass toss seated in TIS w/c wile counting by 1's out loud 2x20, required cues x2-3 to attend to counting, but without error in sequence each trial -patient was able to identify a beach ball and provide 2 uses for the ball with mod cues for problem solving  Patient in bed with B soft wrist restraints secured  and Telesitter in place at end of session with breaks locked, bed alarm set, and all needs within reach. RN disconnected tube feed for mobility during session and informed that patient was done with therapy after session. Patient did pull at his abdominal binder resulting in the  binder tugging on his PEG tube x3 during session, Tube adjusted to resting above his tube site and binder reapplied during session.   Session 2: Discussed patient with RN prior to initiating PT session due to events of significant patient agitation earlier. RN recommended that patient continue with therapies at this time to reduce restlessness and agitation.  Patient in in bed listening to soft rock upon PT arrival. Patient alert and agreeable to PT session. Patient denied pain during session. Patient's bed and clothes soiled due to urinary incontinence.   Maintained a low stimulation environment with lights off, door closed, and soft rock playing quietly. Maintained only this therapist in the room to reduce additional stimulation to avoid patient agitation. Patient remained calm and compliant in this environment. Continued to display language of confusion with increase in inappropriate comments/gestures and difficulty self-correcting or demonstrating awareness of behaviors this session.  Doffed patient's pants with total A bed level due to incontinence. Patient performed rolling R/L with min A for R lower extremity management and hand-over-hand assist for use of bed rail. Incontinence brief was changed, peri-care performed, and applied barrier cream due to increased redness/irritation of the perianal skin with total A bed level. Patient performed supine to/form sit, as above. Patient doffed/donned a shirt with set-up and cues for initiation and donned pants with min-mod A for R lower extremity management and pulling up over his hips with lateral leans to reduce R lower extremity weight bearing. He transferred to/from the TIS w/c, as above. Transported patient in the TIS w/c in the halls and to the ortho gym for increased sensory stimulation. Patient tolerated >10 min outside the room and sitting with other patient's therapists. He initiated appropriate greatings with 2/4 individuals and inappropriate  comments and gestures to the other two individuals. Performed PROM of R lower extremity with patient sitting in the gym, patient tolerated well. Demonstrated decreased hip and knee flexion and knee extension limited by pain and edema. Patient appropriately asked to leave the gym and agreeable to returning to the room. Patient returned to bed and provided cues for diaphragmatic breathing to reduce excitation from increased stimulation from being out of the room. Patient agreeable to having soft wrist restraints applied and educated on purpose of restraints for patient safety, will need further reinforcement due to poor recall of new information.   Patient calm in the bed with B soft wrist restraints secured, soft rock playing on monitor, and Telesitter in place at end of session with breaks locked, bed alarm set, and all needs within reach. Patient did not pull at abdominal binder or foley during this session.    Therapy Documentation Precautions:  Precautions Precautions: Fall Precaution Comments: PEG TUBE/ABD BINDER TO PROTECT Other Brace: prevalon boot  for wound on pt's R heel Restrictions Weight Bearing Restrictions: Yes RLE Weight Bearing: Non weight bearing   Therapy/Group: Individual Therapy  Myrle Wanek L Trayvion Embleton PT, DPT  05/15/2020, 12:06 PM

## 2020-05-15 NOTE — Progress Notes (Signed)
Pt was very agitated in beginning of shift. Pt slept throughout night with minimal interruptions. Pt in no acute distress. RN will continue to monitor.

## 2020-05-16 ENCOUNTER — Encounter (HOSPITAL_COMMUNITY): Payer: Managed Care, Other (non HMO) | Admitting: Occupational Therapy

## 2020-05-16 ENCOUNTER — Inpatient Hospital Stay (HOSPITAL_COMMUNITY): Payer: Managed Care, Other (non HMO)

## 2020-05-16 ENCOUNTER — Inpatient Hospital Stay (HOSPITAL_COMMUNITY): Payer: Managed Care, Other (non HMO) | Admitting: Occupational Therapy

## 2020-05-16 DIAGNOSIS — S069X3S Unspecified intracranial injury with loss of consciousness of 1 hour to 5 hours 59 minutes, sequela: Secondary | ICD-10-CM | POA: Diagnosis not present

## 2020-05-16 DIAGNOSIS — S82201S Unspecified fracture of shaft of right tibia, sequela: Secondary | ICD-10-CM | POA: Diagnosis not present

## 2020-05-16 DIAGNOSIS — S82401S Unspecified fracture of shaft of right fibula, sequela: Secondary | ICD-10-CM | POA: Diagnosis not present

## 2020-05-16 DIAGNOSIS — R1312 Dysphagia, oropharyngeal phase: Secondary | ICD-10-CM | POA: Diagnosis not present

## 2020-05-16 LAB — CBC
HCT: 38 % — ABNORMAL LOW (ref 39.0–52.0)
Hemoglobin: 12.3 g/dL — ABNORMAL LOW (ref 13.0–17.0)
MCH: 30.3 pg (ref 26.0–34.0)
MCHC: 32.4 g/dL (ref 30.0–36.0)
MCV: 93.6 fL (ref 80.0–100.0)
Platelets: 471 10*3/uL — ABNORMAL HIGH (ref 150–400)
RBC: 4.06 MIL/uL — ABNORMAL LOW (ref 4.22–5.81)
RDW: 14.8 % (ref 11.5–15.5)
WBC: 6.3 10*3/uL (ref 4.0–10.5)
nRBC: 0 % (ref 0.0–0.2)

## 2020-05-16 LAB — BASIC METABOLIC PANEL
Anion gap: 12 (ref 5–15)
BUN: 18 mg/dL (ref 6–20)
CO2: 29 mmol/L (ref 22–32)
Calcium: 8.7 mg/dL — ABNORMAL LOW (ref 8.9–10.3)
Chloride: 103 mmol/L (ref 98–111)
Creatinine, Ser: 0.81 mg/dL (ref 0.61–1.24)
GFR, Estimated: >60 mL/min (ref >60)
Glucose, Bld: 98 mg/dL (ref 70–99)
Potassium: 3.7 mmol/L (ref 3.5–5.1)
Sodium: 144 mmol/L (ref 135–145)

## 2020-05-16 MED ORDER — AMPHETAMINE-DEXTROAMPHETAMINE 10 MG PO TABS
10.0000 mg | ORAL_TABLET | Freq: Two times a day (BID) | ORAL | Status: DC
  Administered 2020-05-16 – 2020-05-19 (×6): 10 mg
  Filled 2020-05-16 (×6): qty 1

## 2020-05-16 MED ORDER — JEVITY 1.2 CAL PO LIQD
1000.0000 mL | ORAL | Status: DC
  Administered 2020-05-16 – 2020-05-22 (×7): 1000 mL
  Filled 2020-05-16 (×9): qty 1000
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
## 2020-05-16 NOTE — Progress Notes (Signed)
Speech Language Pathology Weekly Progress Note  Patient Details  Name: Adam Bates MRN: [id number] Date of Birth: [birth year]  Beginning of progress report period: May 09, 2020 End of progress report period: May 16, 2020  Short Term Goals: Week 1: SLP Short Term Goal 1 (Week 1): Pt will demonstrate focused attention for 30 seconds with mod A multimodal cues in 75% of opportunities SLP Short Term Goal 1 - Progress (Week 1): Met SLP Short Term Goal 2 (Week 1): Patient will follow 1-step commands during tasks in 75% of opportunities with Mod A multimodal cues. SLP Short Term Goal 2 - Progress (Week 1): Met SLP Short Term Goal 3 (Week 1): Pt will answer simple yes/no questions in 75% opportunities with mod A multimodal cues SLP Short Term Goal 3 - Progress (Week 1): Met SLP Short Term Goal 4 (Week 1): Pt will tolerate trials of Dys 1 solids, ice chips and thin liquids with Mod A for use of trained swallow strategies and minimal s/s aspiration, penetration. SLP Short Term Goal 4 - Progress (Week 1): Met    New Short Term Goals: Week 2: SLP Short Term Goal 1 (Week 2): Patient will consume current diet with minimal overt s/s of aspiration and overall Mod A verbal cues for use of swallowing compensatory strategies. SLP Short Term Goal 2 (Week 2): Patient will consume trials of thin liquids via cup without overt s/s of aspiration over 3 sessions with Mod verbal cues for use of swallowing strategies to assess readiness for repeat MBS. SLP Short Term Goal 3 (Week 2): Patient will demonstrate sustained attention to functional tasks for 10 minutes with Mod verbal cues for redirection. SLP Short Term Goal 4 (Week 2): Patient will utilize external aids for orientation to time, place and situation with Max verbal and visual cues. SLP Short Term Goal 5 (Week 2): Patient will demonstrate functional problem solving for basic and familiar tasks with Mod verbal cues.  Weekly Progress Updates: Patient  has made functional gains and has met 4 of 4 STGs this reporting period. Currently, patient demonstrates behaviors consistent with a Rancho Level IV-emerging V and requires overall Max A multimodal cues to complete functional and familiar tasks safely. Patient continues to demonstrate restlessness, language of confusion, and intermittent agitation. Patient had an MBS today which showed silent aspiration of thin liquids, recommended Dys. 1 textures with nectar-thick liquids and initiation of the water protocol. Patient and family education ongoing. Patient would benefit from continued skilled SLP intervention to maximize his cognitive and swallowing functioning and overall functional independence prior to discharge.      Intensity: Minumum of 1-2 x/day, 30 to 90 minutes Frequency: 3 to 5 out of 7 days Duration/Length of Stay: 4 weeks Treatment/Interventions: Cognitive remediation/compensation;Environmental controls;Cueing hierarchy;Functional tasks;Therapeutic Activities;Therapeutic Exercise;Dysphagia/aspiration precaution training;Medication managment;Patient/family education   Weston Anna 05/16/2020, 12:23 PM

## 2020-05-16 NOTE — Progress Notes (Addendum)
Pt down per bed to swallow study. No complications noted.  1000: Pt return to room per bed. No complications noted. Sheela Stack, LPN
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
## 2020-05-16 NOTE — Progress Notes (Signed)
Procedure Note: G-tube replacement  Patient was placed supine in bed. Abdomen was cleaned. Foley catheter was decompressed and removed easily from abdomen. A 22 Fr G-tube with 7-10cc bumper was then inserted along the established tract. Gastric contents were produced once the flange of tube was against the skin and the tube was in the stomach. The bumper was then filled with 10cc of saline and hold of the bumper was confirmed with outward traction on tube. Pt experienced mild pain during procedure but didn't do too badly from a behavioral standpoint given his ongoing restlessness and agitation. 2 nurses and one nurse tech assisted with procedure.  1 View KUB was ordered to confirm tube placement.   Will resume TF this evening as planned. Encourage PO intake during the day.    Meredith Staggers, MD, [facility] 05/16/2020

## 2020-05-16 NOTE — Progress Notes (Addendum)
Modified Barium Swallow Progress Note  Patient Details  Name: ANUAR WALGREN MRN: [id number] Date of Birth: 07-Sep-1970  Today's Date: 05/16/2020  Modified Barium Swallow completed.  Full report located under Chart Review in the Imaging Section.  Brief recommendations include the following:  Clinical Impression  Patient demonstrates a mild oral and pharyngeal dysphagia which is exacerbated by current cognitive deficits.  Patient demonstrates mild oral holding and delayed oral transit with both solids and liquids due to poor awareness of bolus.  Patient's decreased attention to task due to severe restlessness with inconsistent timing resulting in penetration and silent aspiration of thin liquids.  Due to patient's severe cognitive impairments requiring constant redirection and cues for sustained attention to task, recommend Dys. 1 textures with nectar-thick liquids with initiation of the water protocol.   Swallow Evaluation Recommendations       SLP Diet Recommendations: Dysphagia 1 (Puree) solids;Free water protocol after oral care;Nectar thick liquid   Liquid Administration via: Cup;No straw   Medication Administration: Crushed with puree   Supervision: Patient able to self feed;Full supervision/cueing for compensatory strategies   Compensations: Minimize environmental distractions;Slow rate;Small sips/bites   Postural Changes: Seated upright at 90 degrees   Oral Care Recommendations: Oral care BID   Other Recommendations: Order thickener from pharmacy;Prohibited food (jello, ice cream, thin soups);Remove water pitcher    Ferman Basilio 05/16/2020,12:16 PM

## 2020-05-16 NOTE — Progress Notes (Signed)
Physical Therapy Session Note  Patient Details  Name: Adam Bates MRN: [id number] Date of Birth: 1970-06-08  Today's Date: 05/16/2020 PT Individual Time: 1300-1400 PT Individual Time Calculation (min): 60 min   Short Term Goals: Week 1:  PT Short Term Goal 1 (Week 1): Pt will perform sit to stand w/RW and adhere to wbing precautions w/min assist PT Short Term Goal 2 (Week 1): stand pivot bed to/from wc w/RW and min assist adhering to wbing precautions PT Short Term Goal 3 (Week 1): pt will tolerate initiation of gait training PT Short Term Goal 4 (Week 1): Pt will recall route to  room from primary utilized treatment area w/minimal cueing required. Week 2:    Week 3:     Skilled Therapeutic Interventions/Progress Updates:     PAIN pt unable to assign number to pain, but RLE discomfort experienced w/bed mobility and when dangling w/knee in full flexion.  Repositioning and rest provided as needed.  Pt initially w/nursing mid donning of abd binder and handed off to PT.  Abdominal binder applied and loose end secured w/wide tape to secure and decrease attention to binder.  Pt dons shirt w/set up assist only.  Supine to sit w/min assist w/RLE due to pain w/movement.  In sitting dons gown w/min assist.    Pt intermittently using sexually inappropriate comments during session but w/very little awareness of inappropriateness.  Comments include "Can I see your pussy?"  And instead of calling tech by name substitues "vagina" for his name.  Apologizes when corrected and continues w/session unphased.    Pt performed squat pivot bed to wc w min to /mod assist and therapist elevating foot to prevent wbing.   Pt transported to gym, one other patient in gym and quiet setting.  Using leglifter to elevate foot and reorientation to situation/fx/wbing pt able to Sit to stand w/min assist.  Gait trials in parallel bars: Pt able to ambulate 4 x w/seated rest following each trial, 9ft per trail, wc  following for safety due to impulsive nature, therapist supports R foot w/bright blue leg lifter but this feedback was enough for pt to attend to and maintain NwBing during gait trials well.  Pt wc to mat lateral scoot w/mult cues required to initiate task. In sitting performed ball toss w/8in green rubber ball, decreased coordination of RUE noted w/task and ? Visual perceptual deficit vs attention + coordination.  Able to perform 2 min x 2, occasional self distractions.  Mat to wc as above, use of leg lifter as w/gait w/good success. Transported to room.  Pt resistant to returning to bed but nursing unable to supervise at desk at this time due to workload.  Pt requesting drink.  Oral care performed by pt w/set up and cues not to swallow but to spit.  Provided w/water and pt cooperative w/taking normal sips/not gulping.  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  Pt then agreeable to returning to bed.  Mult efforts required due to confusion w/task.  Pt attempts to put feet up on bed.  Transfers to bed as above but then attempts to lie across bed w/feet in wc.  After mult efforts/careful/calm cueing, pt eventually rights himself in the bed.  Wrist restraints and L mitt reapplied.  Pt left resting calmly in bed.       Therapy Documentation Precautions:  Precautions Precautions: Fall Precaution Comments: PEG TUBE/ABD BINDER TO PROTECT Other Brace: prevalon boot  for wound on pt's R heel Restrictions Weight Bearing Restrictions:  Yes RLE Weight Bearing: Non weight bearing   Therapy/Group: Individual Therapy  Callie Fielding, [place] 05/16/2020, 3:22 PM

## 2020-05-16 NOTE — Progress Notes (Signed)
Occupational Therapy Session Note  Patient Details  Name: Adam Bates MRN: [id number] Date of Birth: Sep 02, 1970  Today's Date: 05/16/2020 OT Individual Time: 1135-1159 OT Individual Time Calculation (min): 24 min   Short Term Goals: Week 1:  OT Short Term Goal 1 (Week 1): Pt will complete LB dressing with mod assist. OT Short Term Goal 2 (Week 1): Pt will follow 1-step commands during a self-care task with 50% accuracy. OT Short Term Goal 3 (Week 1): Pt will complete functional transfers with mod assist OT Short Term Goal 4 (Week 1): Pt will complete bathing with mod assist.     Skilled Therapeutic Interventions/Progress Updates:    Pt greeted in bed with RN present, appearing agitated and pulling at his abdominal binder + wrist restraints. Provided pt calming cues and he was agreeable to don new gripper socks during session. When handed socks, pt donned each one onto his hands like mittens. Pt was able to don his Lt sock with Min A given facilitation for placing his Lt LE in figure 4 position. Max A for the Rt sock. When pt was provided with sanitizer to wash hands, pt needed cues and assist to not orally consume the sanitizer. Oral care completed with Max A using nectar thickened water. Note that pt exhibits deficits with scanning to the Rt side/Rt inattention. Increased time and Min A for pt to lean forward using the bedrail on his Lt side in order for OT and RT to securely reapply his abdominal binder. Wrist restraints in place, call bell within reach, and bed alarm set before departure. Tx focus placed on following 1 step instruction, sustained attention to task, motor planning, and Rt attention.     Therapy Documentation Precautions:  Precautions Precautions: Fall Precaution Comments: PEG TUBE/ABD BINDER TO PROTECT Other Brace: prevalon boot  for wound on pt's R heel Restrictions Weight Bearing Restrictions: Yes RLE Weight Bearing: Non weight bearing Pain: no s/s pain during  tx Pain Assessment Pain Scale: 0-10 Pain Score: 0-No pain ADL:     Therapy/Group: Individual Therapy  Diannie Willner A Trason Shifflet 05/16/2020, 12:29 PM

## 2020-05-16 NOTE — Progress Notes (Signed)
Pt slept throught the night with minimal interruptions. Pt in no acute distress. RN will continue to monitor.

## 2020-05-17 ENCOUNTER — Inpatient Hospital Stay (HOSPITAL_COMMUNITY): Payer: Managed Care, Other (non HMO)

## 2020-05-17 DIAGNOSIS — S069X3S Unspecified intracranial injury with loss of consciousness of 1 hour to 5 hours 59 minutes, sequela: Secondary | ICD-10-CM | POA: Diagnosis not present

## 2020-05-17 MED ORDER — QUETIAPINE FUMARATE 50 MG PO TABS
50.0000 mg | ORAL_TABLET | Freq: Once | ORAL | Status: AC
  Administered 2020-05-17: 50 mg

## 2020-05-17 NOTE — Progress Notes (Signed)
Patient asleep with tube feed running.

## 2020-05-17 NOTE — Progress Notes (Signed)
RN called to pt's room and found pt sitting at the edge of bed with NT at bedside; managed to break bilateral soft wrist restraint. No skin breakdown noted on both arms. Patient agitated and wanted to get up. Dr. Ranell Patrick informed with order. RN and other staff assisted pt back in bed and placed bilateral wrist restraints. Meds given. Later, patient beginning to follow commands and become cooperative.

## 2020-05-17 NOTE — Progress Notes (Signed)
Pt slept from 2200 to 0200. At [place] pt began pulling at g tube r/t pain.  RN gave pt oxycodone to help with pain. Pt fell back to sleep around 0400am. RN will continue to monitor. Pt in no acute distress.

## 2020-05-17 NOTE — Progress Notes (Addendum)
Patient was agitated, shouting, and very restless in bed, trying to get up at the beginning of the shift. Family member at room and RN called to the room. RN administered scheduled Seroquel and Trazodone. Later, patient was assisted to bathroom and had a bowel movement. Patient returned to bed less restless.

## 2020-05-17 NOTE — Progress Notes (Signed)
Occupational Therapy Session Note  Patient Details  Name: Adam Bates MRN: [id number] Date of Birth: 16-Feb-1971  Today's Date: 05/17/2020 OT Individual Time: 0900-0950 OT Individual Time Calculation (min): 50 min   Session 2:  OT Individual Time: 0109-3235 OT Individual Time Calculation (min): 45 min    Short Term Goals: Week 1:  OT Short Term Goal 1 (Week 1): Pt will complete LB dressing with mod assist. OT Short Term Goal 2 (Week 1): Pt will follow 1-step commands during a self-care task with 50% accuracy. OT Short Term Goal 3 (Week 1): Pt will complete functional transfers with mod assist OT Short Term Goal 4 (Week 1): Pt will complete bathing with mod assist.  Skilled Therapeutic Interventions/Progress Updates:    Pt received supine with B wrist restraints on and restless in bed. Pt tangential with language of confusion throughout entire session, not oriented. B wrist restraints were removed as well as mitts. Pt required multimodal cueing for following directions and for attention to task. Reduced sexual/inappropraite language or behaviors with therapist today. Attempted to wrap his R LE with ace wrap and red coband to provide visual cue for NWB. Pt still required heavy use of leg lifter to maintain RLE NWB status. Pt pivoted to his TIS w/c with min A. He completed oral care at the sink with min A ,improvement in sequencing/motor planning. He was taken out of room in w/c and to dayroom where he requested to drink OJ. 2 nectar thick OJ's were given and pt required mod cueing for slow pace. He returned to room for toileting tasks. He completed stand pivot to the toilet with min A, again with heavy use of leg lifter to maintain NWB. He voided urine and required min A overall for clothing management. He returned to the w/c and then to EOB. New pants donned with mod A. Pt returned to supine and was incredibly resistant to donning of wrist restraints. He required extensive distraction,  redirection, and manual facilitation to eventually don B wrist restraints and mitts. Pt was left supine, bed alarm set.    Session 2:  Pt received in room, EOB with NT and [name]. Pt was crying and quite agitated. He was able to be redirected to task and he transferred to the TIS w/c with min A. He became agitated with OT mid-transfer for assisting holding up his RLE with a leg lifter and this was terminated to avoid further agitation/threat of violence. Pt's lunch tray was present and it was set up for patient. Therapeutic use of self to reduce agitation and promote independence with self feeding. Pt perseverative on apologizing to his wife throughout session, she did a good job of redirecting. Pt consumed 1/4 of his meal tray and drank all of a nectar thick tea. He doffed his shirt with min A and washed UB briefly with supervision. New shirt donned with min A. Reinforced peg tube with ace wrap 2/2 too small abdominal binder. Pt requested to use the bathroom and transferred with min A, again, very agitated with assistance of OT. He voided urine and then completed clothing management with min A. He was able to don new pajama pants with mod A. He returned to EOB and then to supine. He was more accepting of OT donning wrist restraints compared to earlier session. R hand mitted and L hand mitt left off as his wife held his hand. Informed her that mitt would need to be donned if she let go of his  hand, she agreed. Provided general TBI education to her as well. Pt was left with all needs met, RN informed of session and L hand unmitted.    Therapy Documentation Precautions:  Precautions Precautions: Fall Precaution Comments: PEG TUBE/ABD BINDER TO PROTECT Other Brace: prevalon boot  for wound on pt's R heel Restrictions Weight Bearing Restrictions: Yes RLE Weight Bearing: Non weight bearing  Therapy/Group: Individual Therapy  Adam Bates 05/17/2020, 7:53 AM

## 2020-05-17 NOTE — Progress Notes (Signed)
Patient had periods of agitation and confusion during the day shift.  Patient chewed through 2 sets of restraints.  Patient begged his wife to remove them as well.  Patient c/o pain in back and was given pain medication that seem to work.  Patient did eat meals with therapy present for lunch and wife present for dinner.  Patient was continent of bladder today but had some trouble starting.  Patient resting in bed with brother at bedside in no distress.
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
## 2020-05-18 DIAGNOSIS — R1312 Dysphagia, oropharyngeal phase: Secondary | ICD-10-CM | POA: Diagnosis not present

## 2020-05-18 DIAGNOSIS — S069X3S Unspecified intracranial injury with loss of consciousness of 1 hour to 5 hours 59 minutes, sequela: Secondary | ICD-10-CM | POA: Diagnosis not present

## 2020-05-18 MED ORDER — LORAZEPAM 2 MG/ML IJ SOLN
INTRAMUSCULAR | Status: AC
  Administered 2020-05-18: 0.5 mg via INTRAMUSCULAR
  Filled 2020-05-18: qty 1

## 2020-05-18 MED ORDER — LORAZEPAM 2 MG/ML IJ SOLN: 0.5000 mg | Freq: Once | INTRAMUSCULAR | Status: AC

## 2020-05-18 MED ORDER — QUETIAPINE FUMARATE 50 MG PO TABS
250.0000 mg | ORAL_TABLET | Freq: Every day | ORAL | Status: DC
  Administered 2020-05-18: 250 mg
  Filled 2020-05-18: qty 1
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
## 2020-05-18 NOTE — Progress Notes (Signed)
Patient became extremely agitated and high anxiety when RN was placing the tube feeding for night time.  Patient would not allow anyone to touch him.  Patient was screaming, rocking back and forth, cursing and trying to get out of the bed.  Wrist restraints removed for safety due to the patient rolling back and forth so violently.  RN plus staff tried to get an abdominal binder on the patient and patient was so agitated that binder could not be placed.  RN day and night shift tried to calm the patient with soft talking and letting him know what was going to happen.  Finally the patient calmed down and back in the bed but would not allow anyone to touch him.  Night shift charge nurse got IM Ativan to give the patient to help him calm down.  Patient had a good day up until 1830.

## 2020-05-19 ENCOUNTER — Inpatient Hospital Stay (HOSPITAL_COMMUNITY): Payer: Managed Care, Other (non HMO)

## 2020-05-19 ENCOUNTER — Ambulatory Visit (HOSPITAL_COMMUNITY): Payer: Managed Care, Other (non HMO)

## 2020-05-19 ENCOUNTER — Inpatient Hospital Stay (HOSPITAL_COMMUNITY): Payer: Managed Care, Other (non HMO) | Admitting: Speech Pathology

## 2020-05-19 DIAGNOSIS — F919 Conduct disorder, unspecified: Secondary | ICD-10-CM

## 2020-05-19 DIAGNOSIS — S069X3S Unspecified intracranial injury with loss of consciousness of 1 hour to 5 hours 59 minutes, sequela: Secondary | ICD-10-CM | POA: Diagnosis not present

## 2020-05-19 DIAGNOSIS — R1312 Dysphagia, oropharyngeal phase: Secondary | ICD-10-CM | POA: Diagnosis not present

## 2020-05-19 DIAGNOSIS — S82201S Unspecified fracture of shaft of right tibia, sequela: Secondary | ICD-10-CM | POA: Diagnosis not present

## 2020-05-19 MED ORDER — OLANZAPINE 2.5 MG PO TABS
2.5000 mg | ORAL_TABLET | Freq: Every day | ORAL | Status: DC
  Administered 2020-05-19 – 2020-05-20 (×2): 2.5 mg
  Filled 2020-05-19 (×3): qty 1

## 2020-05-19 MED ORDER — OLANZAPINE 5 MG PO TABS
5.0000 mg | ORAL_TABLET | Freq: Every day | ORAL | Status: DC
  Administered 2020-05-19 – 2020-05-20 (×2): 5 mg
  Filled 2020-05-19 (×3): qty 1

## 2020-05-19 MED ORDER — QUETIAPINE FUMARATE 50 MG PO TABS
100.0000 mg | ORAL_TABLET | Freq: Every day | ORAL | Status: DC
  Administered 2020-05-19 – 2020-05-22 (×4): 100 mg
  Filled 2020-05-19 (×4): qty 2

## 2020-05-19 NOTE — Progress Notes (Signed)
Physical Therapy Weekly Progress Note  Patient Details  Name: Adam Bates MRN: [id number] Date of Birth: 07-Dec-1970  Beginning of progress report period: May 11, 2019 End of progress report period: May 20, 2019  Today's Date: 05/19/2020   Patient has met 1 and partly met 2 of 4 short term goals.  Patient currently requires min A for bed mobility, squat pivot and sit to/stand transfers, and initiated gait 8 ft in the // bars. He was unable to maintain weight bearing precautions at the beginning of the week due to cognitive deficits and agitation. Patient now mostly consistent with maintaining R lower extremity NWB with assist from therapist's foot or leg lifter. Patient demonstrates poor awareness, frequently making inappropriate comments without self-awareness, can recognize that the comments are inappropriate when cued >50% of the time. Patient continues to have intermittent agitation triggered by increased stimulation, application of restraints, prevention of manipulating PEG tube, and during toileting. Patient can usually be redirected with increased time and in a low stimulation environment.   Patient continues to demonstrate the following deficits muscle weakness and muscle joint tightness, decreased cardiorespiratoy endurance, decreased coordination and decreased motor planning, decreased initiation, decreased attention, decreased awareness, decreased problem solving, decreased safety awareness and decreased memory and decreased sitting balance, decreased standing balance, decreased postural control, decreased balance strategies and difficulty maintaining precautions and therefore will continue to benefit from skilled PT intervention to increase functional independence with mobility.  Patient progressing toward long term goals..  Continue plan of care.  PT Short Term Goals Week 1:  PT Short Term Goal 1 (Week 1): Pt will perform sit to stand w/RW and adhere to wbing precautions w/min  assist PT Short Term Goal 1 - Progress (Week 1): Partly met PT Short Term Goal 2 (Week 1): stand pivot bed to/from wc w/RW and min assist adhering to wbing precautions PT Short Term Goal 2 - Progress (Week 1): Partly met PT Short Term Goal 3 (Week 1): pt will tolerate initiation of gait training PT Short Term Goal 3 - Progress (Week 1): Met PT Short Term Goal 4 (Week 1): Pt will recall route to  room from primary utilized treatment area w/minimal cueing required. PT Short Term Goal 4 - Progress (Week 1): Progressing toward goal Week 2:  PT Short Term Goal 1 (Week 2): Patient will perform basic transfers with min A while adhering to weight bearing precautions with cues consistently. PT Short Term Goal 2 (Week 2): Patient will ambulate 20 ft using LRAD with min A while adhering to weight bearing precautions. PT Short Term Goal 3 (Week 2): Pt will recall route to  room from primary utilized treatment area w/minimal cueing required.   Therapy Documentation Precautions:  Precautions Precautions: Fall Precaution Comments: PEG TUBE/ABD BINDER TO PROTECT Other Brace: prevalon boot  for wound on pt's R heel Restrictions Weight Bearing Restrictions: Yes RLE Weight Bearing: Non weight bearing   Therapy/Group: Individual Therapy  Joanette Silveria L Tashari Schoenfelder PT, DPT  05/19/2020, 5:57 AM

## 2020-05-19 NOTE — Progress Notes (Signed)
Late entry:  Patient chewed through another set of restraints and was found at the end of the bed by NT. Patient was offered to go to the restroom and NT plus RN assisted patient with a stedy to the restroom.  Patient was agitated due to pain in right leg.  Patient was unsuccessful with voiding.  Patient then placed back in the bed with new restraints placed.  Patient was not in distress.
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
## 2020-05-19 NOTE — Progress Notes (Signed)
Occupational Therapy Session Note  Patient Details  Name: Adam Bates MRN: [id number] Date of Birth: 1970-07-14  Today's Date: 05/19/2020 OT Individual Time: 1300-1401 OT Individual Time Calculation (min): 61 min  and Today's Date: 05/19/2020 OT Missed Time: 14 Minutes Missed Time Reason: Patient fatigue   Short Term Goals: Week 1:  OT Short Term Goal 1 (Week 1): Pt will complete LB dressing with mod assist. OT Short Term Goal 2 (Week 1): Pt will follow 1-step commands during a self-care task with 50% accuracy. OT Short Term Goal 3 (Week 1): Pt will complete functional transfers with mod assist OT Short Term Goal 4 (Week 1): Pt will complete bathing with mod assist.  Skilled Therapeutic Interventions/Progress Updates:    pt received supine with wrist restraints on, restless and calling out for help. OT entered room and removed wrist restraints.  Pt transferred into quadruped spontaneously and began c/o pain in his R leg. Pt was able to be redirected to initiate eating lunch at [employer]. He transferred with CGA. He required max A overall to sustain attention to eating lunch, with no overt s/s of aspiration, min cueing for slower pace. Pt completed stand pivot transfer from EOB to the TIS with CGA, heavy cueing for RLE NWB. Pt transferred to the toilet and required min A for clothing management. Pt voided BM and urine, completing peri hygiene with min cueing. Pt returned to the TIS w/c. He was emotionally labile throughout session and often mildly inappropriate with OT but redirected well. He changed his shirt with set up assist and pants with min A. Pt was taken out of the room to challenge sensory integration and tolerance in a more busy environment. He had no increase in agitation as he sat in louder office and drank a nectar thick OJ and ate 1/4 of a magic cup. Pt returned to his room and requested to get back into bed. He was left supine with B wrist restraints on, bed alarm set. 15 min missed d/t  pt hitting his tolerance of session.   Therapy Documentation Precautions:  Precautions Precautions: Fall Precaution Comments: PEG TUBE/ABD BINDER TO PROTECT Other Brace: prevalon boot  for wound on pt's R heel Restrictions Weight Bearing Restrictions: Yes RLE Weight Bearing: Non weight bearing   Therapy/Group: Individual Therapy  Curtis Sites 05/19/2020, 6:29 AM

## 2020-05-19 NOTE — Progress Notes (Signed)
Speech Language Pathology Daily Session Note  Patient Details  Name: Adam Bates MRN: [id number] Date of Birth: [birth year]  Today's Date: 05/19/2020 SLP Individual Time: 1100-1155 SLP Individual Time Calculation (min): 55 min  Short Term Goals: Week 2: SLP Short Term Goal 1 (Week 2): Patient will consume current diet with minimal overt s/s of aspiration and overall Mod A verbal cues for use of swallowing compensatory strategies. SLP Short Term Goal 2 (Week 2): Patient will consume trials of thin liquids via cup without overt s/s of aspiration over 3 sessions with Mod verbal cues for use of swallowing strategies to assess readiness for repeat MBS. SLP Short Term Goal 3 (Week 2): Patient will demonstrate sustained attention to functional tasks for 10 minutes with Mod verbal cues for redirection. SLP Short Term Goal 4 (Week 2): Patient will utilize external aids for orientation to time, place and situation with Max verbal and visual cues. SLP Short Term Goal 5 (Week 2): Patient will demonstrate functional problem solving for basic and familiar tasks with Mod verbal cues.  Skilled Therapeutic Interventions: Skilled treatment session focused on dysphagia and cognitive goals. Upon arrival, patient was naked in bed and appeared verbally frustrated. With extra time and overall Mod A verbal cues, patient was able to donn pants and a shirt. Patient impulsively moved to the EOB and attempted to stand. Patient reported he needed to use the commode and transferred to the [place] wheelchair with Mod verbal cues for safety. Patient was continent of urine. Patient also performed oral care via the suction toothbrush with Mod verbal cues for thoroughness. Patient consumed trials of thin liquids via cup without overt s/s of aspiration with Mod verbal cues needed for use of small sips. Max-Total A verbal cues were needed for self-feeding as he attempted to jam his spoon into a small water bottle. SLP also facilitated  session by providing Max-Total A for sorting coins from a field of 4. Patient with severe language of confusion throughout session. Patient transferred back to bed at end of session and left with alarm on, restraints in place and all needs within reach. Continue with current plan of care.      Pain No/Denies Pain   Therapy/Group: Individual Therapy  Karenann Mcgrory 05/19/2020, 12:39 PM

## 2020-05-20 ENCOUNTER — Inpatient Hospital Stay (HOSPITAL_COMMUNITY): Payer: Managed Care, Other (non HMO)

## 2020-05-20 ENCOUNTER — Inpatient Hospital Stay (HOSPITAL_COMMUNITY): Payer: Managed Care, Other (non HMO) | Admitting: Physical Therapy

## 2020-05-20 DIAGNOSIS — R1312 Dysphagia, oropharyngeal phase: Secondary | ICD-10-CM | POA: Diagnosis not present

## 2020-05-20 DIAGNOSIS — F919 Conduct disorder, unspecified: Secondary | ICD-10-CM | POA: Diagnosis not present

## 2020-05-20 DIAGNOSIS — S82201S Unspecified fracture of shaft of right tibia, sequela: Secondary | ICD-10-CM | POA: Diagnosis not present

## 2020-05-20 DIAGNOSIS — S069X3S Unspecified intracranial injury with loss of consciousness of 1 hour to 5 hours 59 minutes, sequela: Secondary | ICD-10-CM | POA: Diagnosis not present

## 2020-05-20 MED ORDER — VALPROIC ACID 250 MG/5ML PO SOLN
500.0000 mg | Freq: Three times a day (TID) | ORAL | Status: DC
  Administered 2020-05-20 – 2020-05-23 (×9): 500 mg
  Filled 2020-05-20 (×9): qty 10

## 2020-05-20 NOTE — Progress Notes (Signed)
Occupational Therapy Session Note  Patient Details  Name: Adam Bates MRN: [id number] Date of Birth: August 11, 1970  Today's Date: 05/20/2020 OT Individual Time: 9169-4503 OT Individual Time Calculation (min): 54 min    Short Term Goals: Week 1:  OT Short Term Goal 1 (Week 1): Pt will complete LB dressing with mod assist. OT Short Term Goal 2 (Week 1): Pt will follow 1-step commands during a self-care task with 50% accuracy. OT Short Term Goal 3 (Week 1): Pt will complete functional transfers with mod assist OT Short Term Goal 4 (Week 1): Pt will complete bathing with mod assist.  Skilled Therapeutic Interventions/Progress Updates:    Pt received sitting at nursing desk in [place] w/c requesting to use the bathroom. Pt completed stand pivot transfer to the toilet with CGA, requiring max cueing for R LE NWB status. Pt voided BM and urine. He completed peri hygiene with CGA. Pt was agreeable to take shower. He transferred to w/c with max cueing for NWB and technique. Pt impulsive and standing quickly without warning throughout session. He was unable to sustain attention to any task for more than 30 seconds without cueing. Severe language of confusion. Pt completed bathing overall with max A d/t attention and sequencing deficits. Pt returned to the TIS w/c and was brought to the sink. He sat and donned shirt with set up assist. Pants donned with mod A. Pt then transferred back to bed for supine rest break, both physically and for sensory stimulation as well. Pt then transferred to EOB for oral hygiene. He required physical initiation of oral care to then take over and continue himself. He transferred back to supine and allowed his wrist restraints to be donned. He was left supine with all needs met, bed alarm set.   Therapy Documentation Precautions:  Precautions Precautions: Fall Precaution Comments: PEG TUBE/ABD BINDER TO PROTECT Other Brace: prevalon boot  for wound on pt's R  heel Restrictions Weight Bearing Restrictions: Yes RLE Weight Bearing: Non weight bearing Therapy/Group: Individual Therapy  Curtis Sites 05/20/2020, 6:38 AM

## 2020-05-20 NOTE — Progress Notes (Signed)
Physical Therapy Session Note  Patient Details  Name: Adam Bates MRN: [id number] Date of Birth: 25-Jan-1971  Today's Date: 05/20/2020 PT Individual Time: 1030-1130 and 1300-1345 PT Individual Time Calculation (min): 60 min and 45 min  Short Term Goals: Week 2:  PT Short Term Goal 1 (Week 2): Patient will perform basic transfers with min A while adhering to weight bearing precautions with cues consistently. PT Short Term Goal 2 (Week 2): Patient will ambulate 20 ft using LRAD with min A while adhering to weight bearing precautions. PT Short Term Goal 3 (Week 2): Pt will recall route to  room from primary utilized treatment area w/minimal cueing required.  Skilled Therapeutic Interventions/Progress Updates:     Session 1: Patient in bed with NT in the room with B soft wrist restraints in place upon PT arrival. Patient alert, restless, and mildly agitated, pulling at restrains. Patient agreeable to working with PT, but very disoriented to situation and difficult to reorient at this time. Provided cues for relaxation strategies and for safety awareness with mobility and behaviors throughout session. Initially the patient became very agitated at the tightness of his wrist restraints and perseverated on therapist removing them. Removed restraints one at a time and patient agreeable to sit EOB with therapist to talk. Patient with increased language of confusion, responded well to therapist's attempts at providing what patient was asking for and reducing stimulation in the environment (turned TV off, closed the door, maintained 1 person in the room, lights off), however, patient took significant time to reduce tempo of speech and restlessness. Patient indicated several times that he needed to go to the bathroom, but was unable to follow cues to transfer to the TIS w/c to be transported into the bathroom.   Patient stood spontaneously x2 without adherence to his weight bearing precautions despite  therapists efforts with cues and facilitation, provided assist with patient shouting "sit down, I got this." On second stand the patient attempted to take a step with his R foot and his leg buckled. PT caught patient and assisted him back to bed with max A. Patient did catch himself on the bed with his arms to assist with returning to the bed. Patient then fatigued and agreeable to lying back in the bed. In lying patient was less restless, but perseverated on pulling at his abdominal binder, pulling it up/down. Educated on risk of injury without behavior change. Provided patient with cues for hand placement on the bed or bed rail and patient redirected with these cues.   Patient then perseverating on pulling off wrist restraints that were untied, but still fastened to his wrist, initially would not let therapist assist with this, but then agreeable. Patient returned to sitting EOB stating he needed to urinate, but did not attempt transfer to w/c or want to use the urinal when these were set up for him. Patient required increased time and redirection to return to lying and PT and LPN secured wrist restraints. Patient compliant during application, responding to therapist explaining the purpose of the restraints and that they were for his safety. Patient lying in bed with RN in the room, restraints secured, bed alarm set, and telesitter in place at end of session.   Session 2: Patient in bed with wrist restraints secured eating lunch with NT upon PT arrival. Patient alert and agreeable to working with PT at this time. Patient again, perseverating on restraint removal. Removed his R wrist restraint to allow patient to attempt to feed himself.  Patient demonstrating poor motor planning and apraxia with use of spoon with R upper extremity. Provided 2 bites with total A without aspiration, provided cues for performing dry swallow after each. Patient then knocked over his tea with his R hand and stated "I need to take a  piss right now."   Patient followed cues to wait for therapist to bring the TIS w/c over and transferred to the chair with min A placing R foot on therapist's to maintain NWB. PT transported the patient to the threshold of the bathroom, then the patient pulled out his penis and began to void onto the floor of the bathroom while seated in the w/c. Once finished patient returned his penis into his pants and asked to return to the bed. When asked about this behavior, patient unable to determine that this was inappropriate, stating "I just had to go." He returned to the bed, performing transfer as above, however, he did not wait for the w/c to be removed and placed both legs in the chair while seated on the bed then scooted back into the bed until both legs were resting on the bed.  Once in the bed, patient noted to be incontinent of bowl. Patient performed rolling R/L with min-mod A with delayed initiation. Patient's pants, brief, shirt, and bed linens were removed, peri-care was performed with total A, and a new brief was donned with total A and patient assisted with donning shorts with mod A and a shirt with min A in long sitting before returning to lying.   B soft wrist restraints were applied with same education provided in earlier session to improve patient compliance with task. Patient in bed at end of session with breaks locked, bed alarm set, and all needs within reach. New bed linens were not available during session, RN and NT made aware.    Therapy Documentation Precautions:  Precautions Precautions: Fall Precaution Comments: PEG TUBE/ABD BINDER TO PROTECT Other Brace: prevalon boot  for wound on pt's R heel Restrictions Weight Bearing Restrictions: Yes RLE Weight Bearing: Non weight bearing   Therapy/Group: Individual Therapy  Jontae Adebayo L Darey Hershberger PT, DPT  05/20/2020, 3:56 PM
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
## 2020-05-20 NOTE — Progress Notes (Signed)
Speech Language Pathology Daily Session Note  Patient Details  Name: ALEXI GEIBEL MRN: [id number] Date of Birth: Sep 16, 1970  Today's Date: 05/20/2020 SLP Individual Time: 1415-1500 SLP Individual Time Calculation (min): 45 min  Short Term Goals: Week 2: SLP Short Term Goal 1 (Week 2): Patient will consume current diet with minimal overt s/s of aspiration and overall Mod A verbal cues for use of swallowing compensatory strategies. SLP Short Term Goal 2 (Week 2): Patient will consume trials of thin liquids via cup without overt s/s of aspiration over 3 sessions with Mod verbal cues for use of swallowing strategies to assess readiness for repeat MBS. SLP Short Term Goal 3 (Week 2): Patient will demonstrate sustained attention to functional tasks for 10 minutes with Mod verbal cues for redirection. SLP Short Term Goal 4 (Week 2): Patient will utilize external aids for orientation to time, place and situation with Max verbal and visual cues. SLP Short Term Goal 5 (Week 2): Patient will demonstrate functional problem solving for basic and familiar tasks with Mod verbal cues.  Skilled Therapeutic Interventions: Skilled treatment session focused on dysphagia and cognitive goals. Pt agreeable to trials puree dessert and ice chips, consuming 4 oz puree and 10 ice chips with apparently function oral and pharyngeal phase of swallow, no overt s/s aspiration. Pt continues to required total A verbal cues for use of swallow strategies. SLP facilitating tx session by providing Max A for basic orientation task and biographical info task. Pt with very limited carryover of learned information within treatment session. Pt with severe language confusion throughout treatment session, becoming increasingly agitated as session progressed despite verbal and visual redirection. Pt eventually needing to use bathroom, nursing assisted and pt left in nursing care. Cont ST POC.   Pain Pain Assessment Pain Scale: 0-10 Pain  Score: 0-No pain  Therapy/Group: Individual Therapy  Dewaine Conger 05/20/2020, 2:58 PM

## 2020-05-20 NOTE — Progress Notes (Signed)
Occupational Therapy Weekly Progress Note  Patient Details  Name: Adam Bates MRN: [id number] Date of Birth: Apr 11, 1971  Beginning of progress report period: May 10, 2020 End of progress report period: May 20, 2020   Patient has met 3 of 4 short term goals.  Pt has made steady progress this reporting period improving transfers from min-mod A with varying adherence to WB precautions. Pt can complete UB dressing with supervision, LB dressing with MOD A and toileting/bathing with MAX A d/t poor adherence to WB precautions and cognitive deficits impacting attention/initiation/awareness. Pt continues to be limited by agitation, confusion and decreased orientation impacting performance and safety of BADLs  Patient continues to demonstrate the following deficits: muscle weakness, decreased cardiorespiratoy endurance, impaired timing and sequencing, motor apraxia, decreased coordination and decreased motor planning, decreased visual acuity, decreased initiation, decreased attention, decreased awareness, decreased problem solving, decreased safety awareness and decreased memory and decreased standing balance, decreased postural control, decreased balance strategies and difficulty maintaining precautions and therefore will continue to benefit from skilled OT intervention to enhance overall performance with BADL and Reduce care partner burden.  Patient progressing toward long term goals..  Continue plan of care.  OT Short Term Goals Week 1:  OT Short Term Goal 1 (Week 1): Pt will complete LB dressing with mod assist. OT Short Term Goal 1 - Progress (Week 1): Met OT Short Term Goal 2 (Week 1): Pt will follow 1-step commands during a self-care task with 50% accuracy. OT Short Term Goal 2 - Progress (Week 1): Met OT Short Term Goal 3 (Week 1): Pt will complete functional transfers with mod assist OT Short Term Goal 3 - Progress (Week 1): Met OT Short Term Goal 4 (Week 1): Pt will complete bathing  with mod assist. OT Short Term Goal 4 - Progress (Week 1): Progressing toward goal Week 2:  OT Short Term Goal 1 (Week 2): Pt will bathe with MOD A to demo improved attention OT Short Term Goal 2 (Week 2): Pt will adhere to WB precautions during 75% of transfers OT Short Term Goal 3 (Week 2): Pt will clearly communicate wants and needs with mod cuing 50% of time OT Short Term Goal 4 (Week 2): Pt will complete toilet transfer with MIN A consistently    Therapy/Group: Individual Therapy  Tonny Branch 05/20/2020, 5:59 PM

## 2020-05-20 NOTE — Progress Notes (Signed)
Patient ID: Adam Bates, male   DOB: 1970-08-08, 50 y.o.   MRN: [id number]  SW received phone call from pt wife Adam Bates to discuss updates from team conference. SW provided updates from team and d/c date 2/8. Wife had a lot of questions related to behaviors. SW did reiterate attending was working on appropriate medications to help subside behaviors. Amount of support she will have at discharge is to be determined. SW reminded her to plan for 24/7 care. SW discussed alternative options such as HH and SNF pending the amount of support she is able to have in place. SW to check in with wife next week to discuss further. SW to leave Medicaid application and SSDI instructions in room.   *Packet of info left in room.   [name], MSW, [place] Office: [phone] Cell: [phone] Fax: [phone]
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
## 2020-05-20 NOTE — Progress Notes (Signed)
Pt down to xray per bed. Restraints in place, needs met prior to transport. No complications noted. Sheela Stack, LPN
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
## 2020-05-21 ENCOUNTER — Inpatient Hospital Stay (HOSPITAL_COMMUNITY): Payer: Managed Care, Other (non HMO)

## 2020-05-21 DIAGNOSIS — S069X3S Unspecified intracranial injury with loss of consciousness of 1 hour to 5 hours 59 minutes, sequela: Secondary | ICD-10-CM | POA: Diagnosis not present

## 2020-05-21 DIAGNOSIS — F919 Conduct disorder, unspecified: Secondary | ICD-10-CM | POA: Diagnosis not present

## 2020-05-21 DIAGNOSIS — R1312 Dysphagia, oropharyngeal phase: Secondary | ICD-10-CM | POA: Diagnosis not present

## 2020-05-21 DIAGNOSIS — S82201S Unspecified fracture of shaft of right tibia, sequela: Secondary | ICD-10-CM | POA: Diagnosis not present

## 2020-05-21 MED ORDER — OLANZAPINE 5 MG PO TABS
5.0000 mg | ORAL_TABLET | Freq: Every day | ORAL | Status: DC
  Administered 2020-05-21 – 2020-05-23 (×3): 5 mg
  Filled 2020-05-21 (×3): qty 1

## 2020-05-21 MED ORDER — LORAZEPAM 2 MG/ML IJ SOLN
1.0000 mg | Freq: Three times a day (TID) | INTRAMUSCULAR | Status: DC | PRN
  Administered 2020-05-21: 1 mg via INTRAMUSCULAR
  Filled 2020-05-21 (×2): qty 1

## 2020-05-21 MED ORDER — LORAZEPAM 1 MG PO TABS: 1.0000 mg | ORAL_TABLET | Freq: Once | ORAL | Status: AC

## 2020-05-21 MED ORDER — LORAZEPAM 2 MG/ML IJ SOLN
1.0000 mg | Freq: Once | INTRAMUSCULAR | Status: AC
  Administered 2020-05-21: 1 mg via INTRAMUSCULAR

## 2020-05-21 MED ORDER — OLANZAPINE 5 MG PO TABS
7.5000 mg | ORAL_TABLET | Freq: Every day | ORAL | Status: DC
  Administered 2020-05-21 – 2020-05-22 (×2): 7.5 mg
  Filled 2020-05-21 (×4): qty 1

## 2020-05-21 MED ORDER — LORAZEPAM 1 MG PO TABS: 1.0000 mg | ORAL_TABLET | Freq: Three times a day (TID) | ORAL | Status: DC | PRN

## 2020-05-21 MED ORDER — CITALOPRAM HYDROBROMIDE 10 MG/5ML PO SOLN
30.0000 mg | Freq: Every day | ORAL | Status: DC
  Administered 2020-05-21 – 2020-05-22 (×2): 30 mg
  Filled 2020-05-21 (×3): qty 20

## 2020-05-21 NOTE — Progress Notes (Signed)
Occupational Therapy Session Note  Patient Details  Name: Adam Bates MRN: [id number] Date of Birth: Sep 03, 1970  Today's Date: 05/21/2020 OT Individual Time: 8563-1497 OT Individual Time Calculation (min): 75 min    Short Term Goals: Week 1:  OT Short Term Goal 1 (Week 1): Pt will complete LB dressing with mod assist. OT Short Term Goal 1 - Progress (Week 1): Met OT Short Term Goal 2 (Week 1): Pt will follow 1-step commands during a self-care task with 50% accuracy. OT Short Term Goal 2 - Progress (Week 1): Met OT Short Term Goal 3 (Week 1): Pt will complete functional transfers with mod assist OT Short Term Goal 3 - Progress (Week 1): Met OT Short Term Goal 4 (Week 1): Pt will complete bathing with mod assist. OT Short Term Goal 4 - Progress (Week 1): Progressing toward goal  Skilled Therapeutic Interventions/Progress Updates:    Per PA note PWB-30% on RLE at this time based on Xray findings  Pt received in TIS at RN station with generally calm demeanor. Mild restlessness at times likely d/t pain/discomfort in RLE with pt wiggling hips often in w/c. Increased restlessness with Pt tilted forward in w/c. NT reporting just cleaned and changed pt  Therapeutic activity Focus of session on focused/sustained attention as well as visual scanning through functional tasks: - BITS visual scanning demo poor R visual scanning looking for stimuli requiring MAX multimodal cuing  - folding 10 towels in bouts of 6 and 4 with OT following 1 step commands and placing on pile on R side of pt for R attention -2x1 min of UE ergometer, however tilted forward in w/c pt reporting back pain therefore task terminated -self feeding: pt calmly eating magic cup/drinking nectar thick liquids x15 min seated at window in quiet environment with pt intermittently setting magic cup on table resting on R of pt for forced R attention . Pt requires min VC for slowing rate of consumption, however no overt s/s of  aspiration. 1 throat clear noted.  - color selection task red/yellow leggos on the right on the table. Pt able to select correct color 7/8 times on command and stack together in alternating pattern with total cuing for aligning leggos.    Pt left at end of session in  with exit alarm on, call light in reach and all needs met   Therapy Documentation Precautions:  Precautions Precautions: Fall Precaution Comments: PEG TUBE/ABD BINDER TO PROTECT Other Brace: prevalon boot  for wound on pt's R heel Restrictions Weight Bearing Restrictions: Yes RLE Weight Bearing: Non weight bearing General:   Vital Signs: Therapy Vitals Temp: 98.9 F (37.2 C) Temp Source: Oral Pulse Rate: 100 Resp: 18 BP: 119/78 Patient Position (if appropriate): Lying Pain: Pain Assessment Pain Scale: Faces Faces Pain Scale: No hurt Pain Type: Acute pain Pain Location: Leg Pain Orientation: Right Pain Descriptors / Indicators: Aching;Grimacing Pain Onset: On-going Patients Stated Pain Goal: 2 Pain Intervention(s): Medication (See eMAR);Repositioned ADL:   Vision   Perception    Praxis   Exercises:   Other Treatments:     Therapy/Group: Individual Therapy  Tonny Branch 05/21/2020, 6:54 AM

## 2020-05-21 NOTE — Progress Notes (Addendum)
Speech Language Pathology Daily Session Note  Patient Details  Name: Adam Bates MRN: [id number] Date of Birth: 1971-02-08  Today's Date: 05/21/2020 SLP Individual Time: 1430-1500 SLP Individual Time Calculation (min): 30 min  Short Term Goals: Week 2: SLP Short Term Goal 1 (Week 2): Patient will consume current diet with minimal overt s/s of aspiration and overall Mod A verbal cues for use of swallowing compensatory strategies. SLP Short Term Goal 2 (Week 2): Patient will consume trials of thin liquids via cup without overt s/s of aspiration over 3 sessions with Mod verbal cues for use of swallowing strategies to assess readiness for repeat MBS. SLP Short Term Goal 3 (Week 2): Patient will demonstrate sustained attention to functional tasks for 10 minutes with Mod verbal cues for redirection. SLP Short Term Goal 4 (Week 2): Patient will utilize external aids for orientation to time, place and situation with Max verbal and visual cues. SLP Short Term Goal 5 (Week 2): Patient will demonstrate functional problem solving for basic and familiar tasks with Mod verbal cues.  Skilled Therapeutic Interventions: Skilled treatment session focused on cognitive goals, pt calm and lethargic 2' medication for agitation earlier in the day. Pt does rouse easily to verbal stim, however attention to task ~1 min before becoming drowsy again. Pt requiring max A to increase awareness during basic biographical and temporal orientation task. Pt was able to effectively communicate basic wants/needs throughout session given extra time and repetition of simple yes/no prompts. Treatment halted at this time d/t patient unable to maintain alertness enough for active therapy. Orthopedic PA present at time of ended session, pt left in bed in her care with B soft wrist restraints intact and bed alarm active. Cont ST POC.  Pain Pain Assessment Pain Scale: Faces Faces Pain Scale: Hurts a little bit Pain Location: Back Pain  Intervention(s): Repositioned  Therapy/Group: Individual Therapy  Dewaine Conger 05/21/2020, 3:07 PM
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
## 2020-05-21 NOTE — Progress Notes (Signed)
Physical Therapy Session Note  Patient Details  Name: Adam Bates MRN: [id number] Date of Birth: 22-May-1970  Today's Date: 05/21/2020 PT Individual Time: 1200-1300 PT Individual Time Calculation (min): 60 min   Short Term Goals: Week 2:  PT Short Term Goal 1 (Week 2): Patient will perform basic transfers with min A while adhering to weight bearing precautions with cues consistently. PT Short Term Goal 2 (Week 2): Patient will ambulate 20 ft using LRAD with min A while adhering to weight bearing precautions. PT Short Term Goal 3 (Week 2): Pt will recall route to  room from primary utilized treatment area w/minimal cueing required.  Skilled Therapeutic Interventions/Progress Updates:     Patient in bed yelling for help and pulling at his wrist restraints upon PT arrival. Initiated PT session 1 hour early to work on deescalating agitation and improve patient's restlessness with initiation of functional mobility.  Patient relieved that therapist arrived and eager to get out of bed. Patient agreeable to have one restraint removed at a time and slowly came to sitting EOB. Performed bed mobility with supervision throughout session. In sitting, the patient stated he needed to use the bathroom and performed a squat pivot transfer with his R foot forward to reduce weight bearing with CGA. The patient then perseverated on leaving the room rather than focusing on going to the bathroom. Applied his R leg rest and as PT turned to grab the other leg rest the patient use his L foot and hands on the wheels of the TIS w/c with the chair tilted back to propel himself out the door. PT attempted to redirect the patient without success.   Patient with increased restlessness in a busy environment by the nurses station, asked some appropriate question with intermittent language of confusion about his environment and the people around him. Attempted to transport the patient to the Day room or speech office for a  quieter environment, however patient located the main hallway toward the main therapy gym and would not redirect or allow staff to turn him around.   Patient stood 3 times out of the chair with close supervision, but would not allow therapist to assist him. Once through the open double doors by the elevators the patient attempted to stand and walk toward the elevators x2, redirected patient back to sitting x1, but on second attempt, Ben, PT and rehab supervisor stepped in to manually facilitated the patient back into the chair. The TIS w/c was tilted all the way back by this therapist and patient was transported back to his room and returned to the bed, transferred as above. Patient was very difficult to redirect to lying in the bed and distracted by multiple staff members being in the room. Able to direct the patient to lying with 3 therapists in the room for staff and patient safety. Ofered patient lunch and juice as distractions from patient's focus on leaving the room. Patient declined everything offered to him at this time.   RN arrived to provide patient with a shot, patient stated agreement and rolled to his L to allow the RN to administer the shot in his L thigh. Before the shot could be administered, the patient grabbed the nurses hand and required heavy cues and manual facilitation to let go of the needle. No staff or the patient were punctured by the needle during the incident. The patient again attempted to get out of the bed and stand. PT able to redirect the patient to sitting then lying  with increased time and cues. All other staff left the room and patient agreeable to have PT sit with him for 5 min.  Oriented patient to place and situation, educated the patient about the purpose of rehab and staff's role in keeping him safe and assisting with his recovery. Patient then agreeable to having the medication administered, but not agreeable to having his wrist restraints placed at this time. Ben,  PT/therapy supervisor assisted in negotiating with the patient to have the restraints put in place for the patient's and the staff's safety. Patient initially stated agreement, but resisted when restraints were attempted to be applied. Nursing staff was brought in to assist with manually restraining the patient as medication was administered and B soft wrist restraints were secured. PT provided emotional support for the patient and explained why he was being restrained and patient less resistive than on previous attempts.   Patient lying in bed with B soft wrist restraints secured with telesitter in place at end of session with breaks locked, bed alarm set, and all needs within reach.    Therapy Documentation Precautions:  Precautions Precautions: Fall Precaution Comments: PEG TUBE/ABD BINDER TO PROTECT Other Brace: prevalon boot  for wound on pt's R heel Restrictions Weight Bearing Restrictions: Yes RLE Weight Bearing: Weight Bearing As Tolerated    Therapy/Group: Individual Therapy  Eiliyah Reh L Lenia Housley PT, DPT  05/21/2020, 5:54 PM

## 2020-05-22 ENCOUNTER — Inpatient Hospital Stay (HOSPITAL_COMMUNITY): Payer: Managed Care, Other (non HMO) | Admitting: Speech Pathology

## 2020-05-22 ENCOUNTER — Inpatient Hospital Stay (HOSPITAL_COMMUNITY): Payer: Managed Care, Other (non HMO)

## 2020-05-22 ENCOUNTER — Inpatient Hospital Stay (HOSPITAL_COMMUNITY): Payer: Managed Care, Other (non HMO) | Admitting: Occupational Therapy

## 2020-05-22 DIAGNOSIS — S069X3S Unspecified intracranial injury with loss of consciousness of 1 hour to 5 hours 59 minutes, sequela: Secondary | ICD-10-CM | POA: Diagnosis not present

## 2020-05-22 DIAGNOSIS — R1312 Dysphagia, oropharyngeal phase: Secondary | ICD-10-CM | POA: Diagnosis not present

## 2020-05-22 DIAGNOSIS — F919 Conduct disorder, unspecified: Secondary | ICD-10-CM | POA: Diagnosis not present

## 2020-05-22 DIAGNOSIS — S82201S Unspecified fracture of shaft of right tibia, sequela: Secondary | ICD-10-CM | POA: Diagnosis not present

## 2020-05-22 MED ORDER — CALCIUM POLYCARBOPHIL 625 MG PO TABS
625.0000 mg | ORAL_TABLET | Freq: Every day | ORAL | Status: DC
  Administered 2020-05-22 – 2020-05-23 (×2): 625 mg via ORAL
  Filled 2020-05-22 (×2): qty 1
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
## 2020-05-22 NOTE — Progress Notes (Signed)
Pt continues to rest comfortably in enclosure bed. Wife at bedside. No complications noted. Sheela Stack, LPN

## 2020-05-22 NOTE — Progress Notes (Signed)
Speech Language Pathology Daily Session Note  Patient Details  Name: Adam Bates MRN: [id number] Date of Birth: [birth year]  Today's Date: 05/22/2020 SLP Individual Time: 4696-2952 SLP Individual Time Calculation (min): 55 min  Short Term Goals: Week 2: SLP Short Term Goal 1 (Week 2): Patient will consume current diet with minimal overt s/s of aspiration and overall Mod A verbal cues for use of swallowing compensatory strategies. SLP Short Term Goal 2 (Week 2): Patient will consume trials of thin liquids via cup without overt s/s of aspiration over 3 sessions with Mod verbal cues for use of swallowing strategies to assess readiness for repeat MBS. SLP Short Term Goal 3 (Week 2): Patient will demonstrate sustained attention to functional tasks for 10 minutes with Mod verbal cues for redirection. SLP Short Term Goal 4 (Week 2): Patient will utilize external aids for orientation to time, place and situation with Max verbal and visual cues. SLP Short Term Goal 5 (Week 2): Patient will demonstrate functional problem solving for basic and familiar tasks with Mod verbal cues.  Skilled Therapeutic Interventions: Pt was seen for skilled ST targeting swallowing and cognitive goals. SLP facilitated session with Min-Mod A cues for sequencing for pt to complete oral care, although he did swallow some water he intended to spit out at end. Pt consumed trials of ice and thin H2O (at least 4 oz thin H2O total) without overt s/sx aspiration. He then accepted a trials of Dys 2 (minced/ground) snack with efficient mastication and oral clearance, and no overt s/sx aspiration despite very large bites and somewhat impulsive rate of intake - pt was minimally responsive to verbal cues for safer swallowing strategies. Recommend continue current diet, however suspect as pt's attention and awareness improves, he will be able to advance.  Severe language of confusion still evident throughout session, although pt did sustain  attention to conversations with only 1 attempt to leave the bed. When this occurred, moderate verbal cues were provided for following basic 1 step directions to reposition and assume safe position in bed. Pt accurately verbalized and identified 1 of his daughters in a family photo ([place]). Pt's wife arrived during session, and he appeared to benefit from her calm presence sitting edge of bed and supporting functional conversation. Pt left laying in bed with alarm set, wife and NT at bedside. Continue per current plan of care.      Pain Pain Assessment Pain Scale: 0-10 Pain Score: 0-No pain  Therapy/Group: Individual Therapy  Arbutus Leas 05/22/2020, 7:28 AM

## 2020-05-22 NOTE — Progress Notes (Addendum)
Patient assessed resting and remains asleep in no acute distress or discomfort, respiration even and unlabored on room air.Sleep chart continue monitoring patient slept from 46mn to current time, Rounding done and incontinent care provided by assigned NT Emily,Tele-sitter continue monitoring

## 2020-05-22 NOTE — Progress Notes (Addendum)
Physical Therapy Session Note  Patient Details  Name: Adam Bates MRN: [id number] Date of Birth: [birth year]  Today's Date: 05/22/2020 PT Individual Time: 0930-1010 and 1500-1530 PT Individual Time Calculation (min): 40 min and 30 min  Short Term Goals: Week 2:  PT Short Term Goal 1 (Week 2): Patient will perform basic transfers with min A while adhering to weight bearing precautions with cues consistently. PT Short Term Goal 2 (Week 2): Patient will ambulate 20 ft using LRAD with min A while adhering to weight bearing precautions. PT Short Term Goal 3 (Week 2): Pt will recall route to  room from primary utilized treatment area w/minimal cueing required.  Skilled Therapeutic Interventions/Progress Updates:     Session 1: 0905: Patient in bed asleep with B soft wrist restraints in place and RN in the room finishing up medication administration upon PT arrival. Patient sleeping soundly with minimal response to verbal and tactile cues. Patient eventually did respond when ask if he wanted PT to come back later, stating "yeah." Will re-attempt to initiate PT session in 20 min, RN made aware.  0930-1010: Returned with patient asleep in the bed, soft wrist restraints still in place. Patient slow to arouse, but did arouse to verbal and tactile stimulation. Patient slow to initiate and reported feeling "drowsy" today. Patient reported R lower extremtiy pain during session, RN made aware and informed the patient that she will provide pain medication at appropriate time interval. PT provided repositioning, rest breaks, and distraction as pain interventions throughout session.   Patient slow to initiate mobility and less verbal this session. Continues with perseverative phrases and language of confusion. No signs of restlessness or agitation throughout session today.   Patient incontinent of bowl and bladder at beginning of session.  Therapeutic Activity: Bed Mobility: Patient performed rolling R/L  with min A-CGA to remove underwear and incontinence brief with total A. Performed peri-care with total A, applied Butt cream for skin irritation/redness and donned a clean incontinence brief. Patient flatulent during peri-care and reported that he needed to have a BM upon questioning. He performed supine to/from sit with min A for R lower extremity management and 3 count for initiation due to several unsuccessful attempts for patient to initiate sitting EOB.   Transfers: Patient performed squat pivot transfers bed<>TIS w/c and TIS w/c<>toilet with CGA-min A. Provided verbal cues for initiation, hand placement, and sequencing. Patient placed his R foot ahead of his L without cues today to reduce weight bearing.  Patient was continent of bowl and bladder during toileting. Spontaneously reached to wipe without toilet paper following BM an wrist restraint, that was still around his wrist while unsecured, was soiled and removed. Patient stood to have peri-care performed with total A and new incontinence brief donned. Patient donned shorts with mod-max A, performing a stand with RW with CGA for safety/balance, provided cues for hand placement and stabilization of RW. Donned shirt with set-up assist while seated in the w/c. Patient transferred back to bed as above. Explained that the soft wrist restraints were for his safety and patient allowed PT to reapply them without resistance.  Maintained no more than 2 people in the room throughout session. Second person for safety and equipment management due to patient impulsivity. Lights remained dim and door shut to maintain a low stimulation environment throughout session.  Patient in bed with B soft wrist restraints in place and telesitter in the room at end of session with breaks locked, bed alarm set, and all needs  within reach.   Session 2: Discussed patient fall with RN, patient awaiting imaging of R lower extremity. Per RN, will proceed with bed level therapy at  this time. Patient in bed with his wife at bedside upon PT arrival. Patient alert and agreeable to PT session. Patient reported R lower extremity pain during session, however did display signs of distress and remained resting comfortably on his L side during session. RN made aware. PT provided repositioning, rest breaks, and distraction as pain interventions throughout session. Patient to remain without restraints donned with 1-1 supervision per charge nurse.   Provided patient and his wife on general TBI and [place] IV symptoms and recovery. Informed then of the patient's progress with therapies and barriers being address and interventions implemented to improve patient's safety. Patient's wife reports that she was provided education on TBI in the neuro ICU and has been reading many recommended resources. Discussed strategies that she has found successful with the patient and reinforced and educated on use of low stimulation environment and redirection. Patient's wife appreciative of education and patient calm and intermittently attentive. He did disrupt the discussion to ask for something to drink, but then did not sit up to drink then when PT returned with 2 options of nectar thick liquids. Transport then arrived to take the patient to radiology. Patient attempting to sit up to get OOB, stating I can walk there. PT redirected the patient into lying and patient agreeable to rest in the bed during transport.  Patient in bed without restraints with NT accompanying transport team to take patient to imaging, per charge nurse instruction, at end of session with breaks locked, bed alarm set, and all needs within reach.    Therapy Documentation Precautions:  Precautions Precautions: Fall Precaution Comments: PEG TUBE/ABD BINDER TO PROTECT Other Brace: prevalon boot  for wound on pt's R heel Restrictions Weight Bearing Restrictions: Yes RLE Weight Bearing: Non weight bearing General: PT Amount of Missed  Time (min): 20 Minutes PT Missed Treatment Reason: Patient fatigue   Therapy/Group: Individual Therapy  Adam Bates PT, DPT  05/22/2020, 3:37 PM

## 2020-05-22 NOTE — Progress Notes (Signed)
   05/22/20 1335  What Happened  Was fall witnessed? Yes  Who witnessed fall? Scot Dock NT  Patients activity before fall to/from bed, chair, or stretcher  Point of contact hip/leg  Was patient injured? Unsure  Patient found on floor  Found by Staff-comment ([place] representative)  Stated prior activity  (needed to go to the bathroom)  Follow Up  MD notified [facility]  Time MD notified [phone]  Additional tests Yes-comment (xray leg)  Progress note created (see row info) Yes  Adult Fall Risk Assessment  Risk Factor Category (scoring not indicated) High fall risk per protocol (document High fall risk);Fall has occurred during this admission (document High fall risk)  Age 50  Fall History: Fall within 6 months prior to admission 5  Elimination; Bowel and/or Urine Incontinence 2  Elimination; Bowel and/or Urine Urgency/Frequency 2  Medications: includes PCA/Opiates, Anti-convulsants, Anti-hypertensives, Diuretics, Hypnotics, Laxatives, Sedatives, and Psychotropics 5  Patient Care Equipment 0  Mobility-Assistance 2  Mobility-Gait 2  Mobility-Sensory Deficit 0  Altered awareness of immediate physical environment 1  Impulsiveness 2  Lack of understanding of one's physical/cognitive limitations 4  Total Score 25  Patient Fall Risk Level High fall risk  Adult Fall Risk Interventions  Required Bundle Interventions *See Row Information* High fall risk - low, moderate, and high requirements implemented  Additional Interventions Room near nurses station;Reorient/diversional activities with confused patients;Pharmacy review of medications  Screening for Fall Injury Risk (To be completed on HIGH fall risk patients) - Assessing Need for Low Bed  Specialty Low Bed Contraindicated Hip precautions (enclosure bed ordered)  Screening for Fall Injury Risk (To be completed on HIGH fall risk patients who do not meet crieteria for Low Bed) - Assessing Need for Floor Mats Only  Risk For Fall  Injury- Criteria for Floor Mats Noncompliant with safety precautions  Will Implement Floor Mats Yes

## 2020-05-22 NOTE — Progress Notes (Addendum)
Pt fell in room. Vitals obtained, assessment completed. Injury noted to right leg. No injuries to head. PA Dan notified of pt fall. New orders received. Family notified of pt fall. Questions answered. Family coming to see pt.  Sheela Stack, LPN

## 2020-05-22 NOTE — Progress Notes (Signed)
Pt in enclosure bed at this time. No complications at this time. Wife at bedside. Pt slightly restless/agitated.  Sheela Stack
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
## 2020-05-22 NOTE — Progress Notes (Signed)
Occupational Therapy Session Note  Patient Details  Name: DECORIAN SCHUENEMANN MRN: [id number] Date of Birth: 10-Apr-1971  Today's Date: 05/22/2020 OT Individual Time: 1115-1200 OT Individual Time Calculation (min): 45 min    Short Term Goals: Week 1:  OT Short Term Goal 1 (Week 1): Pt will complete LB dressing with mod assist. OT Short Term Goal 1 - Progress (Week 1): Met OT Short Term Goal 2 (Week 1): Pt will follow 1-step commands during a self-care task with 50% accuracy. OT Short Term Goal 2 - Progress (Week 1): Met OT Short Term Goal 3 (Week 1): Pt will complete functional transfers with mod assist OT Short Term Goal 3 - Progress (Week 1): Met OT Short Term Goal 4 (Week 1): Pt will complete bathing with mod assist. OT Short Term Goal 4 - Progress (Week 1): Progressing toward goal Week 2:  OT Short Term Goal 1 (Week 2): Pt will bathe with MOD A to demo improved attention OT Short Term Goal 2 (Week 2): Pt will adhere to WB precautions during 75% of transfers OT Short Term Goal 3 (Week 2): Pt will clearly communicate wants and needs with mod cuing 50% of time OT Short Term Goal 4 (Week 2): Pt will complete toilet transfer with MIN A consistently  Skilled Therapeutic Interventions/Progress Updates:    1:1 Pt in bed asleep when arrived. Pt easily aroused. Engaged in orientation information including place/ environment, date and biographical information with total A and with external cues. Engaged at looking at family photos and could only identify 2/5 of the members of his family. Pt noted with difficulty with acuity and with tracking to the right. Pt reported being right handed but when given a pen wanted to use left hand and unable to engage right hand to use it. Pt came to EOB with supervision. Engaged in toileting including ambulating to the bathroom with Rw with min A with 2nd person directing him into the bathroom and providing directional cues. Pt able to navigate with extra time. Pt had  two bouts of diarrhea  and required total A to clean up. Pt ambulated again with RW with min A with extra time back to the bed and transitioned back into supine with supervision. Wrist restraints donned again and left resting in the bed.   Therapy Documentation Precautions:  Precautions Precautions: Fall Precaution Comments: PEG TUBE/ABD BINDER TO PROTECT Other Brace: prevalon boot  for wound on pt's R heel Restrictions Weight Bearing Restrictions: Yes RLE Weight Bearing: Non weight bearing Pain: No c/opain   Therapy/Group: Individual Therapy  Willeen Cass [facility] 05/22/2020, 3:32 PM

## 2020-05-23 ENCOUNTER — Inpatient Hospital Stay (HOSPITAL_COMMUNITY): Payer: Managed Care, Other (non HMO)

## 2020-05-23 ENCOUNTER — Inpatient Hospital Stay (HOSPITAL_COMMUNITY): Payer: Managed Care, Other (non HMO) | Admitting: Speech Pathology

## 2020-05-23 ENCOUNTER — Inpatient Hospital Stay (HOSPITAL_COMMUNITY): Payer: Managed Care, Other (non HMO) | Admitting: Physical Therapy

## 2020-05-23 DIAGNOSIS — S069X3S Unspecified intracranial injury with loss of consciousness of 1 hour to 5 hours 59 minutes, sequela: Secondary | ICD-10-CM | POA: Diagnosis not present

## 2020-05-23 DIAGNOSIS — R451 Restlessness and agitation: Secondary | ICD-10-CM

## 2020-05-23 DIAGNOSIS — R1312 Dysphagia, oropharyngeal phase: Secondary | ICD-10-CM | POA: Diagnosis not present

## 2020-05-23 LAB — CBC
HCT: 40 % (ref 39.0–52.0)
Hemoglobin: 12.4 g/dL — ABNORMAL LOW (ref 13.0–17.0)
MCH: 28.6 pg (ref 26.0–34.0)
MCHC: 31 g/dL (ref 30.0–36.0)
MCV: 92.2 fL (ref 80.0–100.0)
Platelets: 296 10*3/uL (ref 150–400)
RBC: 4.34 MIL/uL (ref 4.22–5.81)
RDW: 14 % (ref 11.5–15.5)
WBC: 7.7 10*3/uL (ref 4.0–10.5)
nRBC: 0 % (ref 0.0–0.2)

## 2020-05-23 LAB — COMPREHENSIVE METABOLIC PANEL
ALT: 28 U/L (ref 0–44)
AST: 17 U/L (ref 15–41)
Albumin: 2.8 g/dL — ABNORMAL LOW (ref 3.5–5.0)
Alkaline Phosphatase: 601 U/L — ABNORMAL HIGH (ref 38–126)
Anion gap: 10 (ref 5–15)
BUN: 11 mg/dL (ref 6–20)
CO2: 25 mmol/L (ref 22–32)
Calcium: 8.6 mg/dL — ABNORMAL LOW (ref 8.9–10.3)
Chloride: 104 mmol/L (ref 98–111)
Creatinine, Ser: 0.68 mg/dL (ref 0.61–1.24)
GFR, Estimated: >60 mL/min (ref >60)
Glucose, Bld: 103 mg/dL — ABNORMAL HIGH (ref 70–99)
Potassium: 3.8 mmol/L (ref 3.5–5.1)
Sodium: 139 mmol/L (ref 135–145)
Total Bilirubin: 0.7 mg/dL (ref 0.3–1.2)
Total Protein: 6.9 g/dL (ref 6.5–8.1)

## 2020-05-23 MED ORDER — METHOCARBAMOL 500 MG PO TABS
500.0000 mg | ORAL_TABLET | Freq: Four times a day (QID) | ORAL | Status: DC | PRN
  Administered 2020-05-24 – 2020-06-23 (×35): 500 mg via ORAL
  Filled 2020-05-23 (×37): qty 1

## 2020-05-23 MED ORDER — OXYCODONE HCL 5 MG/5ML PO SOLN
5.0000 mg | Freq: Four times a day (QID) | ORAL | Status: DC | PRN
  Administered 2020-05-23 – 2020-05-24 (×3): 10 mg via ORAL
  Administered 2020-05-24: 5 mg via ORAL
  Administered 2020-05-24 – 2020-05-25 (×2): 10 mg via ORAL
  Filled 2020-05-23 (×5): qty 10
  Filled 2020-05-23: qty 5

## 2020-05-23 MED ORDER — LORAZEPAM 1 MG PO TABS
1.0000 mg | ORAL_TABLET | Freq: Three times a day (TID) | ORAL | Status: DC | PRN
  Administered 2020-05-24 – 2020-05-26 (×6): 1 mg via ORAL
  Filled 2020-05-23 (×7): qty 1

## 2020-05-23 MED ORDER — QUETIAPINE FUMARATE 50 MG PO TABS
100.0000 mg | ORAL_TABLET | Freq: Every day | ORAL | Status: DC
  Administered 2020-05-23 – 2020-06-02 (×11): 100 mg via ORAL
  Filled 2020-05-23 (×3): qty 2
  Filled 2020-05-23: qty 4
  Filled 2020-05-23 (×7): qty 2

## 2020-05-23 MED ORDER — ACETAMINOPHEN 160 MG/5ML PO SOLN
650.0000 mg | Freq: Four times a day (QID) | ORAL | Status: DC
  Administered 2020-05-23: 650 mg via ORAL
  Filled 2020-05-23: qty 20.3

## 2020-05-23 MED ORDER — LORAZEPAM 2 MG/ML IJ SOLN
1.0000 mg | Freq: Three times a day (TID) | INTRAMUSCULAR | Status: DC | PRN
  Administered 2020-05-23: 1 mg via INTRAMUSCULAR
  Filled 2020-05-23: qty 1

## 2020-05-23 MED ORDER — ACETAMINOPHEN 325 MG PO TABS
650.0000 mg | ORAL_TABLET | Freq: Four times a day (QID) | ORAL | Status: DC | PRN
  Administered 2020-05-25 – 2020-06-12 (×15): 650 mg via ORAL
  Filled 2020-05-23 (×15): qty 2

## 2020-05-23 MED ORDER — CITALOPRAM HYDROBROMIDE 20 MG PO TABS
30.0000 mg | ORAL_TABLET | Freq: Every day | ORAL | Status: DC
  Administered 2020-05-23 – 2020-06-23 (×31): 30 mg via ORAL
  Filled 2020-05-23 (×33): qty 1

## 2020-05-23 MED ORDER — DIVALPROEX SODIUM 125 MG PO CSDR
500.0000 mg | DELAYED_RELEASE_CAPSULE | Freq: Three times a day (TID) | ORAL | Status: DC
  Administered 2020-05-23 – 2020-06-24 (×92): 500 mg via ORAL
  Filled 2020-05-23 (×95): qty 4

## 2020-05-23 MED ORDER — OLANZAPINE 5 MG PO TABS
7.5000 mg | ORAL_TABLET | Freq: Every day | ORAL | Status: DC
  Administered 2020-05-23 – 2020-06-07 (×16): 7.5 mg via ORAL
  Filled 2020-05-23 (×17): qty 1

## 2020-05-23 MED ORDER — OLANZAPINE 5 MG PO TABS
5.0000 mg | ORAL_TABLET | Freq: Every day | ORAL | Status: DC
  Administered 2020-05-24 – 2020-06-24 (×30): 5 mg via ORAL
  Filled 2020-05-23 (×33): qty 1

## 2020-05-23 MED ORDER — DIVALPROEX SODIUM 500 MG PO DR TAB: 500.0000 mg | DELAYED_RELEASE_TABLET | Freq: Three times a day (TID) | ORAL | Status: DC

## 2020-05-23 MED ORDER — FAMOTIDINE 20 MG PO TABS
20.0000 mg | ORAL_TABLET | Freq: Two times a day (BID) | ORAL | Status: DC
Start: 2020-05-23 — End: 2020-06-24
  Administered 2020-05-23 – 2020-06-24 (×61): 20 mg via ORAL
  Filled 2020-05-23 (×62): qty 1

## 2020-05-23 MED ORDER — PROPRANOLOL HCL 20 MG PO TABS
20.0000 mg | ORAL_TABLET | Freq: Three times a day (TID) | ORAL | Status: DC
  Administered 2020-05-23 – 2020-06-21 (×72): 20 mg via ORAL
  Filled 2020-05-23 (×83): qty 1
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
## 2020-05-23 NOTE — Progress Notes (Signed)
Patient asleep on intimal rounding, no apparent distress or discomfort , wife at bedside, reassurance an support provided. Remain in Enclosure bed per orders

## 2020-05-23 NOTE — Progress Notes (Signed)
Called to see patient this morning as he initially broke his PEG tube, but then fully removed his PEG tube.  Dr. Naaman Plummer immediately tried to replace his tube and was unable.  I came up shortly after and was also unable to get a track to place the PEG tube.  We tried a 50F foley as well and still unable.  The patient is taking in some POs and is staying somewhat agitated and has pulled this tube out prior to this incident as well.  His tube has been in for about 4 weeks.  Dr. Naaman Plummer asked if it would be safe to just leave the tube out.  Ideally we like for it to be in at least 6 weeks, but given the inability to replace this already, and it's been in for at least 4 weeks, and he is taking some PO, we decided to hold on replacement and try it out.  We had a long discussion about if he develops abdominal pain, fevers, leukocytosis, etc, to call us back.     Adam Bates 10:05 AM 05/23/2020
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
## 2020-05-23 NOTE — Progress Notes (Signed)
Speech Language Pathology Weekly Progress and Session Note  Patient Details  Name: Adam Bates MRN: [id number] Date of Birth: 12/06/1970  Beginning of progress report period: May 16, 2020 End of progress report period: May 23, 2020  Today's Date: 05/23/2020 SLP Individual Time: 1000-1055 SLP Individual Time Calculation (min): 55 min  Short Term Goals: Week 2: SLP Short Term Goal 1 (Week 2): Patient will consume current diet with minimal overt s/s of aspiration and overall Mod A verbal cues for use of swallowing compensatory strategies. SLP Short Term Goal 1 - Progress (Week 2): Met SLP Short Term Goal 2 (Week 2): Patient will consume trials of thin liquids via cup without overt s/s of aspiration over 3 sessions with Mod verbal cues for use of swallowing strategies to assess readiness for repeat MBS. SLP Short Term Goal 2 - Progress (Week 2): Not met SLP Short Term Goal 3 (Week 2): Patient will demonstrate sustained attention to functional tasks for 10 minutes with Mod verbal cues for redirection. SLP Short Term Goal 3 - Progress (Week 2): Not met SLP Short Term Goal 4 (Week 2): Patient will utilize external aids for orientation to time, place and situation with Max verbal and visual cues. SLP Short Term Goal 4 - Progress (Week 2): Not met SLP Short Term Goal 5 (Week 2): Patient will demonstrate functional problem solving for basic and familiar tasks with Mod verbal cues. SLP Short Term Goal 5 - Progress (Week 2): Not met    New Short Term Goals: Week 3: SLP Short Term Goal 1 (Week 3): Patient will consume current diet with minimal overt s/s of aspiration and overall Min A verbal cues for use of swallowing compensatory strategies. SLP Short Term Goal 2 (Week 3): Patient will demonstrate sustained attention to functional tasks for 10 minutes with Mod verbal cues for redirection. SLP Short Term Goal 3 (Week 3): Patient will utilize external aids for orientation to time, place and  situation with Max verbal and visual cues. SLP Short Term Goal 4 (Week 3): Patient will demonstrate functional problem solving for basic and familiar tasks with Mod verbal cues. SLP Short Term Goal 5 (Week 3): Patient will consume trials of thin liquids via cup without overt s/s of aspiration over 3 sessions with Mod verbal cues for use of swallowing strategies to assess readiness for repeat MBS.  Weekly Progress Updates: Patient has made minimal gains and has met 1 of 5 STGs this reporting period. Currently, patient demonstrates behaviors consistent with a [place] Level IV and continues to demonstrate severe language of confusion with intermittent agitation and restlessness. Because of current behaviors, patient requires overall Max-Total A multimodal cues to complete functional and familiar tasks safely in regards to orientation, attention, problem solving and overall safety. Patient is currently consuming Dys. 1 textures with nectar-thick liquids with minimal overt s/s of aspiration and overall Mod A verbal cues for use of swallowing compensatory strategies. Patient and family education ongoing. Patient would benefit from continued skilled SLP intervention to maximize his swallowing and cognitive functioning prior to discharge.     Intensity: Minumum of 1-2 x/day, 30 to 90 minutes Frequency: 3 to 5 out of 7 days Duration/Length of Stay: 06/11/19 Treatment/Interventions: Cognitive remediation/compensation;Environmental controls;Cueing hierarchy;Functional tasks;Therapeutic Activities;Therapeutic Exercise;Dysphagia/aspiration precaution training;Medication managment;Patient/family education   Daily Session  Skilled Therapeutic Interventions: Skilled treatment session focused on dysphagia and cognitive goals. Upon arrival, patient was naked while awake in the enclosure bed. SLP facilitated session by providing Max A verbal cues for  problem solving and attention while donning a brief, pants and shirt  while sitting EOB. Patient transferred to the [place] wheelchair with Min A. Patient performed basic self-care tasks at the sink with overall Mod A verbal cues for problem solving and thoroughness with tasks. Patient also completed a color sorting task from a field of 9 with overall Max A verbal cues for attention and problem solving. Patient also appeared to demonstrate a severe right inattention throughout task. Patient consumed minimal amounts of his breakfast meal of Dys. 1 textures with nectar-thick liquids without overt s/s of aspiration despite Max encouragement and cues for attention to task. Patient transferred back to enclosure bed at end of session. Continue with current plan of care.     Pain No reports or indications of pain   Therapy/Group: Individual Therapy  Pernell Lenoir 05/23/2020, 7:44 AM
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
## 2020-05-23 NOTE — Progress Notes (Signed)
Physical Therapy Session Note  Patient Details  Name: Adam Bates MRN: [id number] Date of Birth: 08/02/1970  Today's Date: 05/23/2020 PT Individual Time: 1505-1600 PT Individual Time Calculation (min): 55 min   Short Term Goals: Week 3:  PT Short Term Goal 1 (Week 2): Patient will perform basic transfers with min A while adhering to weight bearing precautions with cues consistently. PT Short Term Goal 2 (Week 2): Patient will ambulate 20 ft using LRAD with min A while adhering to weight bearing precautions. PT Short Term Goal 3 (Week 2): Pt will recall route to  room from primary utilized treatment area w/minimal cueing required.  Skilled Therapeutic Interventions/Progress Updates:    Patient asleep in enclosure bed, but easily aroused and cooperative to allow placement of new brief and pt applied pants in supine after initating with S.  Performed supine to sit with min A for R LE.  Patient transferred bed to TIS w/c with min A.  Tilted in w/c for safety and applied legrests.  Pushed in w/c to therapy gym.  Patient in parallel bars sit to stand with CGA and ambulated min A in bars using UE's for offloading R as tolerated 2 x 16'.  Patient needing hand over hand and mod cues to turn around first time, then min A and min cues to turn second time (distracted by looking in mirror first time, then turned opposite direction second time).  Patient seated in w/c to work on attention at [employer] to play with max cues for attention though pt asking at times if it was someone else's turn.  Not able to complete appropriate game play, but closer to moving pieces accurately after cues each time.  Staying on task with mod to max cues over 12- 15 minutes of game play with no one else in the gym.  Patient assisted in w/c to ortho gym and performed UBE x 7 minutes (4 min forward 3 min in reverse) level 1 while listening to classic rock and pt singing words to "[place]".  Pushed in w/c to room.   Patient performed teeth brushing at sink with set up assist.  Patient allowed to drink water as per water protocol without signs or symptoms of aspiration.  Patient performed stand step to bed holding PT's hands with mod A +2 for safety.  Assisted R LE into bed and left with enclosure bed secured.   Therapy Documentation Precautions:  Precautions Precautions: Fall Precaution Comments: no longer has PEG Other Brace: prevalon boot  for wound on pt's R heel Restrictions Weight Bearing Restrictions: Yes RLE Weight Bearing: Weight bearing as tolerated Other Position/Activity Restrictions: may progress to WBAT per ortho 05/21/20 Pain: Pain Assessment Faces Pain Scale: Hurts little more Pain Type: Acute pain Pain Location: Leg Pain Orientation: Right Pain Descriptors / Indicators: Guarding;Grimacing Pain Onset: With Activity Pain Intervention(s): Repositioned   Therapy/Group: Individual Therapy  Reginia Naas  [place], [place] 05/23/2020, 5:22 PM

## 2020-05-24 ENCOUNTER — Inpatient Hospital Stay (HOSPITAL_COMMUNITY): Payer: Managed Care, Other (non HMO) | Admitting: Occupational Therapy

## 2020-05-24 ENCOUNTER — Inpatient Hospital Stay (HOSPITAL_COMMUNITY): Payer: Managed Care, Other (non HMO) | Admitting: Speech Pathology

## 2020-05-24 DIAGNOSIS — S069X3S Unspecified intracranial injury with loss of consciousness of 1 hour to 5 hours 59 minutes, sequela: Secondary | ICD-10-CM | POA: Diagnosis not present

## 2020-05-24 LAB — CBC
HCT: 36.9 % — ABNORMAL LOW (ref 39.0–52.0)
Hemoglobin: 12.1 g/dL — ABNORMAL LOW (ref 13.0–17.0)
MCH: 29.8 pg (ref 26.0–34.0)
MCHC: 32.8 g/dL (ref 30.0–36.0)
MCV: 90.9 fL (ref 80.0–100.0)
Platelets: 265 10*3/uL (ref 150–400)
RBC: 4.06 MIL/uL — ABNORMAL LOW (ref 4.22–5.81)
RDW: 13.9 % (ref 11.5–15.5)
WBC: 5.6 10*3/uL (ref 4.0–10.5)
nRBC: 0 % (ref 0.0–0.2)

## 2020-05-24 NOTE — Progress Notes (Addendum)
Checked patient and he had pillow case around his neck tied. RN talked to patient and was able to take it out. Reminded NT not to place pillow case on pillow. NT placed pants on patient. . He keeps on taking off his brief and tying his  blanket all around his waist. PRN med given.

## 2020-05-24 NOTE — Plan of Care (Signed)
  Problem: RH BLADDER ELIMINATION Goal: RH STG MANAGE BLADDER WITH ASSISTANCE Description: STG Manage Bladder With mod I Assistance Outcome: Not Progressing; incontinence at times   Problem: RH SAFETY Goal: RH STG ADHERE TO SAFETY PRECAUTIONS W/ASSISTANCE/DEVICE Description: STG Adhere to Safety Precautions With min Assistance/Device. Outcome: Not Progressing; enclosure bed

## 2020-05-24 NOTE — Progress Notes (Signed)
Occupational Therapy Session Note  Patient Details  Name: Adam Bates MRN: [id number] Date of Birth: Sep 11, 1970  Today's Date: 05/24/2020 OT Individual Time: 2947-6546 OT Individual Time Calculation (min): 43 min   Short Term Goals: Week 2:  OT Short Term Goal 1 (Week 2): Pt will bathe with MOD A to demo improved attention OT Short Term Goal 2 (Week 2): Pt will adhere to WB precautions during 75% of transfers OT Short Term Goal 3 (Week 2): Pt will clearly communicate wants and needs with mod cuing 50% of time OT Short Term Goal 4 (Week 2): Pt will complete toilet transfer with MIN A consistently  Skilled Therapeutic Interventions/Progress Updates:    Patient greeted semi-reclined in enclosure bed awake and agreeable to OT treatment session. Pt had ripped off brief in bed. Pt completed bed mobility with min A, then min A stand-pivot to TIS wc. +2 present for safety throughout session. Pt brought over to the sink for BADL tasks. Pt impulsive to try to drink water at the sink and needed max cues not to drink water. Attempted UB bathing with OT able to wash back and under arms before pt getting restless and trying to stand and walk despite wc being pushed up to the sink. Max cues to return to sitting and try to don shirt. Pt attempted to don shirt like a cape and needed max A to appropriately thread shirt through head and arm holes. Pt trying to impulsively stand and walk multiple times requiring max A and +2 to safely place wc behind pt. Pt brought to dayroom in [place]. Had pt stand at the window with min A to look at the snow. Pt then brought to therapy table and OT tried to engage pt in large beading task. Pt often under and over reaching for large cube beads requiring hand over hand A and demonstrations to understand how to bead. Pt was able to thread 2 beads with max cues. OT then worked on visual scanning and color finding task. Pt was able to name and locate 1/4 colors on 3 trials. Pt returned  to room and completed stand-piovt back to bed with min A. Pt left semi-reclined in bed with nothing in bed with him and enclosure bed zipped.   Therapy Documentation Precautions:  Precautions Precautions: Fall Precaution Comments: no longer has PEG Other Brace: prevalon boot  for wound on pt's R heel Restrictions Weight Bearing Restrictions: Yes RLE Weight Bearing: Weight bearing as tolerated Other Position/Activity Restrictions: may progress to WBAT per ortho 05/21/20 Pain:  denies pain  Therapy/Group: Individual Therapy  Valma Cava 05/24/2020, 3:25 PM
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
## 2020-05-24 NOTE — Progress Notes (Signed)
Patient asleep most of the night. No distress noted.Currently in an enclosure bed. Patient took very little of his night medicine. Will continue to monitor.

## 2020-05-25 DIAGNOSIS — S069X3S Unspecified intracranial injury with loss of consciousness of 1 hour to 5 hours 59 minutes, sequela: Secondary | ICD-10-CM | POA: Diagnosis not present

## 2020-05-25 LAB — CBC
HCT: 37.4 % — ABNORMAL LOW (ref 39.0–52.0)
Hemoglobin: 11.7 g/dL — ABNORMAL LOW (ref 13.0–17.0)
MCH: 28.5 pg (ref 26.0–34.0)
MCHC: 31.3 g/dL (ref 30.0–36.0)
MCV: 91 fL (ref 80.0–100.0)
Platelets: 286 10*3/uL (ref 150–400)
RBC: 4.11 MIL/uL — ABNORMAL LOW (ref 4.22–5.81)
RDW: 13.8 % (ref 11.5–15.5)
WBC: 6.9 10*3/uL (ref 4.0–10.5)
nRBC: 0 % (ref 0.0–0.2)

## 2020-05-25 MED ORDER — OXYCODONE HCL 5 MG/5ML PO SOLN
5.0000 mg | Freq: Four times a day (QID) | ORAL | Status: DC | PRN
  Administered 2020-05-25 – 2020-05-26 (×3): 10 mg via ORAL
  Administered 2020-05-27 (×2): 5 mg via ORAL
  Administered 2020-05-27 – 2020-05-29 (×5): 10 mg via ORAL
  Administered 2020-05-30: 5 mg via ORAL
  Administered 2020-05-30 – 2020-05-31 (×2): 10 mg via ORAL
  Administered 2020-05-31 – 2020-06-01 (×2): 5 mg via ORAL
  Administered 2020-06-02 – 2020-06-04 (×6): 10 mg via ORAL
  Administered 2020-06-05: 5 mg via ORAL
  Filled 2020-05-25: qty 10
  Filled 2020-05-25 (×3): qty 5
  Filled 2020-05-25: qty 10
  Filled 2020-05-25: qty 5
  Filled 2020-05-25 (×2): qty 10
  Filled 2020-05-25: qty 5
  Filled 2020-05-25 (×11): qty 10
  Filled 2020-05-25: qty 5
  Filled 2020-05-25 (×2): qty 10

## 2020-05-25 NOTE — Progress Notes (Addendum)
Patient resting and calm with wife at the bedside reading him a book. Wife asking about small sutures to right hip area, questioning if it will dissolve or needs to be removed. RN will address issue with MD in am.  Patient took all his medication for the night. Noted some restlessness but slept well all night. Enclosure bed continues per order.
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
## 2020-05-25 NOTE — Plan of Care (Signed)
  Problem: Safety: Goal: Non-violent Restraint(s) Outcome: Progressing   Problem: Consults Goal: RH BRAIN INJURY PATIENT EDUCATION Description: Description: See Patient Education module for eduction specifics Outcome: Progressing Goal: Skin Care Protocol Initiated - if Braden Score 18 or less Description: If consults are not indicated, leave blank or document N/A Outcome: Progressing Goal: Nutrition Consult-if indicated Outcome: Progressing   Problem: RH BOWEL ELIMINATION Goal: RH STG MANAGE BOWEL WITH ASSISTANCE Description: STG Manage Bowel with mod I Assistance. Outcome: Progressing Goal: RH STG MANAGE BOWEL W/MEDICATION W/ASSISTANCE Description: STG Manage Bowel with Medication with mod I Assistance. Outcome: Progressing   Problem: RH BLADDER ELIMINATION Goal: RH STG MANAGE BLADDER WITH ASSISTANCE Description: STG Manage Bladder With mod I Assistance Outcome: Progressing   Problem: RH SKIN INTEGRITY Goal: RH STG MAINTAIN SKIN INTEGRITY WITH ASSISTANCE Description: STG Maintain Skin Integrity With mod I Assistance. Outcome: Progressing Goal: RH STG ABLE TO PERFORM INCISION/WOUND CARE W/ASSISTANCE Description: STG Able To Perform Incision/Wound Care With mod I Assistance. Outcome: Progressing   Problem: RH SAFETY Goal: RH STG ADHERE TO SAFETY PRECAUTIONS W/ASSISTANCE/DEVICE Description: STG Adhere to Safety Precautions With min Assistance/Device. Outcome: Progressing   Problem: RH COGNITION-NURSING Goal: RH STG USES MEMORY AIDS/STRATEGIES W/ASSIST TO PROBLEM SOLVE Description: STG Uses Memory Aids/Strategies With min Assistance to Problem Solve. Outcome: Progressing Goal: RH STG ANTICIPATES NEEDS/CALLS FOR ASSIST W/ASSIST/CUES Description: STG Anticipates Needs/Calls for Assist With min Assistance/Cues. Outcome: Progressing   Problem: RH PAIN MANAGEMENT Goal: RH STG PAIN MANAGED AT OR BELOW PT'S PAIN GOAL Description: Pain scale <4/10 Outcome: Progressing    Problem: RH KNOWLEDGE DEFICIT BRAIN INJURY Goal: RH STG INCREASE KNOWLEDGE OF SELF CARE AFTER BRAIN INJURY Outcome: Progressing

## 2020-05-26 ENCOUNTER — Inpatient Hospital Stay (HOSPITAL_COMMUNITY): Payer: Managed Care, Other (non HMO)

## 2020-05-26 DIAGNOSIS — I82409 Acute embolism and thrombosis of unspecified deep veins of unspecified lower extremity: Secondary | ICD-10-CM

## 2020-05-26 DIAGNOSIS — F919 Conduct disorder, unspecified: Secondary | ICD-10-CM | POA: Diagnosis not present

## 2020-05-26 DIAGNOSIS — S82201S Unspecified fracture of shaft of right tibia, sequela: Secondary | ICD-10-CM | POA: Diagnosis not present

## 2020-05-26 DIAGNOSIS — R1312 Dysphagia, oropharyngeal phase: Secondary | ICD-10-CM | POA: Diagnosis not present

## 2020-05-26 DIAGNOSIS — S069X3S Unspecified intracranial injury with loss of consciousness of 1 hour to 5 hours 59 minutes, sequela: Secondary | ICD-10-CM | POA: Diagnosis not present

## 2020-05-26 MED ORDER — LORAZEPAM 0.5 MG PO TABS: 0.5000 mg | ORAL_TABLET | Freq: Three times a day (TID) | ORAL | Status: DC | PRN

## 2020-05-26 MED ORDER — LORAZEPAM 0.5 MG PO TABS
0.5000 mg | ORAL_TABLET | Freq: Two times a day (BID) | ORAL | Status: DC | PRN
  Administered 2020-05-27 – 2020-06-14 (×6): 0.5 mg via ORAL
  Filled 2020-05-26 (×6): qty 1

## 2020-05-26 MED ORDER — LORAZEPAM 2 MG/ML IJ SOLN: 0.5000 mg | Freq: Three times a day (TID) | INTRAMUSCULAR | Status: DC | PRN

## 2020-05-26 MED ORDER — LORAZEPAM 2 MG/ML IJ SOLN
0.5000 mg | Freq: Two times a day (BID) | INTRAMUSCULAR | Status: DC | PRN
  Administered 2020-05-31 – 2020-06-03 (×2): 0.5 mg via INTRAMUSCULAR
  Filled 2020-05-26 (×2): qty 1

## 2020-05-26 NOTE — Progress Notes (Signed)
Lower extremity venous has been completed.   Preliminary results in CV Proc.   Abram Sander 05/26/2020 3:19 PM

## 2020-05-26 NOTE — Progress Notes (Signed)
Physical Therapy Weekly Progress Note  Patient Details  Name: Adam Bates MRN: [id number] Date of Birth: March 11, 1971  Beginning of progress report period: May 19, 2020 End of progress report period: May 26, 2020  Today's Date: 05/26/2020 PT Individual Time: 0800-0855 PT Individual Time Calculation (min): 55 min   Patient has met 2 of 3 short term goals.  Patient with stedy, but variable progress this week, limited by fluctuation in agitation levels due to over stimulation and elevated by restraints. Modified behavior plan for reduced stimulation and improved interactions with staff, patient also now in an enclosure bed with behaviors improving. He currently requires min A-supervision for bed mobility and transfers, and min A for gait in // bars 16 ft. Transfers and gait improving with increased weightbearing precautions for R lower extremity. Patient continues to demonstrate poor incite into deficits with intermittent restlessness and agitation with x1 fall with nursing staff this week. 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  Patient continues to demonstrate the following deficits muscle weakness and muscle joint tightness, decreased cardiorespiratoy endurance, motor apraxia, decreased coordination and decreased motor planning, decreased visual acuity and decreased visual perceptual skills, decreased attention to right, decreased initiation, decreased attention, decreased awareness, decreased problem solving, decreased safety awareness, decreased memory and delayed processing and decreased sitting balance, decreased standing balance, decreased postural control, decreased balance strategies and difficulty maintaining precautions and therefore will continue to benefit from skilled PT intervention to increase functional independence with mobility.  Patient progressing toward long term goals..  Continue plan of care.  PT Short Term Goals Week 2:   PT Short Term Goal 1 (Week 2): Patient will perform basic transfers with min A while adhering to weight bearing precautions with cues consistently. PT Short Term Goal 1 - Progress (Week 2): Met PT Short Term Goal 2 (Week 2): Patient will ambulate 20 ft using LRAD with min A while adhering to weight bearing precautions. PT Short Term Goal 2 - Progress (Week 2): Met PT Short Term Goal 3 (Week 2): Pt will recall route to  room from primary utilized treatment area w/minimal cueing required. PT Short Term Goal 3 - Progress (Week 2): Progressing toward goal Week 3:  PT Short Term Goal 1 (Week 3): Patient will perform basic transfers with CGA using LRAD. PT Short Term Goal 2 (Week 3): Patient will ambulate >100 ft using LRAD for improved reslessness and increased activity tolerance. PT Short Term Goal 3 (Week 3): Patient will attend to and perform dynamic balance activities >5 min with min A for balance with single UE support.  Skilled Therapeutic Interventions/Progress Updates:     Patient asleep in secured enclosure bed upon PT arrival. Patient slow to arouse and lethargic at beginning of session and patient agreeable to PT session. Patient denied pain during session.  Therapeutic Activity: Patient very slow to arouse and come to sitting. Came to sitting when MD entered the room for rounding. Patient tolerated 3 people in a dim quiet room. NT brought patient's breakfast tray. Bed Mobility: Patient performed supine to/from sit with CGA for balance/safety. Provided verbal cues for management of R lower extremity. Patient sat EOB with supervision to eat ~1/3 of his breakfast tray and drink a protein shake. Patient used his L hand and did not respond when asked why he was not using his R hand. Provided cues for encouragement of PO intake throughout. Patient without episodes of aspiration and appropriately declined any more food, stating he  was full. Patient then requested to go to the bathroom. Transfers:  Patient performed stand pivot bed<>w/c and w/c<>toilet with min A-CGA with use of arms rests and/or grab bar. He performed sit to/from stand x2 with CGA using RW. Provided verbal cues for placing R foot ahead for reduced weight bearing for pain management, completing turn or backing up to seat before sitting, and hand placement for safety. Patient was continent of bladder during toileting. Performed peri-care using a towel with set-up/supervision and donned a new incontinence brief with total A. Patient pulled off new incontinence brief x2 and tossed them across the room without explanation. Unable to explain why he did that after PT provided education on what the brief was for. Opted to leave brief off at this time to improve patient frustration level. While seated in the TIS w/c patient donned non-skid socks with total A, shorts with mod A, and a shirt with set-up assist. Draw string on shorts unable to tie. Attempted having patient change shorts for improved fit, patient no agreeable, stating "these are fine" several times. He then perseverated on attempting to tie the waist line in a knot. Patient able to be redirected with PT offering to hold his shorts up throughout session.   Gait Training:  Patient ambulated 20 feet x2 using RW with min A-CGA for AD management/steadying support. Ambulated with step-to gait pattern leading with R foot, antalgic gait on R with ~50% weight bearing, increased use of upper extremities to off weight his R leg, and forward flexed posture. Provided verbal cues for proximity to RW, controlled stepping for safe gait speed, and visual scanning. Patient ambulated in room due to elevated signs of agitation with toileting. Maintained low stim environment with lights off and only 2 people in the room throughout without improved behaviors with mobility.   During rest breaks between trials attempted to have the patient read signs and identify colors. Provided ample time for cognitive  processing and mod-max cues. Patient able to read large print on L side of paper, but unable to read smaller print on L or R side of the paper. Patient with poor visual scanning R and L to locate signs of the wall. Unable to tell therapist colors of signs or objects in the room and when placed in front of him. Able to say "yes" or "no" accurately when asked if the object was a certain color.   While ambulating on second trial, the patient appropriately requested to return to the bed to lie down due to fatigue.   Patient in secured enclosure bed at end of session with breaks locked, bed alarm set, and all needs within reach.    Therapy Documentation Precautions:  Precautions Precautions: Fall Precaution Comments: no longer has PEG Other Brace: prevalon boot  for wound on pt's R heel Restrictions Weight Bearing Restrictions: Yes RLE Weight Bearing: Weight bearing as tolerated Other Position/Activity Restrictions: may progress to WBAT per ortho 05/21/20  Therapy/Group: Individual Therapy  Marilyn Wing L Noela Brothers PT, DPT  05/26/2020, 3:55 PM

## 2020-05-26 NOTE — Progress Notes (Signed)
Physical Therapy Session Note  Patient Details  Name: Adam Bates MRN: [id number] Date of Birth: [birth year]  Today's Date: 05/26/2020 PT Individual Time: 1000-1100 PT Individual Time Calculation (min): 60 min   Short Term Goals: Week 1:  PT Short Term Goal 1 (Week 1): Pt will perform sit to stand w/RW and adhere to wbing precautions w/min assist PT Short Term Goal 1 - Progress (Week 1): Partly met PT Short Term Goal 2 (Week 1): stand pivot bed to/from wc w/RW and min assist adhering to wbing precautions PT Short Term Goal 2 - Progress (Week 1): Partly met PT Short Term Goal 3 (Week 1): pt will tolerate initiation of gait training PT Short Term Goal 3 - Progress (Week 1): Met PT Short Term Goal 4 (Week 1): Pt will recall route to  room from primary utilized treatment area w/minimal cueing required. PT Short Term Goal 4 - Progress (Week 1): Progressing toward goal Week 2:  PT Short Term Goal 1 (Week 2): Patient will perform basic transfers with min A while adhering to weight bearing precautions with cues consistently. PT Short Term Goal 2 (Week 2): Patient will ambulate 20 ft using LRAD with min A while adhering to weight bearing precautions. PT Short Term Goal 3 (Week 2): Pt will recall route to  room from primary utilized treatment area w/minimal cueing required. Week 3:     Skilled Therapeutic Interventions/Progress Updates:    PAIN denies pain when asked, but c//o R hip pain and low back pain during session.  Repositioning and rest provided as needed.  See below for soft tissue mobilization.  Pt initially awake, alert, confused in Posey Bed.  Supine to sit w/supervision.   Sit to stand to RW w/cues for initiation, sequencing.  Poor safety awareness and signficant motor planning issues w/task require mult efforts but performs ultimately w/min assist.  Short distance gait to wc approx 40f w/min assist, max cues to complete transfer to wc safely.  Nursing in to provide meds.  Therapist  performed soft tissue mobilization to R quads/ITB w/internittent AROM performed., sifnificant restrictions/tautness of tissue likely contribute to poor quad function.  Pt transported to hallway, very few staff members in hall at time. Sit to stand w/mod multimodal cueing for intitiation/planning/completion of transition.  Gait trials as follows: [id number] 1 [id number] 1 [id number] Pt w/min assist for management of RW w/task, limited wbing thru RLE, step to gait pattern, limited knee extension RLE terminal swing thru sance w/decreased quad activation, no buckling thrustance w/limited wbing.  Occasionally leans forward onto elbows during gait then chooses to proceed despite being offered opportunity to rest in sitting.  Therapist cued pt w/familiar songs, pt occasionally able to join into proper verse (Eagles/Elton John) but short recall. Pt was able to perform simple mental math during rest breaks w/approx 50% accuracy, when incorrect consistently off by one digit.  Pt frequently reoriented to date/place/situation during session w/very limited carryover.  Pt stand pivot transfer wc to Posey bed w/RW and min assist.  Sit to supine w/min assist for RLE due to limited knee flexion/clearance of posey bed net.  Pt repositioned himself comfortably.  Pt calling out for his daughter, reoriented pt again. Pt appeared very fatigued at end of session, encouraged to rest before lunch. Pt calm.  Pt left secured in PoseyBed w/needs in reach.     Therapy Documentation Precautions:  Precautions Precautions: Fall Precaution Comments: no longer has PEG Other Brace: prevalon boot  for wound  on pt's R heel Restrictions Weight Bearing Restrictions: Yes RLE Weight Bearing: Weight bearing as tolerated Other Position/Activity Restrictions: may progress to WBAT per ortho 05/21/20    Therapy/Group: Individual Therapy  Callie Fielding, [place] 05/26/2020, 12:30 PM

## 2020-05-26 NOTE — Progress Notes (Signed)
Occupational Therapy Session Note  Patient Details  Name: Adam Bates MRN: [id number] Date of Birth: February 03, 1971  Today's Date: 05/26/2020 OT Individual Time: 1130-1155 OT Individual Time Calculation (min): 25 min   Session 2: OT Individual Time: 1420-1502 OT Individual Time Calculation (min): 42 min    Short Term Goals: Week 2:  OT Short Term Goal 1 (Week 2): Pt will bathe with MOD A to demo improved attention OT Short Term Goal 2 (Week 2): Pt will adhere to WB precautions during 75% of transfers OT Short Term Goal 3 (Week 2): Pt will clearly communicate wants and needs with mod cuing 50% of time OT Short Term Goal 4 (Week 2): Pt will complete toilet transfer with MIN A consistently  Skilled Therapeutic Interventions/Progress Updates:    Session 1: Pt received supine in enclosure bed, crying about his stomach hurting. Enclosure bed opened and pt came to EOB with min A. He completed stand pivot transfer to the [facility] with min A. He required min cueing for attention to task. He voided BM and required cueing for completing peri hygiene, as pt attempted to pull up shorts first. Min A for hygiene for thoroughness. He returned to sitting EOB with min A. With set up of toothpaste and toothbrush pt completed oral care with no further cueing and improvement in motor planning. Pt requested something to drink and he was given 1 nectar thick apple juice and 1 nectar thick water out of a cup. Min cueing for slow, small sips, no s/s of aspiration. Pt returned to supine and looked at pictures of his family with OT guiding activity. He was unable to name any of his children and had severe language of confusion. He was slightly inappropriate with OT but much improved from last week. Pt was left supine in his enclosure bed with all needs met.   Session 2:  Pt received supine in enclosure bed, perched at the top and pointing to large urine puddle in bed. He had also ripped his incontinence brief to shreds in  the bed. Pt transferred to TIS w/c with min A. He was able to maintain seated and engage in "conversation" with therapy tech (severe language of confusion) while his bed was cleaned. He was taken to the sink where he required max multimodal cueing for initiation and completion of any bathing activity. He demonstrated significant motor planning deficits throughout ADLs. Pt was able to don shirt with set up assist only. Min A to don pants. Pt consumed 2 nectar thick beverages sitting EOB. He was taken outside of room to challenge sustained attention and increase sensory demands. Pt had no increase in agitation outside of room but did start falling asleep sitting up so he was escorted back to his room. It took 10 min to cue pt to get back into his bed, pt not agitated but simply declining transfer. Eventually he spontaneously initiated transfer himself with min A. Ultrasound tech entering room, discussed low stim environment and assisted pt with doffing pants. Pt left in room with tech and OT notified another NT to enter room to assist if needed.    Therapy Documentation Precautions:  Precautions Precautions: Fall Precaution Comments: no longer has PEG Other Brace: prevalon boot  for wound on pt's R heel Restrictions Weight Bearing Restrictions: Yes RLE Weight Bearing: Weight bearing as tolerated Other Position/Activity Restrictions: may progress to WBAT per ortho 05/21/20   Therapy/Group: Individual Therapy  Curtis Sites 05/26/2020, 7:00 AM
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
## 2020-05-27 ENCOUNTER — Inpatient Hospital Stay (HOSPITAL_COMMUNITY): Payer: Managed Care, Other (non HMO)

## 2020-05-27 DIAGNOSIS — S82201S Unspecified fracture of shaft of right tibia, sequela: Secondary | ICD-10-CM | POA: Diagnosis not present

## 2020-05-27 DIAGNOSIS — R1312 Dysphagia, oropharyngeal phase: Secondary | ICD-10-CM | POA: Diagnosis not present

## 2020-05-27 DIAGNOSIS — F919 Conduct disorder, unspecified: Secondary | ICD-10-CM | POA: Diagnosis not present

## 2020-05-27 DIAGNOSIS — S069X3S Unspecified intracranial injury with loss of consciousness of 1 hour to 5 hours 59 minutes, sequela: Secondary | ICD-10-CM | POA: Diagnosis not present

## 2020-05-27 NOTE — Progress Notes (Signed)
Occupational Therapy Session Note  Patient Details  Name: Adam Bates MRN: [id number] Date of Birth: 1971-02-03  Today's Date: 05/27/2020 OT Individual Time: 1415-1450 OT Individual Time Calculation (min): 35 min    Short Term Goals: Week 3:  OT Short Term Goal 1 (Week 3): Pt will remain appropriate with OT staff 90% of interactions OT Short Term Goal 2 (Week 3): Pt will complete grooming tasks with in vc for motor planning OT Short Term Goal 3 (Week 3): Pt will require no more than mod cueing to initiate basic ADLs  Skilled Therapeutic Interventions/Progress Updates:    Pt received supine in the enclosure bed, ready to get OOB and agreeable to finish eating lunch. He quickly transferred to his TIS w/c, unable to follow commands for safer transfer technique. +2 required for safety in equipment management and quick adjustments throughout session. Pt with severe confusion, language of confusion, and increased inappropriate sexual comments to staff this session. He was able to feed himself a serving of pureed fruit with only mod cueing for attention to task. He stood from [employer] and used RW with cueing for ambulatory transfer to the bathroom. Min A overall for balance support. He ditched Rw at the door of the bathroom and doffed all his clothes before transferred to the toilet in very unsafe manner. He had no void. He got up from toilet and required max redirection/cueing to make it back to his enclosure bed. With pt safely back in bed and resting, final 10 min of session missed to reduce increasing any further agitation. Pt left supine with enclosure bed secured.   Therapy Documentation Precautions:  Precautions Precautions: Fall Precaution Comments: no longer has PEG Other Brace: prevalon boot  for wound on pt's R heel Restrictions Weight Bearing Restrictions: No RLE Weight Bearing: Weight bearing as tolerated Other Position/Activity Restrictions: may progress to WBAT per ortho  05/21/20  Therapy/Group: Individual Therapy  Curtis Sites 05/27/2020, 1:00 PM

## 2020-05-27 NOTE — Progress Notes (Addendum)
Patient ID: Adam Bates, male   DOB: 1970/12/15, 50 y.o.   MRN: [id number]  SW spoke with pt wife Adam Bates [phone]) to provide updates from team conference, and no changes in d/c date at this time. Wife was concerned about continued agitation and no change in d/c date. SW informed pt wife we continue to evaluate him daily and each week at our team conference so it is possible it could change. Wife would also like to know when pt will have another swallow study. SW informed will ask speech therapy when his next swallow study will be scheduled.   *SW followed up with pt wife to inform pt is currently on water protocol however has silent aspiration, and they would like for pt cognition to improve since he lacks awareness of bolus during trials.  [name], MSW, [place] Office: [phone] Cell: [phone] Fax: [phone]
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
## 2020-05-27 NOTE — Progress Notes (Signed)
Patient was agitated at this time wanting to get out of the enclosure bed; medicated and placed soft rock  music to calm him down. Patient was singing after.

## 2020-05-27 NOTE — Plan of Care (Signed)
  Problem: Safety: Goal: Non-violent Restraint(s) Outcome: Not Progressing; ENCLOSURE BED Problem: RH BOWEL ELIMINATION Goal: RH STG MANAGE BOWEL WITH ASSISTANCE Description: STG Manage Bowel with mod I Assistance. Outcome: Not Progressing; INC AT TIMES Problem: RH BLADDER ELIMINATION Goal: RH STG MANAGE BLADDER WITH ASSISTANCE Description: STG Manage Bladder With mod I Assistance Outcome: Not Progressing; INC AT TIMES Problem: RH SAFETY Goal: RH STG ADHERE TO SAFETY PRECAUTIONS W/ASSISTANCE/DEVICE Description: STG Adhere to Safety Precautions With min Assistance/Device. Outcome: Not Progressing; ENCLOSURE BED Problem: RH KNOWLEDGE DEFICIT BRAIN INJURY Goal: RH STG INCREASE KNOWLEDGE OF SELF CARE AFTER BRAIN INJURY Outcome: Not Progressing; CONFUSION

## 2020-05-27 NOTE — Progress Notes (Signed)
Physical Therapy Session Note  Patient Details  Name: Adam Bates MRN: [id number] Date of Birth: 05-Jan-1971  Today's Date: 05/27/2020 PT Individual Time:0900-1000 and [id number] PT Individual Time Calculation (min): 60 min and 45 min   Short Term Goals: Week 3:  PT Short Term Goal 1 (Week 3): Patient will perform basic transfers with CGA using LRAD. PT Short Term Goal 2 (Week 3): Patient will ambulate >100 ft using LRAD for improved reslessness and increased activity tolerance. PT Short Term Goal 3 (Week 3): Patient will attend to and perform dynamic balance activities >5 min with min A for balance with single UE support.  Skilled Therapeutic Interventions/Progress Updates:     Session 1: Patient in secured enclosure bed with RN in the room to administer medications upon PT arrival. Patient alert and agreeable to PT session. Patient reported moderate R knee pain during session, RN made aware and provided pain medicine during session. PT provided repositioning, rest breaks, and distraction as pain interventions throughout session.   Patient with improved attention and increased appropriate behaviors this session. Tolerated 30 min out of the room in the Day room during therapy and appropriately asked to leave and agreeable to return to the room. Continues to present with Intermittent language of confusion, perseveration, and poor recall of new information throughout session  Therapeutic Activity: Bed Mobility: Patient performed supine to/from sit with supervision x2. Provided verbal cues for safety awareness and management of R lower extremity. Transfers: Patient performed stand pivot bed<>TIS w/c without an AD and sit to/from stand x5 from the bed, TIS w/c, and toilet using a RW with CGA-min A for balance and management of RW. Provided verbal cues for hand placement, scooting forward, and management of impulsivity with clear instructions to wait for the therapist to be ready before standing,  patient followed this cue well throughout session. Patient was continent of bladder during toileting. Performed lower body clothing management with min A, required mod A for balance and cues for safety due to patient letting go with both hands to pull up his pants.   Gait Training:  Patient ambulated with RW 50 feet with 1-180 deg turn around a cone and 50 feet weaving between 5 cones down and back following PT instructions and demonstration for both tasks. Patient then instructed to perform the last task again without therapist providing cues, patient ambulated 50 feet walking with RW over cones without weaving, turning 180 degrees around the last cone. Patient required CGA with all gait activities. Ambulated with step-to gait pattern leading with R, antalgic gait with knee in flexion throughout on R.   Between gait trials patient performed simple single and double digit addition with 9/10 correct responses, performed single digit multiplication, all answers for multiples of 1 and 2 correct, incorrect with higher numbers. Patient also recited the ABCs with the therapist, then independently. Attempted reciting the alphabet backwards and the patient unable to problem solve how to determine the letter prior to the last and perseverated on stating the last for letters of the alphabet "wxyz."  Patient declined toileting when offered prior to end of session, however, preceded to come to sitting EOB. Waited for therapist to retrieve RW with cues, then ambulated to/from the bathroom with min A for management of RW. Demonstrated poor safety awareness on return, leaning on elbows to walk back to bed. Provided cues and facilitation for AD management and safety.   Wheelchair Mobility:  Patient was transported in the [place] w/c with total A to/from  the Day room for energy conservation and time management.  Manual Therapy/Ther-Ex: -R knee extension stretch with prolonged pressure, placed towel roll under ankle for  increased ROM, able to achieve neutral extension with over pressure -R patellar mobilizations superior/inferior and medial/lateral with good mobility in all directions except inferior with some restriction -R knee and hip flexion with reciprocal inhibition, activation of extensors x5 sec and relaxation of flexors x10 sec 2x5 (hip flexion grossly at 90 deg and knee flexion grossly at 45 deg after) -R hamstring stretch with reciprocal inhibition, activation of hamstrings x5 sec and relaxation x10 sec x3 -R DF stretch with mobilization at talus for increased joint mobility 2x30 sec -soft tissue mobilization and lymphatic massage to R quads and gastroc for muscle tension and edema management  Patient listened to soft rock on the computer and tolerated manual therapy well.  Patient in secured enclosure bed at end of session with breaks locked, Telesitter in the room, and all needs within reach.   Session 2: Patient in secured enclosure bed with RN in the room to provide patient with medications upon PT arrival. Patient alert and agreeable to PT session. Patient reporting same moderate R knee pain this session, RN aware and provided pain meds during session. NT dropped off patient's lunch tray at beginning of session.  Noted wet spot on the floor next to the patient's bed. Unable to determine source, but patient indicated that he may have voided onto the floor from the bed. Patient with increased language of confusion throughout session.   Therapeutic Activity: Bed Mobility: Patient performed supine to/from sit as above.  Transfers: Patient performed stand pivot bed<>TIS w/c and sit to/from stand from the TIS w/c and toilet as above. Patient sat in the TIS w/c >15 min focused on eating lunch in low stim environment with only the therapist in the room to reduce distration. Required cues for attention to task, safe swallowing technique and small bite sizes. Patient used his L hand throughout and required  cues to use a spoon to eat his magic cup, rather than eat it from the cup with his mouth. Patient ate ~1/4 of his plate with pureed potatoes and meat, drank >50% of his thickened chocolate shake/dairy supplemet, and drank 2 nectar thick juices. Patient then declined eating anything else and requesting to go to the bathroom. Required increased cues to wait for therapist to provide patient with a RW, but did follow cues. Patient performed an ambulatory transfer to/from the bathroom with min A, required assist and cues for safe management of AD and technique, noted increased challenge with balance on incline into the bathroom this session. Patient was continent of bowl and bladder during toileting. Provided supervision for safety and cues for relaxation instead of straining during BM. Patient insistent on wiping himself this session provided patient with toilet paper x2 and damp wash cloths x2 and patient wiped appropriately following cues to not place the wash cloths in the toilet. PT performed follow-up with for thoroughness after. Patient performed lower body clothing management as above.   Patient in secured enclosure bed at end of session with breaks locked, Telesitter in place, and all needs within reach.    Therapy Documentation Precautions:  Precautions Precautions: Fall Precaution Comments: no longer has PEG Other Brace: prevalon boot  for wound on pt's R heel Restrictions Weight Bearing Restrictions: No RLE Weight Bearing: Weight bearing as tolerated Other Position/Activity Restrictions: may progress to WBAT per ortho 05/21/20   Therapy/Group: Individual Therapy  Catlin Doria L Meryle Pugmire PT, DPT  05/27/2020, 7:33 PM

## 2020-05-27 NOTE — Progress Notes (Signed)
Occupational Therapy Weekly Progress Note  Patient Details  Name: Adam Bates MRN: [id number] Date of Birth: 1971/01/26  Beginning of progress report period: May 20, 2020 End of progress report period: May 27, 2020  Today's Date: 05/27/2020 OT Individual Time: 9449-6759 OT Individual Time Calculation (min): 45 min    Patient has met 2 of 4 short term goals.  Pt continues to demonstrate behaviors consistent with a RLAS IV. His agitation and ability to be redirected fluctuates day to day. When he is able to be redirected to tasks, he can complete UB ADLs with min A and mod A for LB.  Patient continues to demonstrate the following deficits: muscle weakness, decreased cardiorespiratoy endurance, decreased initiation, decreased attention, decreased awareness, decreased problem solving, decreased safety awareness, decreased memory and delayed processing and decreased sitting balance, decreased standing balance, decreased balance strategies and difficulty maintaining precautions and therefore will continue to benefit from skilled OT intervention to enhance overall performance with BADL and Reduce care partner burden.  Patient progressing toward long term goals..  Continue plan of care.  OT Short Term Goals Week 2:  OT Short Term Goal 1 (Week 2): Pt will bathe with MOD A to demo improved attention OT Short Term Goal 1 - Progress (Week 2): Progressing toward goal OT Short Term Goal 2 (Week 2): Pt will adhere to WB precautions during 75% of transfers OT Short Term Goal 2 - Progress (Week 2): Met OT Short Term Goal 3 (Week 2): Pt will clearly communicate wants and needs with mod cuing 50% of time OT Short Term Goal 3 - Progress (Week 2): Not met OT Short Term Goal 4 (Week 2): Pt will complete toilet transfer with MIN A consistently OT Short Term Goal 4 - Progress (Week 2): Met Week 3:  OT Short Term Goal 1 (Week 3): Pt will remain appropriate with OT staff 90% of interactions OT Short  Term Goal 2 (Week 3): Pt will complete grooming tasks with in vc for motor planning OT Short Term Goal 3 (Week 3): Pt will require no more than mod cueing to initiate basic ADLs  Skilled Therapeutic Interventions/Progress Updates:   Pt received supine in enclosure bed with no c/o pain. Pt anxious to get OOB and immediately transferring to the chair when the enclosure bed was opened. He completed several stand pivot transfer with min A. He reports pain when bearing weight through his RLE. He sat in the TIS w/c and requested to call his mother, perseverating on this for quite a while. OT dialed his wife's phone number and allowed him to talk to her to provide orientation and hopefully resolve some perseveration on family- which it did. Pt then required max cueing for attending to eating his lunch. D/t attention deficits pt required OT to load spoon with D1. Pt declined any further activity, and requiring mod cueing for return to bed. Language of confusion present throughout session. Pt was left supine in enclosure bed.    Therapy Documentation Precautions:  Precautions Precautions: Fall Precaution Comments: no longer has PEG Other Brace: prevalon boot  for wound on pt's R heel Restrictions Weight Bearing Restrictions: No RLE Weight Bearing: Weight bearing as tolerated Other Position/Activity Restrictions: may progress to WBAT per ortho 05/21/20   Therapy/Group: Individual Therapy  Adam Bates 05/27/2020, 6:31 AM

## 2020-05-28 ENCOUNTER — Inpatient Hospital Stay (HOSPITAL_COMMUNITY): Payer: Managed Care, Other (non HMO)

## 2020-05-28 DIAGNOSIS — S82201S Unspecified fracture of shaft of right tibia, sequela: Secondary | ICD-10-CM | POA: Diagnosis not present

## 2020-05-28 DIAGNOSIS — F919 Conduct disorder, unspecified: Secondary | ICD-10-CM | POA: Diagnosis not present

## 2020-05-28 DIAGNOSIS — R1312 Dysphagia, oropharyngeal phase: Secondary | ICD-10-CM | POA: Diagnosis not present

## 2020-05-28 DIAGNOSIS — S069X3S Unspecified intracranial injury with loss of consciousness of 1 hour to 5 hours 59 minutes, sequela: Secondary | ICD-10-CM | POA: Diagnosis not present

## 2020-05-28 LAB — VALPROIC ACID LEVEL: Valproic Acid Lvl: 60 ug/mL (ref 50.0–100.0)

## 2020-05-28 LAB — BASIC METABOLIC PANEL
Anion gap: 8 (ref 5–15)
BUN: 9 mg/dL (ref 6–20)
CO2: 30 mmol/L (ref 22–32)
Calcium: 8.8 mg/dL — ABNORMAL LOW (ref 8.9–10.3)
Chloride: 100 mmol/L (ref 98–111)
Creatinine, Ser: 0.76 mg/dL (ref 0.61–1.24)
GFR, Estimated: >60 mL/min (ref >60)
Glucose, Bld: 107 mg/dL — ABNORMAL HIGH (ref 70–99)
Potassium: 4 mmol/L (ref 3.5–5.1)
Sodium: 138 mmol/L (ref 135–145)

## 2020-05-28 LAB — URINALYSIS, COMPLETE (UACMP) WITH MICROSCOPIC
Bacteria, UA: NONE SEEN
Bilirubin Urine: NEGATIVE
Glucose, UA: NEGATIVE mg/dL
Hgb urine dipstick: NEGATIVE
Ketones, ur: 5 mg/dL — AB
Leukocytes,Ua: NEGATIVE
Nitrite: POSITIVE — AB
Protein, ur: 30 mg/dL — AB
Specific Gravity, Urine: 1.023 (ref 1.005–1.030)
pH: 8 (ref 5.0–8.0)

## 2020-05-28 LAB — PREALBUMIN: Prealbumin: 17.3 mg/dL — ABNORMAL LOW (ref 18–38)

## 2020-05-28 MED ORDER — BACITRACIN ZINC 500 UNIT/GM EX OINT
TOPICAL_OINTMENT | Freq: Two times a day (BID) | CUTANEOUS | Status: DC
  Administered 2020-05-30 – 2020-06-14 (×3): 1 via TOPICAL
  Filled 2020-05-28 (×7): qty 28.35

## 2020-05-28 MED ORDER — SULFAMETHOXAZOLE-TRIMETHOPRIM 800-160 MG PO TABS
1.0000 | ORAL_TABLET | Freq: Two times a day (BID) | ORAL | Status: DC
  Administered 2020-05-28 – 2020-06-08 (×23): 1 via ORAL
  Filled 2020-05-28 (×23): qty 1

## 2020-05-28 MED ORDER — ENOXAPARIN SODIUM 40 MG/0.4ML ~~LOC~~ SOLN
40.0000 mg | SUBCUTANEOUS | Status: DC
  Administered 2020-05-28 – 2020-06-23 (×25): 40 mg via SUBCUTANEOUS
  Filled 2020-05-28 (×27): qty 0.4

## 2020-05-28 MED ORDER — METHYLPHENIDATE HCL 5 MG PO TABS
5.0000 mg | ORAL_TABLET | Freq: Two times a day (BID) | ORAL | Status: DC
  Administered 2020-05-28 – 2020-06-01 (×10): 5 mg via ORAL
  Filled 2020-05-28 (×9): qty 1

## 2020-05-28 MED ORDER — ENSURE MAX PROTEIN PO LIQD
11.0000 [oz_av] | Freq: Every day | ORAL | Status: DC
  Administered 2020-05-28 – 2020-06-06 (×8): 11 [oz_av] via ORAL
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
## 2020-05-28 NOTE — Progress Notes (Signed)
Physical Therapy Session Note  Patient Details  Name: Adam Bates MRN: [id number] Date of Birth: 1971/03/12  Today's Date: 05/28/2020 PT Individual Time:0800-0900 and 1410-1500 PT Individual Time Calculation (min): 60 min and 50 min   Short Term Goals: Week 3:  PT Short Term Goal 1 (Week 3): Patient will perform basic transfers with CGA using LRAD. PT Short Term Goal 2 (Week 3): Patient will ambulate >100 ft using LRAD for improved reslessness and increased activity tolerance. PT Short Term Goal 3 (Week 3): Patient will attend to and perform dynamic balance activities >5 min with min A for balance with single UE support.  Skilled Therapeutic Interventions/Progress Updates:     Session 1: Patient in secured enclosure bed in quadruped position due to urine incontinence in the bed upon PT arrival. Patient was alert and agreeable to PT session and initially agreeable to ambulating to the bathroom for peri-care and upper and lower body bathing. Patient indicated right lower extremity pain with grimacing and guarding throughout session RN made aware and provided pain medicine during session PT provided repositioning rest breaks and distraction as pain interventions throughout session.  Bed Mobility: Patient performed supine to/from sit with supervision. Provided cues for safety awareness due to impulsivity.  Gait Training: Patient initiated ambulation with a RW to the bathroom, however ambulated to the door to his room, opened the door and ambulated into the hallway, despite heavy cues and redirection to return to the bathroom. Patient very difficult to redirect once in the hallway, he ambulated outside his door following cues for turning around however unable to locate his room door. Patient turned into the wall outside his room and unable to motor plan or follow cues greater than two minutes in order to move away from the wall and back to the room. Provided cues for standing still and  performing diaphragmatic breathing and patient was eventually able to return to the room and ambulate into the bathroom. Patient performed all gait with contact guard to min A for safety and balance. Ambulated with Step 2 gait pattern leading with right lower extremity, antalgic gate on right, with decreased weight shift and increased right knee flexion in stance.  Bathing/Dressing: Patient sat on a bariatric bedside commode in the bathroom, doffed shorts and shirt With supervision, performed upper and lower body bathing and peri care with total assist due to poor attention to task and decreased frustration tolerance. Patient then donned paper scrub pants with min assist for right lower extremity and a T-shirt with set up assist. Patient both internally and externally distracted throughout, requiring frequent cues and redirection to tasks.  Therapeutic Activity: Patient ambulated back to his tilt in space wheelchair, as above. Once seated, he was agreeable to eat breakfast. Patient ate a few bites of pureed diet but complained about the texture and taste and declined eating anymore. Patient then ate one cup of applesauce and drank nectar thick apple juice. Patient required mod to Max cues to attend to eating, utilized his left hand for utensil management throughout, it required increased encouragement due to dislike of texture and taste of food and drinks, SLP made aware after session.  Patient then perseverated on leaving the room however displayed behaviors consistent with overstimulation. Patient able to be redirected to in room activities in low stem environment while listening to soft rock. Patient returned to bed performing stand pivot without an assistive device with min assist and cues for safety.  Patient in secured enclosure bed with brakes locked,  telesitter in place, and all needs in reach at end of session.  Session 2: Patient in secured enclosure bed in the room upon PT arrival. Patient  alert and agreeable to PT session with bright affect and improved mood this session. Patient denied pain during session.  Patient tolerated greater than 30 minutes in the dayroom in a busy environment during session. Patient with poor attention but improved mood and frustration tolerance this session.  Bed Mobility: Patient performed bed mobility as in previous session.  Transfers: Patient performed stand pivot to/from bed with min assist to contact guard for safety and balance. Provided cues for safe hand placement. Patient performed sit to stand transfers x4 with contact guard assist with and without RW. Provided cues for impulse control, hand placement on RW, and reaching back to sit.  Gait Training: Patient ambulated 50 feet performing one 180 degree turn around a cone with Max cues for attention to task in a busy environment. Ambulated as above.  Neuromuscular Re-education: Patient performed standing balance 3x1-2 min. Performed cognitive tasks while in standing including grouping items by color, identifying colors, letters, and numbers. Patient with approximately 50% accuracy with activities with poor attention to task in a busy environment in the dayroom.  Patient ate two magic cups throughout session during rest breaks and as motivation to return to the room. Patient required significant time, >10 min, To follow cues to return to the bed. Patient perseverated on leaving the room with mild agitation when asked to return to the bed. Patient finally agreeable to lay down with offer to stretch his right leg and turn on soft rock music. Performed hip and knee flexion extension with active assist range of motion x3.  Patient in secured enclosure bed with brakes locked, telesitter in place, and all needs in reach at end of session.  Therapy Documentation Precautions:  Precautions Precautions: Fall Precaution Comments: no longer has PEG Other Brace: prevalon boot  for wound on pt's R  heel Restrictions Weight Bearing Restrictions: No RLE Weight Bearing: Weight bearing as tolerated Other Position/Activity Restrictions: may progress to WBAT per ortho 05/21/20   Therapy/Group: Individual Therapy  Raford Brissett L Keeli Roberg PT, DPT  05/28/2020, 7:32 PM
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
## 2020-05-28 NOTE — Progress Notes (Signed)
Speech Language Pathology Daily Session Note  Patient Details  Name: SUMIT BRANHAM MRN: [id number] Date of Birth: 01-Jan-1971  Today's Date: 05/28/2020 SLP Individual Time: 2878-6767 SLP Individual Time Calculation (min): 43 min  Short Term Goals: Week 3: SLP Short Term Goal 1 (Week 3): Patient will consume current diet with minimal overt s/s of aspiration and overall Min A verbal cues for use of swallowing compensatory strategies. SLP Short Term Goal 2 (Week 3): Patient will demonstrate sustained attention to functional tasks for 10 minutes with Mod verbal cues for redirection. SLP Short Term Goal 3 (Week 3): Patient will utilize external aids for orientation to time, place and situation with Max verbal and visual cues. SLP Short Term Goal 4 (Week 3): Patient will demonstrate functional problem solving for basic and familiar tasks with Mod verbal cues. SLP Short Term Goal 5 (Week 3): Patient will consume trials of thin liquids via cup without overt s/s of aspiration over 3 sessions with Mod verbal cues for use of swallowing strategies to assess readiness for repeat MBS.  Skilled Therapeutic Interventions:Skilled ST services focused on swallow and cognitive skills. SLP opened enclosure bed and pt sat on the side of the bed with initial max A tactile/verbal cues to remain seated for lunch time meal. Pt throughout the session was pleasant but demonstrated severe language of confusion and hallucinations of other people being present in the room. Pt demonstrated no awareness of errors when SLP provided education. SLP facilitated PO consumption of nectar thick and trial dys 2 texture lunch tray.  While pt consumed primarily at a fast rate, he demonstrated appropriate oral containment, oral clearance, swallow appeared timely and no overt s/s aspiration notes. Pt was able to demonstrate a slower pace when SLP allowed him to eat in the enclosure bed, due to complained of back pain. Pt was orientated to place  given options only and month with 50% accuracy.SLP upgraded diet to dys 2 textures and nectar thick liquids with continued full supervision. Pt was left in enclosure bed. SLP recommends to continue skilled services.     Pain Pain Assessment Pain Scale: 0-10  Therapy/Group: Individual Therapy  Kentley Blyden  [facility] 05/28/2020, 4:32 PM

## 2020-05-28 NOTE — Progress Notes (Signed)
Physician assistant requested that I look at all incisions sites on patient.  Spouse had called and questioned that she had seen sutures.  Patient did have sutures on upper right hip.  RN and NT along with occupational therapist removed sutures.  Sutures were very deep and caused the patient to have pain and distress.  Patient was yelling during the process.  RN and NT were able to remove all sutures.  Physician assistant informed on the area looking swollen and red.  Physician assistant gave verbal order for Bacitracin to be applied BID.  RN put order in and will apply Bacitracin.
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
## 2020-05-28 NOTE — Progress Notes (Signed)
Occupational Therapy Session Note  Patient Details  Name: Adam Bates MRN: [id number] Date of Birth: February 06, 1971  Today's Date: 05/28/2020 OT Individual Time: 1005-1105 OT Individual Time Calculation (min): 60 min    Skilled Therapeutic Interventions/Progress Updates:    1:1. Pt received in bed agreeable to OT sitting up with back leaned on enclosure bed. Pt reporting back pain throughout session therefore intervention focused on movements to improve back pain by stretching through forward folds, gentle twists and leaning back while selecting blocks by color in sets of 10 for focused attention, serial counting, and pain management. Pt able to select 10 blocks x2 trials with total cuing for sustaining attention, however middle trial pt unable to identify color red. Pt requiring mod VC for ball tossing and counting 1-20 and naming letters in alphabetical order. Pt with increased difficulty with letters requiring 5 trials especially when gym gets more busy. Pt returned to room and lies on L side with RN/NT present removing embedded stitches. Pt crying in pain but responding well to calming stimuli provided by OT: deep breathing, squeezing OT hand, OT rubbing pt back. Pt appropriately asking, "can I move my leg" and keeping still till told yes. Exited session with pt seated in enclosure bed, exit alarm on and call light in reach   Therapy Documentation Precautions:  Precautions Precautions: Fall Precaution Comments: no longer has PEG Other Brace: prevalon boot  for wound on pt's R heel Restrictions Weight Bearing Restrictions: No RLE Weight Bearing: Weight bearing as tolerated Other Position/Activity Restrictions: may progress to WBAT per ortho 05/21/20 General:   Vital Signs: Therapy Vitals Temp: 97.7 F (36.5 C) Temp Source: Oral Pulse Rate: 87 Resp: 18 BP: 115/84 Patient Position (if appropriate): Lying Oxygen Therapy SpO2: 100 % O2 Device: Room Air Pain:   ADL:   Vision    Perception    Praxis   Exercises:   Other Treatments:     Therapy/Group: Individual Therapy  Tonny Branch 05/28/2020, 6:59 AM

## 2020-05-28 NOTE — Progress Notes (Signed)
Patient had a descent day regarding agitation.  Patient received a new enclosure bed today and patient did transfer to new bed without distress.  Patient did have a BM in the bed and became agitated when RN and NT tried to clean him and the bed.  Patient began to scream and cuss.  RN and NT decided to leave patient and try again later.  Patient did not want to be bothered.  71 male NT went in later and got patient up to beside commode to help clean him.  Patient did yell and scream and cuss during process.

## 2020-05-29 ENCOUNTER — Inpatient Hospital Stay (HOSPITAL_COMMUNITY): Payer: Managed Care, Other (non HMO)
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
## 2020-05-29 NOTE — Progress Notes (Signed)
Occupational Therapy Session Note  Patient Details  Name: Adam Bates MRN: [id number] Date of Birth: 1971-04-17  Today's Date: 05/29/2020 OT Individual Time: 1400-1455 OT Individual Time Calculation (min): 55 min    Short Term Goals: Week 1:  OT Short Term Goal 1 (Week 1): Pt will complete LB dressing with mod assist. OT Short Term Goal 1 - Progress (Week 1): Met OT Short Term Goal 2 (Week 1): Pt will follow 1-step commands during a self-care task with 50% accuracy. OT Short Term Goal 2 - Progress (Week 1): Met OT Short Term Goal 3 (Week 1): Pt will complete functional transfers with mod assist OT Short Term Goal 3 - Progress (Week 1): Met OT Short Term Goal 4 (Week 1): Pt will complete bathing with mod assist. OT Short Term Goal 4 - Progress (Week 1): Progressing toward goal  Skilled Therapeutic Interventions/Progress Updates:    1:1. Pt seen in bed and cheery when OT entered. Pt ate a few bites of food at beginning and end of session with encouragement and VC for slow rate. Pt completes ambulatory transfers throughout session with CGA overall. Pt sits EOM and places pegs in tray with forced looking to R d/t R inattention with pegs placed on R. Pt continues to demo issues with color discrimination and focused attention impacting performance of color sorting task. Pt ambulates to ADL apartment and sits at table in kitchen for sorting silverware with only 3 errors in 15 pieces of silver ware (forks and spoons only). Pt stands during 2 trials to unload dishwasher on R for R attention with VC for attention to sorting types of dyishes into cabinet 50% of time. Exited session with pt seated in enclosure bed, exit alarm on and call light in reach   Therapy Documentation Precautions:  Precautions Precautions: Fall Precaution Comments: no longer has PEG Other Brace: prevalon boot  for wound on pt's R heel Restrictions Weight Bearing Restrictions: Yes RLE Weight Bearing: Weight bearing as  tolerated Other Position/Activity Restrictions: may progress to WBAT per ortho 05/21/20 General:   Vital Signs:   Pain:   ADL:   Vision   Perception    Praxis   Exercises:   Other Treatments:     Therapy/Group: Individual Therapy  Tonny Branch 05/29/2020, 3:00 PM

## 2020-05-29 NOTE — Progress Notes (Signed)
Physical Therapy Session Note  Patient Details  Name: Adam Bates MRN: [id number] Date of Birth: Dec 15, 1970  Today's Date: 05/29/2020 PT Individual Time: 1018-1100 PT Individual Time Calculation (min): 42 min   Short Term Goals: Week 3:  PT Short Term Goal 1 (Week 3): Patient will perform basic transfers with CGA using LRAD. PT Short Term Goal 2 (Week 3): Patient will ambulate >100 ft using LRAD for improved reslessness and increased activity tolerance. PT Short Term Goal 3 (Week 3): Patient will attend to and perform dynamic balance activities >5 min with min A for balance with single UE support.  Skilled Therapeutic Interventions/Progress Updates:     Pt received supine in posey bed, agreeable to therapy. Reports some pain in R leg. Number not provided. PT provides rest breaks as needed to manage pain. Supine to sit with supervision for safety, and cues for safe positioning at EOB. PT dons pt's socks for time management. Pt performs sit to stand with CGA and RW. Pt ambulates multiple bouts during session, with extended seated rest break between each bout. PT cues for safe RW management, redirection to task, and cues for sequencing and positioning when transitioning to seated surface. Pt ambulates bouts of x100', 4x150'. WB through R leg appears to improve as distance increases. Pt performs sit to supine in bed with supervision. Left in Posey bed.  Therapy Documentation Precautions:  Precautions Precautions: Fall Precaution Comments: no longer has PEG Other Brace: prevalon boot  for wound on pt's R heel Restrictions Weight Bearing Restrictions: Yes RLE Weight Bearing: Weight bearing as tolerated Other Position/Activity Restrictions: may progress to WBAT per ortho 05/21/20   Therapy/Group: Individual Therapy  [name], PT, DPT 05/29/2020, 4:06 PM

## 2020-05-29 NOTE — Progress Notes (Signed)
Physical Therapy Session Note  Patient Details  Name: Adam Bates MRN: [id number] Date of Birth: 25-May-1970  Today's Date: 05/29/2020 PT Individual Time: 0800-0900 and 1300-1355 PT Individual Time Calculation (min): 75 min and 55 min   Short Term Goals: Week 3:  PT Short Term Goal 1 (Week 3): Patient will perform basic transfers with CGA using LRAD. PT Short Term Goal 2 (Week 3): Patient will ambulate >100 ft using LRAD for improved reslessness and increased activity tolerance. PT Short Term Goal 3 (Week 3): Patient will attend to and perform dynamic balance activities >5 min with min A for balance with single UE support.  Skilled Therapeutic Interventions/Progress Updates:     Session 1: Patient in secured enclosure bed upon PT arrival. Patient alert and agreeable to PT session. Patient reported R leg pain during session, RN made aware and provided pain medicine. PT provided repositioning, rest breaks, and distraction as pain interventions throughout session. Patient's medicine crushed in thickened tea by RN, patient perseverated on the bad taste of the mixture and did not finish taking his oxycodone and robaxin provided by RN despite >5 min of cues and encouragement. Discussed use of apple sauce with crushed medications, will confirm with SLP.   Patient with improved attention and increased appropriate behaviors and responses this session. Continues to present with Intermittent language of confusion, perseveration, and poor recall of new information throughout session.  Therapeutic Activity: Bed Mobility: Patient performed supine to/from sit with supervision x2. Provided verbal cues for safety awareness and management of R lower extremity. Transfers: Patient performed stand pivot bed<>TIS w/c without an AD and sit to/from stand x2 from the bed and toilet using a RW with CGA-min A for balance and management of RW. Provided verbal cues for hand placement, scooting forward, and management  of impulsivity with clear instructions to wait for the therapist to be ready before standing, patient followed this cue well throughout session. Patient was continent of bladder during toileting. Performed lower body clothing management with min A, required mod A for balance and cues for safety due to patient letting go with both hands to pull up his pants.   Gait Training:  Patient ambulated 12 feet using RW to/from the bathroom with CGA-min A for balance and AD managment. Ambulated with step-to gait pattern leading with R, antalgic gait with knee in flexion throughout on R. Provided verbal cues for erect posture, as patient tends to rest on his elbow on the RW, increased R knee flexion in swing, and safety awareness with RW.  Patient sat in the TIS w/c >15 min and sat up in the bed x10 min, adjusted positioning due to patient complaint of back and R leg pain in TIS w/c, focused on eating in low stim environment with only the therapist in the room to reduce distration. Required cues for attention to task, safe swallowing technique and small bite sizes. Patient used his L hand throughout and required cues to use the appropriate utensil to eat his food. Patient ate ~1/3 of his plate with eggs and chopped suasage, drank >50% of his thickened dairy supplemet, and drank 1.5 nectar thick juices. Patient then declined eating anything else.  Patient in secured enclosure bed at end of session with breaks locked, Telesitter in place, and all needs within reach.   Session 2: Patient in secured enclosure bed in demonstrating mild agitation and confusion due to urine incontinence in the bed upon PT arrival. Patient easily redirected with orietation and explanation of situation.  Patient was alert and agreeable to PT session and agreeable to ambulating to the bathroom for peri-care and upper and lower body bathing. Patient indicated right lower extremity pain with grimacing and guarding throughout session RN made aware  PT provided repositioning rest breaks and distraction as pain interventions throughout session.  Bed Mobility: Patient performed supine to/from sit with supervision. Provided cues for safety awareness due to impulsivity. Transfers: Patient performed sit to/from stand with CGA using RW and stand pivot TIS w/c>bed with min A, as above.  Gait Training: Patient ambulated to/from the bathroom with contact guard to min A for safety and balance. Ambulated with Step-to gait pattern leading with right lower extremity, antalgic gate on right, with decreased weight shift and increased right knee flexion in stance.  Bathing/Dressing: Patient sat on the toilet in the bathroom, doffed shorts and shirt With supervision, performed upper and lower body bathing and peri care with total assist due to poor attention to task. Patient emotionally labile, crying in the bathroom about incontinence, confusion, and frustration with staff. Redirected patient and provided emotional support throughout. Patient then donned socks with total A, shorts with min assist for right lower extremity and a T-shirt with set up assist.   Wheelchair Mobility:  Patient was transported in the w/c with total A throughout session for energy conservation and time management.  Neuromuscular Re-ed: Patient performed the following activities seated in the TIS w/c: -ball toss with beach ball counting to 20 forwards then backwards, no errors counting forwards, but frequently jumping a few numbers back or forwards requiring max A to progress through the task -performed visual scanning task to locate large dots on BITS system: first trial: located 5 dots in 1 min with max cues for understanding the task; second trial located 17 dots with 80% accuracy in 2 min with intermittent max cues to locate the dot, patient would locate 3-4 in a row without cues then say, "I can't see any," no clear consistency with spatial location of dots that where more  challenging for the patient  Patient's bed still wet, provided patient with magic cup, as PT discussed appropriate measures to take for patient to return to bed or have 1-1 supervision from staff in the TIS w/c. Suggested NT sit with patient to eat lunch, but patient declined eating anything at this time. Placed 2 paper chuck pads on patient's bed per discussion with LPN, and returned patient back to bed.  Patient in secured enclosure bed at end of session with breaks locked, telesitter in place, and all needs within reach.    Therapy Documentation Precautions:  Precautions Precautions: Fall Precaution Comments: no longer has PEG Other Brace: prevalon boot  for wound on pt's R heel Restrictions Weight Bearing Restrictions: Yes RLE Weight Bearing: Weight bearing as tolerated Other Position/Activity Restrictions: may progress to WBAT per ortho 05/21/20   Therapy/Group: Individual Therapy  Adam Bates L Daiwik Buffalo PT, DPT  05/29/2020, 4:25 PM

## 2020-05-30 LAB — URINE CULTURE: Culture: >=100000 — AB

## 2020-05-30 NOTE — Progress Notes (Signed)
Physical Therapy Session Note  Patient Details  Name: Adam Bates MRN: [id number] Date of Birth: 06/27/1970  Today's Date: 05/30/2020 PT Individual Time: 1683-7290 PT Individual Time Calculation (min): 53 min   Short Term Goals: Week 3:  PT Short Term Goal 1 (Week 3): Patient will perform basic transfers with CGA using LRAD. PT Short Term Goal 2 (Week 3): Patient will ambulate >100 ft using LRAD for improved reslessness and increased activity tolerance. PT Short Term Goal 3 (Week 3): Patient will attend to and perform dynamic balance activities >5 min with min A for balance with single UE support.  Skilled Therapeutic Interventions/Progress Updates:   Pt received standing in day room with 2 NT, 2 RN and additional PT as pt becoming increasingly agitated, and threatening staff if they did not comply with his direction. PT engaged pt multiple times to attempt redirection away from additional staff and encourage pt back to room. With significant effort. Pt ambulated back to room with RW, with intermittent bouts of frustration and suspicion  of anyone in hall, requiring maximal cues and encouragement to return to room to rest RLE and maintain personal and staff safety. Once in room pt continued to demonstrate intermittent bouts of frustration and agitation with multiple verbal threats to 2 PTs in room with pt. One bout of aggression when PT attempted to remove RW, in attempt to require pt to sit EOB. But no harm caused to pt or either PT. Once second therapist left room, pt began to become more agreeable to requests from PT to return tom bed but still required max encouragement to finally sit EOB and then bring BLE into bed to allow closure of enclosure posey bed. Pt's wife then present in room and pt left with all needs met and staff aware of patient position.         Therapy Documentation Precautions:  Precautions Precautions: Fall Precaution Comments: no longer has PEG Other Brace:  prevalon boot  for wound on pt's R heel Restrictions Weight Bearing Restrictions: No RLE Weight Bearing: Weight bearing as tolerated Other Position/Activity Restrictions: may progress to WBAT per ortho 05/21/20 Vital Signs: Therapy Vitals Temp: 98.4 F (36.9 C) Temp Source: Oral Pulse Rate: 89 Resp: 17 BP: 117/75 Patient Position (if appropriate): Sitting Oxygen Therapy SpO2: 99 % O2 Device: Room Air Pain: Pain Assessment Pain Score: 2     Therapy/Group: Individual Therapy  Lorie Phenix 05/30/2020, 5:51 PM

## 2020-05-30 NOTE — Progress Notes (Signed)
Speech Language Pathology Weekly Progress and Session Note  Patient Details  Name: Adam Bates MRN: [id number] Date of Birth: 02-27-1971  Beginning of progress report period: May 23, 2020 End of progress report period: May 30, 2020  Today's Date: 05/30/2020 SLP Individual Time: 0950-1030 SLP Individual Time Calculation (min): 40 min  Short Term Goals: Week 3: SLP Short Term Goal 1 (Week 3): Patient will consume current diet with minimal overt s/s of aspiration and overall Min A verbal cues for use of swallowing compensatory strategies. SLP Short Term Goal 1 - Progress (Week 3): Met SLP Short Term Goal 2 (Week 3): Patient will demonstrate sustained attention to functional tasks for 10 minutes with Mod verbal cues for redirection. SLP Short Term Goal 2 - Progress (Week 3): Progressing toward goal SLP Short Term Goal 3 (Week 3): Patient will utilize external aids for orientation to time, place and situation with Max verbal and visual cues. SLP Short Term Goal 3 - Progress (Week 3): Progressing toward goal SLP Short Term Goal 4 (Week 3): Patient will demonstrate functional problem solving for basic and familiar tasks with Mod verbal cues. SLP Short Term Goal 4 - Progress (Week 3): Progressing toward goal SLP Short Term Goal 5 (Week 3): Patient will consume trials of thin liquids via cup without overt s/s of aspiration over 3 sessions with Mod verbal cues for use of swallowing strategies to assess readiness for repeat MBS. SLP Short Term Goal 5 - Progress (Week 3): Progressing toward goal    New Short Term Goals: Week 4: SLP Short Term Goal 1 (Week 4): Patient will consume current diet with minimal overt s/s of aspiration and overall supervision verbal cues for use of swallowing compensatory strategies. SLP Short Term Goal 2 (Week 4): Patient will demonstrate sustained attention to functional tasks for 10 minutes with Mod verbal cues for redirection. SLP Short Term Goal 3 (Week 4):  Patient will utilize external aids for orientation to time, place and situation with Max verbal and visual cues. SLP Short Term Goal 4 (Week 4): Patient will demonstrate functional problem solving for basic and familiar tasks with Mod verbal cues. SLP Short Term Goal 5 (Week 4): Patient will consume trials of thin liquids via cup without overt s/s of aspiration over 3 sessions with Mod verbal cues for use of swallowing strategies to assess readiness for repeat MBS.  Weekly Progress Updates:   Pt has made slow, functional gains this reporting period and has met 1 out of 5 short term goals.  Pt is currently max assist for tasks due to significant cognitive deficits and currently presents with emerging RL V characteristics.  Pt has demonstrated improved toleration of advanced solids.   Pt is consuming dys 2, nectar thick liquids diet with min cues for use of swallowing precautions.  Pt and family education is ongoing.  Pt would continue to benefit from skilled ST while inpatient in order to maximize functional independence and reduce burden of care prior to discharge.  Anticipate that pt will need 24/7 supervision at discharge in addition to [place] follow up at next level of care      Intensity: Minumum of 1-2 x/day, 30 to 90 minutes Frequency: 3 to 5 out of 7 days Duration/Length of Stay: 06/11/19 Treatment/Interventions: Cognitive remediation/compensation;Environmental controls;Cueing hierarchy;Functional tasks;Therapeutic Activities;Therapeutic Exercise;Dysphagia/aspiration precaution training;Medication managment;Patient/family education   Daily Session  Skilled Therapeutic Interventions:  Pt was seen for skilled ST targeting cognitive and dysphagia goals.  Upon arrival, pt was sitting upright at  edge of bed eating breakfast with full supervision from nursing.  SLP took over supervision for the remainder of meal.  Pt needed max assist verbal cues to attend to meal due to internal distraction and  verbosity.  He only consumed pureed textures despite encouragement but demonstrated no overt s/s of aspiration with purees or nectar thick liquids.  Pt was reoriented frequently throughout treatment session to maximize carryover of orientation information as he continues to persist with severe language of confusion.  Pt was initially reluctant to returning to enclosure bed at the end of today's treatment session but was eventually agreeable after redirection.  Pt was left in enclosure bed.  Continue per current plan of care.       General    Pain Pain Assessment Pain Scale: 0-10 Pain Score: 0-No pain  Therapy/Group: Individual Therapy  Jayvin Hurrell, Selinda Orion 05/30/2020, 1:03 PM
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
## 2020-05-30 NOTE — Progress Notes (Signed)
Physical Therapy Session Note  Patient Details  Name: Adam Bates MRN: [id number] Date of Birth: 1970-05-12  Today's Date: 05/30/2020 PT Individual Time: 1300-1400 PT Individual Time Calculation (min): 60 min   Short Term Goals: Week 1:  PT Short Term Goal 1 (Week 1): Pt will perform sit to stand w/RW and adhere to wbing precautions w/min assist PT Short Term Goal 1 - Progress (Week 1): Partly met PT Short Term Goal 2 (Week 1): stand pivot bed to/from wc w/RW and min assist adhering to wbing precautions PT Short Term Goal 2 - Progress (Week 1): Partly met PT Short Term Goal 3 (Week 1): pt will tolerate initiation of gait training PT Short Term Goal 3 - Progress (Week 1): Met PT Short Term Goal 4 (Week 1): Pt will recall route to  room from primary utilized treatment area w/minimal cueing required. PT Short Term Goal 4 - Progress (Week 1): Progressing toward goal Week 2:  PT Short Term Goal 1 (Week 2): Patient will perform basic transfers with min A while adhering to weight bearing precautions with cues consistently. PT Short Term Goal 1 - Progress (Week 2): Met PT Short Term Goal 2 (Week 2): Patient will ambulate 20 ft using LRAD with min A while adhering to weight bearing precautions. PT Short Term Goal 2 - Progress (Week 2): Met PT Short Term Goal 3 (Week 2): Pt will recall route to  room from primary utilized treatment area w/minimal cueing required. PT Short Term Goal 3 - Progress (Week 2): Progressing toward goal  Skilled Therapeutic Interventions/Progress Updates:    PAIN Pt c/o back pain, therex performed to address, see below. Pt initially awake and alert in Posey Bed. Supine to sit w/supervision.  Sit to stand w/RW and cga.  Gait 172f w/rw and cga, turn sit to mat w/cues.  Pt given simple two step verbal instructions for navigation from central hall to gym, pt unable to follow directions/recall despite mult cues.  Attempted standing activity, but pt c/o back pain.   Performed the following to address: Alternating knees to chest  Piriformis stretches Lower trunk rotation Quadruped cat/camel - poor ability to coordinate this Quadruped arm raises Prone on elbow stretch.  Prone to supine to sit w/supervision. Sit to stand w/cga to rw.  Gait 150fx 2 to/from stairs w/rw and cga  Stairs: Ascended/descended 8steps w/2 rails and cues for sequencing, cga.  Standing ball toss into rebounder w/cga while counting 1-20 and pt recalled goal of "20" stopping activity appropriately. Repeated x 2 then attempted to count in reverse, pt unable to focus on task.  Pt appeared to be experiencing intermittent hallucinations during session?  Asks about the "guy next to me" when no one present.  Also stated "oh excuse me sir" when bumping into doorway w/walker.  Pt then self distracts and unable to discuss further w/therapist.  Pt transported to hallway 10031from room.  Instructed w/single instruction directions to room, pt unable to recall or follow this direction for navigation.  Pt provided w/room number and cued to use room numbers on doorway, pointed out number on adjacent room, pt then reads his room number (15 as 16).    Turn/sit to edge of bed w/cga.  Sit to supine w/supervision, additional time for management of RLE.  Posey Bed secured and pt left w/needs in reach, resting calmly, became tearful stating he missed "his little girl".      Therapy Documentation Precautions:  Precautions Precautions: Fall Precaution Comments: no  longer has PEG Other Brace: prevalon boot  for wound on pt's R heel Restrictions Weight Bearing Restrictions: No RLE Weight Bearing: Weight bearing as tolerated Other Position/Activity Restrictions: may progress to WBAT per ortho 05/21/20   Therapy/Group: Individual Therapy Callie Fielding, [place] 05/30/2020, 3:42 PM

## 2020-05-30 NOTE — Progress Notes (Signed)
Physical Therapy Session Note  Patient Details  Name: Adam Bates MRN: [id number] Date of Birth: 05/09/1970  Today's Date: 05/30/2020 PT Individual Time: 5465-6812 PT Individual Time Calculation (min): 30 min   Short Term Goals: Week 3:  PT Short Term Goal 1 (Week 3): Patient will perform basic transfers with CGA using LRAD. PT Short Term Goal 2 (Week 3): Patient will ambulate >100 ft using LRAD for improved reslessness and increased activity tolerance. PT Short Term Goal 3 (Week 3): Patient will attend to and perform dynamic balance activities >5 min with min A for balance with single UE support.  Skilled Therapeutic Interventions/Progress Updates:     Pt received supine in bed and agrees to therapy. Reports pain in back. PT provides rest breaks and alerts RN to manage pain symptoms. Supine to sit with supervision and cues on positioning for safety. Sit to stand with close supervision and cues to hold onto RW for safety. Pt ambulates bouts of 100', 120', and 100' with RW, CGA and extended seated rest breaks. PT cues on safe RW management and redirect pt to task due to significant distractibility and decline in safety when attention not on mobility. Pt perform stand step back to bed with CGA and RW. Left supine in Posey bed.  Therapy Documentation Precautions:  Precautions Precautions: Fall Precaution Comments: no longer has PEG Other Brace: prevalon boot  for wound on pt's R heel Restrictions Weight Bearing Restrictions: No RLE Weight Bearing: Weight bearing as tolerated Other Position/Activity Restrictions: may progress to WBAT per ortho 05/21/20   Therapy/Group: Individual Therapy  [name], PT, DPT 05/30/2020, 3:51 PM

## 2020-05-30 NOTE — Progress Notes (Signed)
Occupational Therapy Session Note  Patient Details  Name: Adam Bates MRN: [id number] Date of Birth: 10/22/1970  Today's Date: 05/30/2020 OT Individual Time: 0833-0929 OT Individual Time Calculation (min): 56 min    Short Term Goals: Week 1:  OT Short Term Goal 1 (Week 1): Pt will complete LB dressing with mod assist. OT Short Term Goal 1 - Progress (Week 1): Met OT Short Term Goal 2 (Week 1): Pt will follow 1-step commands during a self-care task with 50% accuracy. OT Short Term Goal 2 - Progress (Week 1): Met OT Short Term Goal 3 (Week 1): Pt will complete functional transfers with mod assist OT Short Term Goal 3 - Progress (Week 1): Met OT Short Term Goal 4 (Week 1): Pt will complete bathing with mod assist. OT Short Term Goal 4 - Progress (Week 1): Progressing toward goal  Skilled Therapeutic Interventions/Progress Updates:    1:1. Pt with pain in RLE reported upon entering and RN present delivering medicaiton. Pt requires increased time for attention and initaition of mobility into bathroom. Pt completes toileting with CGA and doffs clothing in standing despite VC for seated posture with MAX A for LB clothing and CGA for shirt in stance. Pt completes seated bathing with A for B feet and buttocks. Pt washes hair 2x d/t poor memory and sequencing. Pt dons shirt with set up and pants with MOD A. Socks with S overall. Pt grooms seated with MOD VC for sequencing and problem solving. Seated on mat at tx gym, pt completes 5x30 sec intervals of boxing to target with target at midline, B sides, or moving to work on sustained attention, R attention and visual tracking. Overall pt in less excited state this date with less emotional lability, however language of confusion and aphasia still impacting performandce and expression of wants/needs. Exited session with pt seated in bed, exit alarm on and call light in reach   Therapy Documentation Precautions:  Precautions Precautions:  Fall Precaution Comments: no longer has PEG Other Brace: prevalon boot  for wound on pt's R heel Restrictions Weight Bearing Restrictions: No RLE Weight Bearing: Weight bearing as tolerated Other Position/Activity Restrictions: may progress to WBAT per ortho 05/21/20 General:   Vital Signs: Therapy Vitals Temp: 97.6 F (36.4 C) Temp Source: Oral Pulse Rate: 86 Resp: 18 BP: 111/81 Patient Position (if appropriate): Sitting Oxygen Therapy SpO2: 99 % O2 Device: Room Air Pain:   ADL:   Vision   Perception    Praxis   Exercises:   Other Treatments:     Therapy/Group: Individual Therapy   M  05/30/2020, 6:45 AM  

## 2020-05-31 DIAGNOSIS — S069X0S Unspecified intracranial injury without loss of consciousness, sequela: Secondary | ICD-10-CM
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
## 2020-06-01 NOTE — Progress Notes (Signed)
Physical Therapy Session Note  Patient Details  Name: Adam Bates MRN: [id number] Date of Birth: [birth year]  Today's Date: 06/01/2020 PT Individual Time: 0840-0940 PT Individual Time Calculation (min): 60 min   Short Term Goals: Week 3:  PT Short Term Goal 1 (Week 3): Patient will perform basic transfers with CGA using LRAD. PT Short Term Goal 2 (Week 3): Patient will ambulate >100 ft using LRAD for improved reslessness and increased activity tolerance. PT Short Term Goal 3 (Week 3): Patient will attend to and perform dynamic balance activities >5 min with min A for balance with single UE support.  Skilled Therapeutic Interventions/Progress Updates:     Patient open enclosure bed eating breakfast with NT and RN in the room upon PT arrival. Patient alert and agreeable to PT session. Patient reported 1-5/10 R leg pain during session, RN made aware and provided Tylenol during session. PT provided repositioning, rest breaks, and distraction as pain interventions throughout session.   Patient continues to present with significant language of confusion, more appropriate and less agitation/overstimulation this session, and initiated appropriate conversation with new PT present for co-treat during session.   Therapeutic Activity: Bed Mobility: Patient performed supine to/from sit with supervision and increased time for R leg managment with HOB slightly elevated. Donned socks total A EOB for time management. Transfers: Patient performed sit to/from stand from the bed x1, mat table x4, and standard chair without arms x1 with CGA for safety/balance, with 1 LOB to the R when standing while distracted. Demonstrated safe hand placement on RW and pulled the RW in front of him prior to transfers appropriately. He performed a toilet transfer x1, was continent of bladder, and performed lower body dressing with min A.   Gait Training:  Patient ambulated ~80 feet to/from the day room and >180 feet around the  [place] station using RW with CGA for safety. Ambulated with step-through gait pattern with decreased step length on L due to decreased weight shift onto R leg, decreased R knee flexion in swing, and increased upper extremity use in R stance. Provided verbal cues for increased L step length, increased knee flexion and heel strike on R, and increased R weight shift as tolerated for improved gait mechanics and functional use of his R leg. Adjusted RW height for gait training for improved leverage of upper extremities and reduced shoulder elevation.  Neuromuscular Re-ed: Patient performed the following activities: -standing ball toss while doing basic 1 digt addition problems, initially patient with correct responses, then required 1 cue to correct, then patient perseverated on a number from a previous problem and was unable to continue with addition or ball toss, requesting to sit down -standing ball toss while saying the alphabet, skipped from C to G, but otherwise correct throughout -seated kicking beach ball with R foot, focused on knee flexion for preparation of kick and full extension when kicking, patient followed cues and demos good timing with ball rolling towards him  Therapeutic Exercise: Patient performed the following exercises with verbal and tactile cues for proper technique. -R knee extension with hamstring stretch 2x30 sec -R knee flexion on mat table with foot on 2" stool for resistance, progressed with manual OP to increased flexion range as tolerated  Oriented patient to place, situation, and time during session. Patient marked out previous days on the calendar, doing 2/4 without cues and relying on therapist to complete the last 2 due to internal distraction from back discomfort in sitting.   Patient in secured  enclosure bed at end of session with breaks locked, telesitter in place, and all needs within reach. Patietn declined eating more breakfast and reported increased back pain  and fatigue at end of session. Asked therapist to come back later, reported that he spends too much time in the bed. Patient missed 15 min of skilled PT due to fatigue/pain, RN made aware. Will attempt to make-up missed time as able.     Therapy Documentation Precautions:  Precautions Precautions: Fall Precaution Comments: no longer has PEG Other Brace: prevalon boot  for wound on pt's R heel Restrictions Weight Bearing Restrictions: No RLE Weight Bearing: Weight bearing as tolerated Other Position/Activity Restrictions: may progress to WBAT per ortho 05/21/20 General: PT Amount of Missed Time (min): 15 Minutes   Therapy/Group: Individual Therapy  Branko Steeves L Ryelee Albee PT, DPT  06/01/2020, 9:50 AM

## 2020-06-02 MED ORDER — METHYLPHENIDATE HCL 5 MG PO TABS
10.0000 mg | ORAL_TABLET | Freq: Two times a day (BID) | ORAL | Status: DC
  Administered 2020-06-02 – 2020-06-08 (×12): 10 mg via ORAL
  Filled 2020-06-02 (×13): qty 2

## 2020-06-02 NOTE — Progress Notes (Signed)
Speech Language Pathology Daily Session Note  Patient Details  Name: Adam Bates MRN: [id number] Date of Birth: Oct 16, 1970  Today's Date: 06/02/2020 SLP Individual Time: 7893-8101 SLP Individual Time Calculation (min): 40 min  Short Term Goals: Week 4: SLP Short Term Goal 1 (Week 4): Patient will consume current diet with minimal overt s/s of aspiration and overall supervision verbal cues for use of swallowing compensatory strategies. SLP Short Term Goal 2 (Week 4): Patient will demonstrate sustained attention to functional tasks for 10 minutes with Mod verbal cues for redirection. SLP Short Term Goal 3 (Week 4): Patient will utilize external aids for orientation to time, place and situation with Max verbal and visual cues. SLP Short Term Goal 4 (Week 4): Patient will demonstrate functional problem solving for basic and familiar tasks with Mod verbal cues. SLP Short Term Goal 5 (Week 4): Patient will consume trials of thin liquids via cup without overt s/s of aspiration over 3 sessions with Mod verbal cues for use of swallowing strategies to assess readiness for repeat MBS.  Skilled Therapeutic Interventions: Skilled treatment session focused on cognitive goals. Upon arrival, patient was awake while supine in the enclosure bed. Patient requested to call his wife and required total A to dial the number due to visual deficits. Patient left an appropriate voicemail message asking for his wife but then became perseverative on needing to talk to her. SLP facilitated session by providing extra time and overall Min A verbal cues for basic problem solving during a money management task, however, Mod-Max A verbal cues were needed to generate specific amounts of money due to decreased attention, However, patient with significantly less language of confusion resulting in ability to name coins and participate in a basic conversation for ~3 turns. Patient returned to enclosure bed at end of session. Continue  with current plan of care.      Pain Pain Assessment Pain Scale: 0-10 Pain Score: 6  Pain Type: Surgical pain Pain Location: Leg Pain Orientation: Left;Right Pain Descriptors / Indicators: Aching Pain Frequency: Constant Pain Onset: On-going Patients Stated Pain Goal: 4 Pain Intervention(s): Medication (See eMAR)  Therapy/Group: Individual Therapy  Zalia Hautala 06/02/2020, 12:51 PM

## 2020-06-02 NOTE — Progress Notes (Incomplete)
2025 NTRobb Matar) found patient out of enclosure bed standing up at bedside, upon placing and securing patient in bed the NT stated that the zipper was insided  and assisted patient back in bed. Patient was unaware of surrounding and how he was able to get out of bed. Assigned nurseTherapist, sports) assessed patient,no apparent injuries or complaints noted. Marland Kitchen   93p Called by wife explained states she put him in the bed and zipped his bed up, clicking and securing the locks, she states she would never leave him out of bed or leave
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
## 2020-06-02 NOTE — Progress Notes (Signed)
Physical Therapy Weekly Progress Note  Patient Details  Name: Adam Bates MRN: [id number] Date of Birth: 19-Jan-1971  Beginning of progress report period: May 26, 2020 End of progress report period: June 02, 2020  Today's Date: 06/02/2020 PT Individual Time: 0800-0910 PT Individual Time Calculation (min): 70 min   Patient has met 2 of 3 short term goals.  Patient with steady progress this week demonstrating improved functional mobility and activity tolerance. Continues to present with language of confusion, decreased orientation, decreased attention, R inattention and visual deficits, intermittent agitation, and decreased safety awareness limiting his safety with mobility. He currently requires supervision for bed mobility, CGA for transfers, gait >180 ft with RW, and 8 steps with B rails. Focused on functional mobility with dual task challenge, path fining, safety awareness, balance, and R lower extremity strength/ROM for improved functional mobility and reduced fall risk.  Patient continues to demonstrate the following deficits muscle weakness and muscle joint tightness, decreased cardiorespiratoy endurance, decreased coordination and decreased motor planning, decreased visual acuity, decreased attention to right, decreased attention, decreased awareness, decreased problem solving, decreased safety awareness, decreased memory and delayed processing and decreased sitting balance, decreased standing balance, decreased postural control, decreased balance strategies and difficulty maintaining precautions and therefore will continue to benefit from skilled PT intervention to increase functional independence with mobility.  Patient progressing toward long term goals..  Continue plan of care.  PT Short Term Goals Week 3:  PT Short Term Goal 1 (Week 3): Patient will perform basic transfers with CGA using LRAD. PT Short Term Goal 1 - Progress (Week 3): Met PT Short Term Goal 2 (Week 3):  Patient will ambulate >100 ft using LRAD for improved reslessness and increased activity tolerance. PT Short Term Goal 2 - Progress (Week 3): Met PT Short Term Goal 3 (Week 3): Patient will attend to and perform dynamic balance activities >5 min with min A for balance with single UE support. PT Short Term Goal 3 - Progress (Week 3): Progressing toward goal Week 4:  PT Short Term Goal 1 (Week 4): Patient will perform dynamic standing balance activity >5 min with CGA. PT Short Term Goal 2 (Week 4): Patient will recall path from day room or main gym to his room with min cues. PT Short Term Goal 3 (Week 4): Patient will progress knee ROM by 10 degrees. PT Short Term Goal 4 (Week 4): Patient will tolerate ambulating throughout sessions without use of w/c transport for improved activity tolerance.  Skilled Therapeutic Interventions/Progress Updates:     Patient in secured enclosure bed upon PT arrival. Patient alert and agreeable to PT session. Patient reported intermittent R leg pain during session, RN made aware. PT provided repositioning, rest breaks, and distraction as pain interventions throughout session.   Patient incontinent in the bed and upset that he did not get assistance when needed. Reviewed use of call bell during session for improved patient communication with staff. Also, reinforced timed toileting with staff at end of session.   Therapeutic Activity: Bed Mobility: Patient performed supine to/from sit with supervision and increased time for R lower extremity.  Transfers: Patient performed sit to/from stand from bed, TIS w/c, toilet, NuStep, and shower chair with CGA using RW. Provided verbal cues for hand placement and safety awareness. Patient performed an ambulatory transfer to the bathroom, was continent of bladder during toileting. Patient then self selected to sit on the shower bench. Doffed shirt and pants independently, non-skid socks remained on as patient started the water  to  the shower spontaneously. Assisted patient with adjusting the water. Patient performed upper and lower body bathing with set-up assist and mod A for cues for thoroughness, sequencing, and lateral leans for lower body bathing. Patient dried off then donned shorts, shirt, and new socks with set-up assist and min A for R sock due to decreased R knee ROM. Patient spontaneous with mobility during dressing, however, consistently used RW and maintained within arms reach of PT throughout.   Gait Training:  Patient ambulated >180 feet using RW with CGA. Ambulated with step-to progressing to step-through gait pattern with cues, decreased knee flexion in swing on R, and midfoot placement on R at initial contact. Provided verbal cues for increased R weight shift for improved L step length, increased knee flexion and heel strike on R, and attention on R due to running into multiple obstacles on the R.  Patient ascended/descended 8-6" steps using L hand and R HHA while ascending first 4 steps due to poor attention on the R then using B rails with CGA-min A. Performed step-to gait pattern leading with L while ascending and R while descending following therapist's cues. Provided cues for technique and sequencing.   Wheelchair Mobility:  Patient was transported in the w/c with total A throughout session for energy conservation and time management.  Therapeutic Exercise: Patient performed the following exercises with verbal and tactile cues for proper technique. -NuStep 2 min 30 sec before patient was unable to attend and asked to get off the machine, work load 3, working on increased knee flexion ROM, progressed distance from foot pedals x1  -progressive knee flexion stretch in supine with soft tissue mobilization to quad for improved tolerance and ROM  MD rounded during session. Reported improved self regulation and intermittent awareness, limited by attention and language of confusion.   Patient perseverated on  calling his wife during the session. Patient redirected with plans to call her at end of session. Patient unable to recall her phone number. Retrieved from patient's chart and PT left a message for the patient's wife with patient present during session.   Patient in secured enclosure bed at end of session with breaks locked, and call bell in reach.    Therapy Documentation Precautions:  Precautions Precautions: Fall Precaution Comments: no longer has PEG Other Brace: prevalon boot  for wound on pt's R heel Restrictions Weight Bearing Restrictions: No RLE Weight Bearing: Weight bearing as tolerated Other Position/Activity Restrictions: may progress to WBAT per ortho 05/21/20  Therapy/Group: Individual Therapy  Glade Strausser L Farrell Pantaleo PT, DPT  06/02/2020, 12:16 PM

## 2020-06-02 NOTE — Progress Notes (Signed)
Occupational Therapy Session Note  Patient Details  Name: Adam Bates MRN: [id number] Date of Birth: [birth year]  Today's Date: 06/02/2020 OT Individual Time: 0867-6195 OT Individual Time Calculation (min): 70 min    Short Term Goals: Week 3:  OT Short Term Goal 1 (Week 3): Pt will remain appropriate with OT staff 90% of interactions OT Short Term Goal 2 (Week 3): Pt will complete grooming tasks with in vc for motor planning OT Short Term Goal 3 (Week 3): Pt will require no more than mod cueing to initiate basic ADLs  Skilled Therapeutic Interventions/Progress Updates:    Pt received supine in enclosure bed with no c/o pain. Pt oriented to month, not year, place. Pt completed transfer from supine > EOB with mod I. EOB to TIS w/c with CGA, poor safety awareness and requiring cueing/quick movements to make environment safe. Pt completed oral care at the sink with much improved motor planning, requiring min cueing for set up and min A overall. Ambulatory transfer into the bathroom where pt completed toileting tasks with CGA. Pt was agreeable to leave room and he was brought to the therapy gym. Pt completed BUE strengthening circuit with a 4lb dowel, dynamically hitting a ball thrown to him with cognitive component of counting with mod cueing required for attention to task. Pt requested to go back to his room and he returned to bed. Pt consumed a magic cup and a cup of nectar thick tea. Pt completed cognitive task- following a two step sequence with pegs, he required max cueing to initiate pattern but after reference picture was removed and task was set up for reciprocal turn taking he was more successful in following pattern. Pt was left supine in bed with all needs met. Enclosure bed secured.   Therapy Documentation Precautions:  Precautions Precautions: Fall Precaution Comments: no longer has PEG Other Brace: prevalon boot  for wound on pt's R heel Restrictions Weight Bearing Restrictions:  No RLE Weight Bearing: Weight bearing as tolerated Other Position/Activity Restrictions: may progress to WBAT per ortho 05/21/20   Therapy/Group: Individual Therapy  Curtis Sites 06/02/2020, 6:15 AM

## 2020-06-03 LAB — CBC
HCT: 36.2 % — ABNORMAL LOW (ref 39.0–52.0)
Hemoglobin: 12.2 g/dL — ABNORMAL LOW (ref 13.0–17.0)
MCH: 29.8 pg (ref 26.0–34.0)
MCHC: 33.7 g/dL (ref 30.0–36.0)
MCV: 88.5 fL (ref 80.0–100.0)
Platelets: 363 10*3/uL (ref 150–400)
RBC: 4.09 MIL/uL — ABNORMAL LOW (ref 4.22–5.81)
RDW: 14 % (ref 11.5–15.5)
WBC: 5.9 10*3/uL (ref 4.0–10.5)
nRBC: 0 % (ref 0.0–0.2)

## 2020-06-03 LAB — PREALBUMIN: Prealbumin: 18.5 mg/dL (ref 18–38)

## 2020-06-03 LAB — BASIC METABOLIC PANEL
Anion gap: 10 (ref 5–15)
BUN: 8 mg/dL (ref 6–20)
CO2: 25 mmol/L (ref 22–32)
Calcium: 8.7 mg/dL — ABNORMAL LOW (ref 8.9–10.3)
Chloride: 102 mmol/L (ref 98–111)
Creatinine, Ser: 0.85 mg/dL (ref 0.61–1.24)
GFR, Estimated: >60 mL/min (ref >60)
Glucose, Bld: 99 mg/dL (ref 70–99)
Potassium: 3.9 mmol/L (ref 3.5–5.1)
Sodium: 137 mmol/L (ref 135–145)

## 2020-06-03 MED ORDER — QUETIAPINE FUMARATE 50 MG PO TABS
50.0000 mg | ORAL_TABLET | Freq: Every day | ORAL | Status: DC
  Administered 2020-06-04 (×2): 50 mg via ORAL
  Filled 2020-06-03: qty 1
  Filled 2020-06-03: qty 2

## 2020-06-03 NOTE — Progress Notes (Signed)
Patient ID: Adam Bates, male   DOB: 1970-10-15, 50 y.o.   MRN: [id number]  SW left message for pt wife [name] [phone]) to provide updates from team conference, and change on d/c date from 2/8 to 2/15. SW waiting on follow-up.  *SW spoke with pt wife [name] to inform on gains made, and still working on improving pt agitation. She reported concerns related to not having a bed downstairs and aware that he is not able to walk up stairs. She will explore what she is able to put in place so he can have a bed downstairs. Family edu scheduled for Monday 2/7 9am-12pm. She reports that at one of her sessions she may bring their 43 y.o. son. States patient's brother may be able to provide support PRN, however, she will be his primary caregiver. SW encouraged her to take pictures of areas in the home she has concerns about so she can speak with therapy. SW to leave home measurement sheet in the room so she can be prepared for family edu.   [name], MSW, [place] Office: [phone] Cell: [phone] Fax: [phone]

## 2020-06-03 NOTE — Progress Notes (Signed)
Speech Language Pathology Daily Session Note  Patient Details  Name: Adam Bates MRN: [id number] Date of Birth: [birth year]  Today's Date: 06/03/2020 SLP Individual Time: 1105-1130 SLP Individual Time Calculation (min): 25 min  Short Term Goals: Week 4: SLP Short Term Goal 1 (Week 4): Patient will consume current diet with minimal overt s/s of aspiration and overall supervision verbal cues for use of swallowing compensatory strategies. SLP Short Term Goal 2 (Week 4): Patient will demonstrate sustained attention to functional tasks for 10 minutes with Mod verbal cues for redirection. SLP Short Term Goal 3 (Week 4): Patient will utilize external aids for orientation to time, place and situation with Max verbal and visual cues. SLP Short Term Goal 4 (Week 4): Patient will demonstrate functional problem solving for basic and familiar tasks with Mod verbal cues. SLP Short Term Goal 5 (Week 4): Patient will consume trials of thin liquids via cup without overt s/s of aspiration over 3 sessions with Mod verbal cues for use of swallowing strategies to assess readiness for repeat MBS.  Skilled Therapeutic Interventions: Skilled treatment session focused on dysphagia and cognitive goals. SLP facilitated session by providing supervision verbal cues for initiation of oral care at the sink with overall Mod I for problem solving. Patient consumed trials of thin liquids without overt s/s of aspiration but has h/o silent aspiration per last MBS. Recommend repeat MBS this week to assess progress. Patient also provided with a calendar that he was able to utilize with extra time and Mod verbal and visual cues. Patient continues to demonstrate intermittent language of confusion but is overall more appropriate during social interaction. Patient left in enclosure bed with alarm on and all needs within reach. Continue with current plan of care.      Pain Pain Assessment Pain Scale: 0-10 Pain Score: 0-No pain Faces  Pain Scale: No hurt Pain Type: Surgical pain Pain Location: Leg Pain Orientation: Left;Right Pain Descriptors / Indicators: Aching Pain Frequency: Constant Pain Onset: On-going Patients Stated Pain Goal: 3 Pain Intervention(s): Medication (See eMAR)  Therapy/Group: Individual Therapy   Adam Bates 06/03/2020, 11:46 AM

## 2020-06-03 NOTE — Progress Notes (Signed)
Occupational Therapy Session Note  Patient Details  Name: Adam Bates MRN: [id number] Date of Birth: [birth year]  Today's Date: 06/03/2020 OT Individual Time: 1305-1408 OT Individual Time Calculation (min): 63 min    Short Term Goals: Week 4:  OT Short Term Goal 1 (Week 4): Pt will use external cues to increase orientation to AOx2 OT Short Term Goal 2 (Week 4): Pt will complete bathing at shower level with no more than min A OT Short Term Goal 3 (Week 4): Pt will don pants with CGA OT Short Term Goal 4 (Week 4): Pt will complete grooming task with no more than 1 cue to initiate  Skilled Therapeutic Interventions/Progress Updates:    Pt seen for session initially outside of room with nursing gathered around to assist as pt was standing with the RW and had tore a hole in his vail bed.  Mod assist to re-direct to sit down in his TIS wheelchair with therapist transporting him to the dayroom to start.  Pt not oriented to place, month, or day of the week.  He was also unaware of the reason for being here in the hospital.  When questions were asked by therapist, he would reply with language of confusion answers that did not make sense.  He was able to transfer over to the therapy mat with min guard assist.  Had him try to work on creating 4 block shape that therapist had modeled for him with foam blocks.  He was able to put them together but not in the same configuration as the example he was given.  He also was unable to correctly match the colors.  When asked what color specific blocks where he was unable to state correctly, usually stating something such as "turpentine".  When given 3 choices by therapist to what color it was however, he was able to pick the correct one 3/4 attempts.  When therapist placed 4 bean bags in front of him and went over all of the colors of them, he was not able to pick up the correct one identified by therapist.  He next completed short distance mobility with use of the RW  for support around the dayroom and back approximately 30'.  Therapist then had him transfer to the wheelchair where he was taken back to his room.  Once in the room, he was looking over in the corner and talking as if other people were in the room saying "they probably want something too"  When asked who he was talking about he pointed in the corner of the room.  Therapist then asked him "Do you see other people in here?"  He looked again and stated "No".   He continued with language of confusion but when presented with a toothbrush and toothpaste, he was able to mimic what they were for but could not state their names.  He was able to wash his face with setup and mod instructional cueing as well as complete oral care in preparation of water protocol.  He safely drank approximately 8 oz of fluid without any signs of coughing.  Frequently throughout session he would become tearful, but was unable to state specifically what was making him this way.  He was re-directed easily most of the time.  Finished session with pt positioned at the nurses station in the tilt in space wheelchair with the call button and phone in reach and safety belt in place.    Therapy Documentation Precautions:  Precautions Precautions: Fall Precaution Comments:  no longer has PEG Other Brace: prevalon boot  for wound on pt's R heel Restrictions Weight Bearing Restrictions: No RLE Weight Bearing: Weight bearing as tolerated Other Position/Activity Restrictions: may progress to WBAT per ortho 05/21/20   Pain: Pain Assessment Pain Scale: Faces Faces Pain Scale: No hurt ADL: See Care Tool Section for some details of mobility and selfcare  Therapy/Group: Individual Therapy  Toryn Mcclinton OTR/L 06/03/2020, 3:46 PM

## 2020-06-03 NOTE — Patient Care Conference (Signed)
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 Alger Simons Care Coodinator Present: [name], LCSWA;Sriram Febles Creig Hines, RN, BSN, [place] Nurse Present: Rayne Du, LPN PT Present: Magda Kiel, PT OT Present: Laverle Hobby, OT SLP Present: Weston Anna, SLP PPS Coordinator present : Ileana Ladd, Burna Mortimer, SLP     Current Status/Progress Goal Weekly Team Focus  Bowel/Bladder   Patient is Continent of bladder/bowel, LBM 06/01/20  Encourage toileting while patient is awake and cooperative.  QS/PRN assist and assess toileting needs   Swallow/Nutrition/ Hydration   Dys. 2 textures with nectar-thick liquids, water protocol  Supervision with least restrictive diet  tolerance of current diet, repeat MBS   ADL's   Improvement in agitation. Still with severe attention and motor planning/sequencing deficits. Min A ADLs overall  supervision  ADL retraining, agitation reduction/management, motor planning, family education   Mobility   CGA-min A overall, gait 180 ft, 8 steps B rails, limited by attention, safety awareness, and cognitive deficits (improving)  Supervision overall  Functional mobility, gait and stair training, activity tolerance R LE strength/ROM, balance, cognitive remediation, dual task challenge, path finding, patient/caregiver education   Communication              Safety/Cognition/ Behavioral Observations  [place] Level IV-V, Mod-Max A  Min A  orientation, basic problem solving, sustained attention   Pain   c/o back and right leg pain, pain sclar 4-5/10 on pain scale,medicated with Oxy and Tylenol per orders  Pain free as much as possible.  Continue to assess and evaluate pain QS/PRN   Skin   Right heel unstageable- open air, continue current treatment  Continue healing with no new breakdown or irritation.  QS/PRN assessment     Discharge Planning:  Pt to d/c to home with his wife and their four teenage children. Additional supports to be determined. Pt would benefit from staying at hospital until appropriate for outpatient therapies at time of d/c due to insurance challenges with obtaining HH.   Team Discussion: Agitation improving, still confused, restless, complains of pain all over the place. Incontinent B/B, on a timed toileting schedule. Pulled PEG out last week, no signs of complications. Sleeping better. Wife expressing concerns about taking him home with his level of agitation. Patient on target to meet rehab goals: Min assist with ADL's and has supervision goals. Patient able to go up 8 steps with both handrails. On a dys2/nectar thick diet, water protocol. Plan to do another MBS. Continue to work on problem solving, cognition, and sustained attention.  *See Care Plan and progress notes for long and short-term goals.   Revisions to Treatment Plan:  Updated behavorial plan Teaching Needs: Family education, medication management, timed toileting, transfer training, gait training, behavior management  Current Barriers to Discharge: Inaccessible home environment, Decreased caregiver support, Home enviroment access/layout, Incontinence, Lack of/limited family support, Weight bearing restrictions,  Medication compliance and Behavior  Possible Resolutions to Barriers: Continue current medications, continue behavior modification, provide  emotional support to patient and family.     Medical Summary Current Status: improved agitation. still confused and restless. pain better. PEG out, no bleeding sequelae. sleeping most nights  Barriers to Discharge: Behavior;Medical stability   Possible Resolutions to [employer]: agitation, behavioral mgt, pain control. daily assessment of labs, pt data   Continued Need for Acute Rehabilitation Level of Care: The patient requires daily medical management by a physician with specialized training in physical medicine and rehabilitation for the following reasons: Direction of a multidisciplinary physical rehabilitation program to maximize functional independence : Yes Medical management of patient stability for increased activity during participation in an intensive rehabilitation regime.: Yes Analysis of laboratory values and/or radiology reports with any subsequent need for medication adjustment and/or medical intervention. : Yes   I attest that I was present, lead the team conference, and concur with the assessment and plan of the team.   Cristi Loron 06/03/2020, 1:38 PM
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
## 2020-06-03 NOTE — Progress Notes (Signed)
Nurse tech walking down the hall looked into patient's room and noticed patient had created a small hole in the enclosure bed. Nurse tech went to tell charge nurse about the hole in the bed and as charge nurse and nurse tech are approaching the room, the patient has gotten out of the bed via hole in side. Attempted to allow patient to sit at nursing station while making other arrangements for bed and it only further aggravated patient. Patient extremely irritated and telling everyone to "move". Patient repeatedly asked to not attempt to stand up for risk of fall. Patient non compliant and continues to stand and walk around while screaming at staff. Therapy took over as nursing staff replaced bed and made other arrangements. Patient currently requiring 1 to 1 care so nurse tech is sitting with patient.

## 2020-06-03 NOTE — Progress Notes (Signed)
Occupational Therapy Weekly Progress Note  Patient Details  Name: Adam Bates MRN: [id number] Date of Birth: 28-Nov-1970  Beginning of progress report period: May 27, 2020 End of progress report period: June 03, 2020  Today's Date: 06/03/2020 OT Individual Time: 0930-1000 OT Individual Time Calculation (min): 30 min    Patient has met 3 of 3 short term goals.  Pt has made great improvements this week with a stedy decline in agitation. He remains significantly confused and disoriented but is more redirectable to tasks. When motivated to do so, pt is able to complete ADLs at a min A level overall but is mostly limited by deficits in motor planning, sequencing and initiation with even familiar tasks. His wife has been present for several session for general TBI education and is very involved in his care.   Patient continues to demonstrate the following deficits: muscle weakness, decreased initiation, decreased attention, decreased awareness, decreased problem solving, decreased safety awareness, decreased memory and delayed processing and decreased standing balance, decreased postural control and difficulty maintaining precautions and therefore will continue to benefit from skilled OT intervention to enhance overall performance with BADL and Reduce care partner burden.  Patient progressing toward long term goals..  Continue plan of care.  OT Short Term Goals Week 3:  OT Short Term Goal 1 (Week 3): Pt will remain appropriate with OT staff 90% of interactions OT Short Term Goal 1 - Progress (Week 3): Met OT Short Term Goal 2 (Week 3): Pt will complete grooming tasks with in vc for motor planning OT Short Term Goal 2 - Progress (Week 3): Met OT Short Term Goal 3 (Week 3): Pt will require no more than mod cueing to initiate basic ADLs OT Short Term Goal 3 - Progress (Week 3): Met Week 4:  OT Short Term Goal 1 (Week 4): Pt will use external cues to increase orientation to AOx2 OT Short  Term Goal 2 (Week 4): Pt will complete bathing at shower level with no more than min A OT Short Term Goal 3 (Week 4): Pt will don pants with CGA OT Short Term Goal 4 (Week 4): Pt will complete grooming task with no more than 1 cue to initiate  Skilled Therapeutic Interventions/Progress Updates:    Pt received sitting up in enclosure bed with no c/o pain. He requested to use the bathroom and completed ambulatory transfer with CGA overall with BUE support on the RW. Pt with improved language of confusion but still present. He was still disoriented to year and situation, but oriented to place and month. Pt completed standing level oral care with min cueing for termination. Pt requested to "go for a walk" and completed 100 ft of functional mobility at CGA level. He returned to his room and to bed. He was left supine in the enclosure bed.   Therapy Documentation Precautions:  Precautions Precautions: Fall Precaution Comments: no longer has PEG Other Brace: prevalon boot  for wound on pt's R heel Restrictions Weight Bearing Restrictions: No RLE Weight Bearing: Weight bearing as tolerated Other Position/Activity Restrictions: may progress to WBAT per ortho 05/21/20  Therapy/Group: Individual Therapy  Curtis Sites 06/03/2020, 6:30 AM
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
## 2020-06-03 NOTE — Progress Notes (Signed)
Physical Therapy Session Note  Patient Details  Name: Adam Bates MRN: [id number] Date of Birth: Aug 19, 1970  Today's Date: 06/03/2020 PT Individual Time: 1505-1605 PT Individual Time Calculation (min): 60 min   Short Term Goals: Week 4:  PT Short Term Goal 1 (Week 4): Patient will perform dynamic standing balance activity >5 min with CGA. PT Short Term Goal 2 (Week 4): Patient will recall path from day room or main gym to his room with min cues. PT Short Term Goal 3 (Week 4): Patient will progress knee ROM by 10 degrees. PT Short Term Goal 4 (Week 4): Patient will tolerate ambulating throughout sessions without use of w/c transport for improved activity tolerance.  Skilled Therapeutic Interventions/Progress Updates:    Patient in w/c with sitter in the room.  Pushed in w/c to therapy gym with pt compliant after instructions to stay in w/c.  Patient sit to stand to RW CGA for safety after given two commands, and ambulated with CGA to steps x 12' mod cues and assist for walker management as reaching for rails on steps.  Patient negotiated 12 steps with L rail with min cues for sequence.  Performed turns with mod A using RW to R x 2.  Ambulated with RW to ortho gym with CGA x 160'.  Standing for balance and attention at BITS to touch targets over full screen noting 2x longer with cues to tap targets on R. Attempted second bout of 40 targets and pt needing much longer to find targets on R so stopped activity to sit to rest, pt instructed not to talk x 2 minutes.  Patient ambulated with RW attempting to locate horseshoes on R side in room.  Able to locate 3/7 initially but instead of retrieving them he pretended to shoot them with his finger.  Then began shooting at other targets forgetting to find horseshoes needing max redirection and physical help to retrieve the horseshoes.  Patient needed 3 passes of the room to retrieve all 7.  Patient ambulated to general gym x 110' then encouraged seated rest.   Patient with increased distraction and with more jargon language so assisted in w/c to room.  Left seated in room with sitter present.   Therapy Documentation Precautions:  Precautions Precautions: Fall Precaution Comments: no longer has PEG Other Brace: prevalon boot  for wound on pt's R heel Restrictions Weight Bearing Restrictions: No RLE Weight Bearing: Weight bearing as tolerated Other Position/Activity Restrictions: may progress to WBAT per ortho 05/21/20 Pain: Pain Assessment Faces Pain Scale: Hurts little more Pain Type: Surgical pain Pain Location: Leg Pain Orientation: Right Pain Descriptors / Indicators: Discomfort Pain Onset: With Activity Pain Intervention(s): Repositioned;Rest    Therapy/Group: Individual Therapy  Reginia Naas  Gwynn, PT 06/03/2020, 5:41 PM
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
## 2020-06-04 NOTE — Progress Notes (Signed)
Speech Language Pathology Daily Session Note  Patient Details  Name: Adam Bates MRN: [id number] Date of Birth: [birth year]  Today's Date: 06/04/2020 SLP Individual Time: 1200-1255 SLP Individual Time Calculation (min): 55 min  Short Term Goals: Week 4: SLP Short Term Goal 1 (Week 4): Patient will consume current diet with minimal overt s/s of aspiration and overall supervision verbal cues for use of swallowing compensatory strategies. SLP Short Term Goal 2 (Week 4): Patient will demonstrate sustained attention to functional tasks for 10 minutes with Mod verbal cues for redirection. SLP Short Term Goal 3 (Week 4): Patient will utilize external aids for orientation to time, place and situation with Max verbal and visual cues. SLP Short Term Goal 4 (Week 4): Patient will demonstrate functional problem solving for basic and familiar tasks with Mod verbal cues. SLP Short Term Goal 5 (Week 4): Patient will consume trials of thin liquids via cup without overt s/s of aspiration over 3 sessions with Mod verbal cues for use of swallowing strategies to assess readiness for repeat MBS.  Skilled Therapeutic Interventions: Skilled treatment session focused on dysphagia and cognitive goals. SLP facilitated session by providing trials of thin liquids. Patient consumed trials without overt s/s of aspiration, recommend repeat MBS tomorrow. Patient also consumed his lunch meal of Dys. 2 textures with nectar-thick liquids without overt s/s of aspiration. However, Mod A verbal cues were needed for attention to items in right field of environment and for overall problem solving with self-feeding. SLP also facilitated session by providing overall Max A verbal and visual cues for functional problem solving during a basic calendar making task. Throughout session, patient with moderate language of confusion. Patient transferred back to bed at end of session and left with alarm on and all needs within reach. Continue with  current plan of care.      Pain No/Denies Pain  Therapy/Group: Individual Therapy  Adam Bates 06/04/2020, 3:23 PM

## 2020-06-04 NOTE — Progress Notes (Signed)
Physical Therapy Session Note  Patient Details  Name: Adam Bates MRN: [id number] Date of Birth: 1970-12-30  Today's Date: 06/04/2020 PT Individual Time: 0800-0830 and 1435-1540 PT Individual Time Calculation (min): 30 min and 65 min   Short Term Goals: Week 4:  PT Short Term Goal 1 (Week 4): Patient will perform dynamic standing balance activity >5 min with CGA. PT Short Term Goal 2 (Week 4): Patient will recall path from day room or main gym to his room with min cues. PT Short Term Goal 3 (Week 4): Patient will progress knee ROM by 10 degrees. PT Short Term Goal 4 (Week 4): Patient will tolerate ambulating throughout sessions without use of w/c transport for improved activity tolerance.  Skilled Therapeutic Interventions/Progress Updates:     Session 1: Patient in low bed asleep upon PT arrival. Patient alert and agreeable to PT session. Patient reported R lower extremity pain during session, RN made aware. PT provided repositioning, rest breaks, and distraction as pain interventions throughout session.   Therapeutic Activity: Bed Mobility: Patient performed supine to/from sit independently. Patient sat EOB x10 min with mod I for sitting balance. He ate <50% of his tray, apple sauce, and drank a chocolate thickened shake and orange juice. Provided cues for attention to task and small bites throughout. Patient disoriented to place, time, and situation, oriented patient during session.  Transfers: Patient performed sit to/from stand from the bed and toilet with CGA-close supervision using a RW. Provided verbal cues for safety awareness. Patient was continent of bladder during toileting, performed peri-care and lower body clothing management independently with CGA for balance in standing.   Gait Training:  Patient ambulated ~15 feet to/from the bathroom using RW with CGA for safety/balance. Ambulated with step-through gait pattern with antalgic gait on R. Provided verbal cues for safety  awareness, visual scanning to the R due to neglect, and management of RW.   Patient in low bed with mats bilaterally on the floor at end of session with breaks locked, bed alarm set, and all needs within reach.   Session 2:  Patient in recliner with NT in the room upon PT arrival. Patient alert and agreeable to PT session. Patient denied pain during session, however demonstrated notable grimacing and rubbing his R leg.  Therapeutic Activity: Bed Mobility: Patient performed supine to/from sit as above.  Transfers: Patient performed sit to/from stand x2 as above and squat pivot bed>arm chair with min A for safety balance due to patient performing 180 degree turn spontaneously to sit in the arm chair instead of the recliner. Provided verbal cues for safety awareness.  Gait Training:  Patient ambulated >200 and >300 feet for increased endurance and activity tolerance with R LE using RW with CGA for safety/balance. Ambulated as above. Provided verbal cues for visual scanning to the R and increased L step length to increase R weight shift and normalize gait pattern.  Manual Therapy: -Performed A/P grade 3-4 join mobilizations at patient's R posterior tibia in hook-lying 2x45 sec for increased knee flexion ROM  -Performed superior, inferior mobilizations of the R patella for improved knee ROM -Performed soft tissue mobilization of quads with significant muscle tightness and trigger points on palpation -Performed prolonged stretch into R knee flexion placing the hip at 90 degrees hip flexion for gravity assist and overpressure at the tibia for increased stretch Progressed from 70 degrees prior to interventions to 82 degrees knee flexion after interventions Played soft rock throughout to maintain a calming environment for the  patient to tolerate manual therpay  Patient compliant and pleasant throughout session, however, upon PT preparing to leave the patient in the bed, the patient sat up and refused to  return to lying in the bed. Patient became tearful and asked the therapist to stay, then became angry asking for his phone to call his wife to pick him up. Able to redirect patient to a calmer state, but unable to get the patient to return to lying. Patient spontaneously stood to sit in a chair, PT attempted to direct the patient to the recliner, however, he turn to sit in the arm chair instead. RN made aware and NT arrived to sit with the patient for safety at this time.    Therapy Documentation Precautions:  Precautions Precautions: Fall Precaution Comments: no longer has PEG Other Brace: prevalon boot  for wound on pt's R heel Restrictions Weight Bearing Restrictions: No RLE Weight Bearing: Weight bearing as tolerated Other Position/Activity Restrictions: may progress to WBAT per ortho 05/21/20   Therapy/Group: Individual Therapy  Render Marley L Lucero Ide PT, DPT  06/04/2020, 4:17 PM

## 2020-06-04 NOTE — Progress Notes (Signed)
Occupational Therapy Session Note  Patient Details  Name: Adam Bates MRN: [id number] Date of Birth: 07/30/1970  Today's Date: 06/04/2020 OT Individual Time: 1100-1200 OT Individual Time Calculation (min): 60 min    Short Term Goals: Week 4:  OT Short Term Goal 1 (Week 4): Pt will use external cues to increase orientation to AOx2 OT Short Term Goal 2 (Week 4): Pt will complete bathing at shower level with no more than min A OT Short Term Goal 3 (Week 4): Pt will don pants with CGA OT Short Term Goal 4 (Week 4): Pt will complete grooming task with no more than 1 cue to initiate  Skilled Therapeutic Interventions/Progress Updates:    Pt received sleeping soundly in his low bed. He was easily awoken with vc,no c/o pain but felt groggy for first few minutes. Bed mobility to EOB with mod I. Pt not oriented to anything but himself. Reorientation provided. Pt completed ambulatory transfer into the bathroom and was easily redirected to safely doffing clothes sitting. He transferred into the shower and completed all bathing with no further cueing for initiation and no physical assist. Pt still with language of confusion but with increased coherency and accuracy with shorts phrases. Pt transferred to EOB and donned shorts with CGA. Shirt donned (S). Pt appropriately asked for help when donning socks. Standing at the sink, pt required mod cueing for motor planning setting up oral hygiene. He was unable to spontaneously name any self care item (toothbrush, etc) but he was able to use them appropriately this session. Pt was taken via TIS w/c to the therapy gym to work on the [place] system. Focus of activity on language, attention to task and sequencing. Mod cueing overall for accuracy with basic numerical sequencing and attention to task overall. Pt was returned to his room and passed off to SLP in room.   Therapy Documentation Precautions:  Precautions Precautions: Fall Precaution Comments: no longer has  PEG Other Brace: prevalon boot  for wound on pt's R heel Restrictions Weight Bearing Restrictions: No RLE Weight Bearing: Weight bearing as tolerated Other Position/Activity Restrictions: may progress to WBAT per ortho 05/21/20  Therapy/Group: Individual Therapy  Curtis Sites 06/04/2020, 6:32 AM

## 2020-06-05 ENCOUNTER — Inpatient Hospital Stay (HOSPITAL_COMMUNITY): Payer: Managed Care, Other (non HMO)

## 2020-06-05 MED ORDER — SENNOSIDES-DOCUSATE SODIUM 8.6-50 MG PO TABS
1.0000 | ORAL_TABLET | Freq: Two times a day (BID) | ORAL | Status: DC | PRN
  Administered 2020-06-05 – 2020-06-09 (×3): 1 via ORAL
  Filled 2020-06-05 (×4): qty 1

## 2020-06-05 MED ORDER — QUETIAPINE FUMARATE 25 MG PO TABS
25.0000 mg | ORAL_TABLET | Freq: Every day | ORAL | Status: DC
  Administered 2020-06-05 – 2020-06-08 (×4): 25 mg via ORAL
  Filled 2020-06-05 (×4): qty 1

## 2020-06-05 MED ORDER — OXYCODONE HCL 5 MG PO TABS
5.0000 mg | ORAL_TABLET | Freq: Four times a day (QID) | ORAL | Status: DC | PRN
  Administered 2020-06-06 – 2020-06-08 (×2): 10 mg via ORAL
  Administered 2020-06-08: 5 mg via ORAL
  Administered 2020-06-08 – 2020-06-09 (×2): 10 mg via ORAL
  Administered 2020-06-09: 5 mg via ORAL
  Administered 2020-06-10 – 2020-06-12 (×7): 10 mg via ORAL
  Filled 2020-06-05 (×9): qty 2
  Filled 2020-06-05 (×2): qty 1
  Filled 2020-06-05 (×2): qty 2

## 2020-06-05 NOTE — Progress Notes (Signed)
Pt down for swallow study per bed. No complications noted. Pt medications given.  Sheela Stack, LPN   [id number]: Pt return to room. No complications noted.  Sheela Stack

## 2020-06-05 NOTE — Progress Notes (Signed)
Modified Barium Swallow Progress Note  Patient Details  Name: Adam Bates MRN: [id number] Date of Birth: March 06, 1971  Today's Date: 06/05/2020  Modified Barium Swallow completed.  Full report located under Chart Review in the Imaging Section.  Brief recommendations include the following:  Clinical Impression  Patient demonstrates a mild oral and pharyngeal dysphagia which is exacerbated by current cognitive deficits.  Oral phase is characterized by mildly prolonged mastication with solid textures and mild oral holding with thin liquids.    However, patient can orally contain the bolus until swallow trigger. Patient with difficulty following commands regarding sequential sips but would often take extremely large sips and utilize piecemeal swallows instead. Despite this, patient demonstrated only intermittent flash penetration with thin liquids. Recommend patient continue Dys. 2 textures and upgrade to thin liquids with full supervision.   Swallow Evaluation Recommendations       SLP Diet Recommendations: Dysphagia 2 (Fine chop) solids;Thin liquid   Liquid Administration via: Cup;Straw   Medication Administration: Whole meds with puree (may need to crush if attempts to chew pills)   Supervision: Patient able to self feed;Full supervision/cueing for compensatory strategies   Compensations: Minimize environmental distractions;Slow rate;Small sips/bites   Postural Changes: Seated upright at 90 degrees   Oral Care Recommendations: Oral care BID      Weston Anna, MA, CCC-SLP [phone]   Cacie Gaskins 06/05/2020,9:50 AM

## 2020-06-05 NOTE — Progress Notes (Addendum)
Physical Therapy Session Note  Patient Details  Name: Adam Bates Bates MRN: [id number] Date of Birth: [birth year]  Today's Date: 06/05/2020 PT Individual Time: 6503-5465 PT Individual Time Calculation (min): 40 min   Short Term Goals: Week 4:  PT Short Term Goal 1 (Week 4): Patient will perform dynamic standing balance activity >5 min with CGA. PT Short Term Goal 2 (Week 4): Patient will recall path from day room or main gym to his room with min cues. PT Short Term Goal 3 (Week 4): Patient will progress knee ROM by 10 degrees. PT Short Term Goal 4 (Week 4): Patient will tolerate ambulating throughout sessions without use of w/c transport for improved activity tolerance.  Skilled Therapeutic Interventions/Progress Updates:     Patient in bed with RN in the room upon PT arrival. Patient alert and agreeable to PT session. Patient denied pain during session.  Therapeutic Activity: Bed Mobility: Patient performed supine to/from sit with mod I with HOB slightly elevated and increased time for R lower extremity.  Transfers: Patient performed sit to/from stand from the bed, toilet, and mat table with close supervision-CGA using RW. Provided verbal cues for safe use of RW, tend to push it away when approaching to sit. Patient performed an ambulatory transfer to the bathroom, stating "I need to go to the bathroom," then whispering to PT, "I just say that sometimes when I want to get out of bed." Once in the bathroom the patient turned around to leave without toileting. When asked if he needed to void he sat on the toilet with his shorts pulled up, then stood back up without voiding and said "you can flush it, let's go."  Gait Training:  Patient ambulated >300 feet x2 using RW with CGA. Ambulated with step-through gait pattern with improved L step length today, continues to have mild antalgic gait on the R with decreased knee flexion in swing. Provided verbal cues for visual scanning to the R. Patient  demonstrated increased awareness of objects and PT on the R and tolerated a very busy hallway with lots of people. He appropriately stopped x3 to let other patient's pass and interacted with appropriate greetings to patient's and therapists that he passed. He also was able to navigate from the ortho gym back to his room with cues x2 for path finding. He ambulated forward then backwards 2x20 feet each direction following cues for task with orange cone set as visible target for distance to switch from forwards to backwards. Performed gait as above with CGA throughout.  Patient agreeable to stretching R knee in the bed at end of session. Once in the bed PT performed knee/hip flexion stretch in supine with manual over pressure 2x1 min. Patient agreeable to staying in the bed at this time.   Patient in bed at end of session with breaks locked, bed alarm set, and all needs within reach.    Therapy Documentation Precautions:  Precautions Precautions: Fall Precaution Comments: no longer has PEG Other Brace: prevalon boot  for wound on pt's R heel Restrictions Weight Bearing Restrictions: No RLE Weight Bearing: Weight bearing as tolerated Other Position/Activity Restrictions: may progress to WBAT per ortho 05/21/20   Therapy/Group: Individual Therapy  Mico Spark L Ferlin Fairhurst PT, DPT  06/05/2020, 1:47 PM

## 2020-06-05 NOTE — Progress Notes (Signed)
Pt tolerating absence of restraints. No complications noted. Pt calm/resting.  Sheela Stack, LPN

## 2020-06-05 NOTE — Progress Notes (Signed)
Occupational Therapy Session Note  Patient Details  Name: Adam Bates MRN: [id number] Date of Birth: [birth year]  Today's Date: 06/05/2020 OT Individual Time: 1157-2620 OT Individual Time Calculation (min): 55 min    Short Term Goals: Week 1:  OT Short Term Goal 1 (Week 1): Pt will complete LB dressing with mod assist. OT Short Term Goal 1 - Progress (Week 1): Met OT Short Term Goal 2 (Week 1): Pt will follow 1-step commands during a self-care task with 50% accuracy. OT Short Term Goal 2 - Progress (Week 1): Met OT Short Term Goal 3 (Week 1): Pt will complete functional transfers with mod assist OT Short Term Goal 3 - Progress (Week 1): Met OT Short Term Goal 4 (Week 1): Pt will complete bathing with mod assist. OT Short Term Goal 4 - Progress (Week 1): Progressing toward goal  Skilled Therapeutic Interventions/Progress Updates:    1:1. Pt received in EOB with NT present. Per NT pt getting mildly antsy. Pt completes ambulation throughout session with RW after pt STS without AD and requires cues to wait to walk with AD. Pt demo difficutly with R/L discrimination today during mobility often turning opposite despite visual and tactile cues. Pt completes standing corn hole toss in dayroom and uses reacher with max multimodal cuing to pick up bean bags from floor and place into bin on R. Pt completes tossing 2x. Pt completes ambulation around RN station and takes seated rest break at Nu step chair. Pt begins to get tearful and confabulating about kids getting out and bother being gone. Pt requesting OT call wife and make sure it is all ok. Ot stepped out of pts line of sight for a minute and then reassured pt everything was OK. Pt sits at table and assists OT cleaning blocks and becomes cheerful and chatty again with OT sustaining attention to task >3 min while having conversation with severe language of confusion. Pt returns to room with wife present. Pt sits in bed, lies back and OT sets bed  alarm/exits.   Therapy Documentation Precautions:  Precautions Precautions: Fall Precaution Comments: no longer has PEG Other Brace: prevalon boot  for wound on pt's R heel Restrictions Weight Bearing Restrictions: No RLE Weight Bearing: Weight bearing as tolerated Other Position/Activity Restrictions: may progress to WBAT per ortho 05/21/20 General:   Vital Signs:   Pain: Pain Assessment Pain Scale: Faces Faces Pain Scale: No hurt Pain Location: Back Pain Orientation: Lower Pain Intervention(s): Medication (See eMAR) ADL:   Vision   Perception    Praxis   Exercises:   Other Treatments:     Therapy/Group: Individual Therapy  Tonny Branch 06/05/2020, 12:18 PM
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
## 2020-06-05 NOTE — Progress Notes (Addendum)
Restraints d/c. Pt tolerating well. Pt calm at this time. Sheela Stack, LPN

## 2020-06-05 NOTE — Progress Notes (Signed)
Pt did not require the use of restraints today. Pt required some redirection and de-escalation. Pt had a good day. Adam Stack, LPN

## 2020-06-05 NOTE — Progress Notes (Signed)
Speech Language Pathology Daily Session Note  Patient Details  Name: Adam Bates MRN: [id number] Date of Birth: [birth year]  Today's Date: 06/05/2020 SLP Individual Time: 8657-8469 SLP Individual Time Calculation (min): 30 min  Short Term Goals: Week 4: SLP Short Term Goal 1 (Week 4): Patient will consume current diet with minimal overt s/s of aspiration and overall supervision verbal cues for use of swallowing compensatory strategies. SLP Short Term Goal 2 (Week 4): Patient will demonstrate sustained attention to functional tasks for 10 minutes with Mod verbal cues for redirection. SLP Short Term Goal 3 (Week 4): Patient will utilize external aids for orientation to time, place and situation with Max verbal and visual cues. SLP Short Term Goal 4 (Week 4): Patient will demonstrate functional problem solving for basic and familiar tasks with Mod verbal cues. SLP Short Term Goal 5 (Week 4): Patient will consume trials of thin liquids via cup without overt s/s of aspiration over 3 sessions with Mod verbal cues for use of swallowing strategies to assess readiness for repeat MBS.  Skilled Therapeutic Interventions:   Patient seen for skilled ST session focusing on cognitive-linguistic goals. Patient was calm and cooperative, sitting in bed and had just finished speaking with a visitor. Using photo album, patient able to identify his wife, children and himself and did name two of his children. At one point he also was calling out "Hey [name]" but it was only him and SLP in room. Patient is completely unaware of his language errors and SLP had to interrupt to attempt clarification on some of his statements as patient was very verbose and not able to self-monitor. He had significant difficulty describing a hobby when asked, and although he made gestures, SLP was not able to deduce what he was trying to say. Patient would frequently respond, "anything you need, I'll get it for you", etc. At end of session, he  suddenly got out of bed into his recliner and started putting his shoes on (seemed he thought he was going to go somewhere with SLP?). NT arrived to supervise patient. He continues to benefit from skilled SLP intervention to maximize cognitive-linguistic and swallow function prior to discharge.  Pain Pain Assessment Pain Scale: Faces Faces Pain Scale: No hurt  Therapy/Group: Individual Therapy  Sonia Baller, MA, CCC-SLP Speech Therapy

## 2020-06-05 NOTE — Progress Notes (Signed)
Occupational Therapy Session Note  Patient Details  Name: Adam Bates MRN: [id number] Date of Birth: 26-Mar-1971  Today's Date: 06/05/2020 OT Individual Time: 1100-1155 OT Individual Time Calculation (min): 55 min    Short Term Goals: Week 1:  OT Short Term Goal 1 (Week 1): Pt will complete LB dressing with mod assist. OT Short Term Goal 1 - Progress (Week 1): Met OT Short Term Goal 2 (Week 1): Pt will follow 1-step commands during a self-care task with 50% accuracy. OT Short Term Goal 2 - Progress (Week 1): Met OT Short Term Goal 3 (Week 1): Pt will complete functional transfers with mod assist OT Short Term Goal 3 - Progress (Week 1): Met OT Short Term Goal 4 (Week 1): Pt will complete bathing with mod assist. OT Short Term Goal 4 - Progress (Week 1): Progressing toward goal  Skilled Therapeutic Interventions/Progress Updates:    1;1. Pt received in bed agreeable to OT. Pt declines bathing this date and denies pain. Pt completes ambulatory transfers throughout session with S overall and use of RW with VC for proximity to surface prior to sitting. Pt stands to play game of connect 4 with mod VC for strategy and blocking OT reducing to MIN question cues by second game. Pt dons gloves and follows multi step task of cleaning large connect 4 pieces with wipe and placing in bucket on L for visual scanning. Pt sits at high low table to sort food items into categories with 50% accuracy and 20% accuracy for naming items. Pt with no awareness of incorrect words. Exited session with pt seated in bed, exit alarm on and call light in reach   Therapy Documentation Precautions:  Precautions Precautions: Fall Precaution Comments: no longer has PEG Other Brace: prevalon boot  for wound on pt's R heel Restrictions Weight Bearing Restrictions: No RLE Weight Bearing: Weight bearing as tolerated Other Position/Activity Restrictions: may progress to WBAT per ortho 05/21/20 General:   Vital  Signs: Therapy Vitals Temp: 97.9 F (36.6 C) Pulse Rate: 75 Resp: 18 BP: 111/74 Patient Position (if appropriate): Lying Oxygen Therapy SpO2: 99 % O2 Device: Room Air Pain:   ADL:   Vision   Perception    Praxis   Exercises:   Other Treatments:     Therapy/Group: Individual Therapy  Tonny Branch 06/05/2020, 6:48 AM

## 2020-06-06 DIAGNOSIS — G479 Sleep disorder, unspecified: Secondary | ICD-10-CM

## 2020-06-06 DIAGNOSIS — N390 Urinary tract infection, site not specified: Secondary | ICD-10-CM

## 2020-06-06 DIAGNOSIS — S069X0D Unspecified intracranial injury without loss of consciousness, subsequent encounter: Secondary | ICD-10-CM

## 2020-06-06 DIAGNOSIS — D62 Acute posthemorrhagic anemia: Secondary | ICD-10-CM

## 2020-06-06 DIAGNOSIS — T148XXA Other injury of unspecified body region, initial encounter: Secondary | ICD-10-CM

## 2020-06-06 DIAGNOSIS — R451 Restlessness and agitation: Secondary | ICD-10-CM

## 2020-06-06 MED ORDER — MELATONIN 3 MG PO TABS
3.0000 mg | ORAL_TABLET | Freq: Every day | ORAL | Status: DC
  Administered 2020-06-07 – 2020-06-23 (×17): 3 mg via ORAL
  Filled 2020-06-06 (×17): qty 1

## 2020-06-06 MED ORDER — ENSURE ENLIVE PO LIQD
237.0000 mL | Freq: Two times a day (BID) | ORAL | Status: DC
  Administered 2020-06-06 – 2020-06-22 (×26): 237 mL via ORAL
  Filled 2020-06-06: qty 237
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
## 2020-06-06 NOTE — Progress Notes (Addendum)
Pt did not sleep at all through the night. Pt spent most of night sitting at nurse's station in recliner and walking with walker around the unit. Pt had a few episodes of mild agitation that was able to be de-escalated by staff.

## 2020-06-06 NOTE — Progress Notes (Signed)
Pt. Complained of muscle pain with a little agitation. PRN Robaxin and Ativan was given PO  at 2046. Pt. Was up all night confused, restless and uncooperative. Pt. Verbalized pain in legs and back. PRN oxycodone was given PO at 0010. Pt. Wandered the hallway with one- on- one supervision for safety. Pt. Was placed near nursing station in a recliner for supervision. Staff re-directed patient and utilized de-escalation measures and reassurance to provide safety. Pt. Was re-directed to his room at 0430  And went to sleep at 0500. Notified Joeseph Amor PA. AT [phone]

## 2020-06-06 NOTE — Progress Notes (Signed)
Pt doing well without restraints. No complications noted.  Sheela Stack, LPN

## 2020-06-06 NOTE — Progress Notes (Signed)
OT brought attention to pt right leg. Stated it appears more swollen more than yesterday. Measured pt right calf at 43cm. Ace wraps applied to pt. PA Dan made aware.  Sheela Stack, LPN
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
## 2020-06-06 NOTE — Progress Notes (Addendum)
Physical Therapy Session Note  Patient Details  Name: Adam Bates MRN: [id number] Date of Birth: [birth year]  Today's Date: 06/06/2020 PT Individual Time: 0930-1030 PT Individual Time Calculation (min): 60 min   Short Term Goals: Week 4:  PT Short Term Goal 1 (Week 4): Patient will perform dynamic standing balance activity >5 min with CGA. PT Short Term Goal 2 (Week 4): Patient will recall path from day room or main gym to his room with min cues. PT Short Term Goal 3 (Week 4): Patient will progress knee ROM by 10 degrees. PT Short Term Goal 4 (Week 4): Patient will tolerate ambulating throughout sessions without use of w/c transport for improved activity tolerance.  Skilled Therapeutic Interventions/Progress Updates:     Patient in recliner with NT in the room upon PT arrival. Patient alert and agreeable to PT session. Patient reported R tibial pain pointing to his shin and stating "the bone is where it hurts," during session, RN made aware. PT provided repositioning, rest breaks, and distraction as pain interventions throughout session.   Patient was oriented to place, stating "hospital," responded to situation questions with "I am here to get better" but unable to recall injuries or cause of his injuries, provided information for the patient, was able to state the correct year and stated it was "January or February" and "it's the beginning of the month." Provided the date and day of the week after. Provided positive reinforcement on answering questions today.   Patient recalled not sleeping well last night, but unable to state what he did instead of sleeping. Noted increased R lower extremity edema today, suspect due to increased ambulation around the unit last night, RN made aware.  Therapeutic Activity: Bed Mobility: Patient performed sit to supine with mod I.  Transfers: Patient performed sit to/from stand x7 with supervision using RW. Provided verbal cues for safety with RW due to  patient pushing it away before turning to sit. Patient performed ambulatory transfer to the bathroom with CGA using RW. Patient was continent of bladder, performed peri-care and lower body clothing management independently during toileting.   Gait Training:  Patient ambulated to/from the Day Room using RW with CGA-close supervision for safety. Ambulated with step-through gait pattern, mild antalgic gait on R, decreased R knee flexion in swing, and increased use of upper extremities in R stance. Provided verbal cues for erect posture, attending to his R side and visual scanning for spatial awareness, and increased R knee flexion in swing for improved step height on R.  Patient performed obstacle course for high intensity gait training. Weaving between 4 cones ~2 ft apart, walking between cones without hitting any on the R, stepping over 2 hockey sticks, turning around and performing in reverse. Patient able to follow PT demonstration without cues with CGA using RW. He performed a second time with cues/demonstration for kicking the cones on the R with his R foot for increased attention to the R, he performed as before, but hit the cones with the RW rather than his R foot and skipped 2 cones when weaving on the return. Noted increased fatigue and external distraction on second trial.   Patient performed ambulation around the nurses station with RW while locating colored/numbered dots on the R, located and retrieved 1/14 dots per instructions, patient did not report or grab any other dots, but noted that patient was looking to the R throughout and did not run into anything on the R during activity. Patient reported increased fatigue and  R tibial pain after.   Provided soft tissue mobilization to R anterior tibialis, gastroc, and quads during rest breaks for pain management, patient very appreciative reported reduced pain after.    Patient in bed listening to soft rock with lights off at end of session with  breaks locked, bed alarm set, and all needs within reach. RN and NT made aware that patient was returned to the bed at end of session.   Therapy Documentation Precautions:  Precautions Precautions: Fall Precaution Comments: no longer has PEG Other Brace: prevalon boot  for wound on pt's R heel Restrictions Weight Bearing Restrictions: No RLE Weight Bearing: Weight bearing as tolerated Other Position/Activity Restrictions: may progress to WBAT per ortho 05/21/20   Therapy/Group: Individual Therapy  Marionna Gonia L Jomo Forand PT, DPT  06/06/2020, 3:40 PM

## 2020-06-06 NOTE — Progress Notes (Signed)
Occupational Therapy Session Note  Patient Details  Name: Adam Bates MRN: [id number] Date of Birth: [birth year]  Today's Date: 06/06/2020 OT Individual Time: 1415-1530 OT Individual Time Calculation (min): 75 min    Short Term Goals: Week 3:  OT Short Term Goal 1 (Week 3): Pt will remain appropriate with OT staff 90% of interactions OT Short Term Goal 1 - Progress (Week 3): Met OT Short Term Goal 2 (Week 3): Pt will complete grooming tasks with in vc for motor planning OT Short Term Goal 2 - Progress (Week 3): Met OT Short Term Goal 3 (Week 3): Pt will require no more than mod cueing to initiate basic ADLs OT Short Term Goal 3 - Progress (Week 3): Met  Skilled Therapeutic Interventions/Progress Updates:    1:1. Pt received in hallway with NT present walking with him with handoff to OT. Pt sits at high low table and unable to follow consistent alternating color pattern peg board activity without MAX fading to MOD VC. Pt requesting to walk and uses RW with CGA-S overall for negotiating obstacles and running into items on R. Pt becomes tearful during seated rest break demoing significantly labile emotions. Pt agreeable to bathing, dressing and grooming sit to stand with CGA. Pt continues to have labile emotions thoughout shower needing constant reassurance. Pt dons clothing appropriately and grooms without suing except to locate paper towel at R of sink. Exited session with pt seated in bed, exit alarm on and call light in reach    Therapy Documentation Precautions:  Precautions Precautions: Fall Precaution Comments: no longer has PEG Other Brace: prevalon boot  for wound on pt's R heel Restrictions Weight Bearing Restrictions: No RLE Weight Bearing: Weight bearing as tolerated Other Position/Activity Restrictions: may progress to WBAT per ortho 05/21/20 General:   Vital Signs:   Pain:   ADL:   Vision   Perception    Praxis   Exercises:   Other Treatments:      Therapy/Group: Individual Therapy  Tonny Branch 06/06/2020, 1:00 PM

## 2020-06-06 NOTE — Progress Notes (Signed)
Speech Language Pathology Weekly Progress and Session Note  Patient Details  Name: TANVEER BRAMMER MRN: [id number] Date of Birth: 1970/11/20  Beginning of progress report period: May 30, 2020 End of progress report period: June 06, 2020  Today's Date: 06/06/2020 SLP Individual Time: 9767-3419 SLP Individual Time Calculation (min): 55 min  Short Term Goals: Week 4: SLP Short Term Goal 1 (Week 4): Patient will consume current diet with minimal overt s/s of aspiration and overall supervision verbal cues for use of swallowing compensatory strategies. SLP Short Term Goal 1 - Progress (Week 4): Met SLP Short Term Goal 2 (Week 4): Patient will demonstrate sustained attention to functional tasks for 10 minutes with Mod verbal cues for redirection. SLP Short Term Goal 2 - Progress (Week 4): Met SLP Short Term Goal 3 (Week 4): Patient will utilize external aids for orientation to time, place and situation with Max verbal and visual cues. SLP Short Term Goal 3 - Progress (Week 4): Met SLP Short Term Goal 4 (Week 4): Patient will demonstrate functional problem solving for basic and familiar tasks with Mod verbal cues. SLP Short Term Goal 4 - Progress (Week 4): Not met SLP Short Term Goal 5 (Week 4): Patient will consume trials of thin liquids via cup without overt s/s of aspiration over 3 sessions with Mod verbal cues for use of swallowing strategies to assess readiness for repeat MBS. SLP Short Term Goal 5 - Progress (Week 4): Met    New Short Term Goals: Week 5: SLP Short Term Goal 1 (Week 5): Patient will consume trials of Dys. 3 textures over 2 sessions with efficient mastication and complete oral clerance and without overt s/s of aspiration with supervision verbal cues prior to upgrade. SLP Short Term Goal 2 (Week 5): Patient will consume current diet of Dys.2  textures with thin liquids with minimal overt s/s of aspiration and overall Mod I for use of swallowing compensatory  strategies. SLP Short Term Goal 3 (Week 5): Patient will demonstrate sustained attention to functional tasks for 15 minutes with Mod verbal cues for redirection. SLP Short Term Goal 4 (Week 5): Patient will utilize external aids for orientation to time, place and situation with Mod verbal and visual cues. SLP Short Term Goal 5 (Week 5): Patient will demonstrate functional problem solving for basic and familiar tasks with Mod verbal cues.  Weekly Progress Updates: Patient has made slow but functional gains and has met 4 of 5 STGs this reporting period. Currently, patient is consuming Dys. 2 textures and was upgraded to thin liquids as of yesterday. Patient appears to be tolerating current diet and requires supervision verbal cues for use of swallowing compensatory strategies. Patient demonstrates behaviors consistent with a Rancho Level IV-V and requires overall Mod-Max A multimodal cues for attention, functional problem solving, recall, and awareness. Patient also continues to demonstrate severe language of confusion with intermittent confabulation and agitation at times. Patient and family education ongoing. Patient would benefit from continued skilled SLP intervention to maximize his cognitive and swallowing function prior to discharge.      Intensity: Minumum of 1-2 x/day, 30 to 90 minutes Frequency: 3 to 5 out of 7 days Duration/Length of Stay: 06/17/20 Treatment/Interventions: Cognitive remediation/compensation;Environmental controls;Cueing hierarchy;Functional tasks;Therapeutic Activities;Therapeutic Exercise;Dysphagia/aspiration precaution training;Medication managment;Patient/family education   Daily Session  Skilled Therapeutic Interventions: Skilled treatment session focused on cognitive and dysphagia goals. SLP facilitated session by providing trials of Dys. 3 textures. Patient demonstrated efficient mastication with complete oral clearance with intermittent liquid washes needed.  Patient  without overt s/s of aspiration. Recommend trial tray prior to upgrade. Patient continues to demonstrate language of confusion and today demonstrated mildly sexually inappropriate language that was easily redirected. However, patient's language appeared more functional and on topic during structured language tasks like identifying potential hazards in pictures.  Patient demonstrated sustained attention to task for ~10 minutes with Mod verbal cues for redirection. Patient transferred back to bed at end of session and left with alarm on and all needs within reach. Continue with current plan of care.     Pain No/Denies Pain   Therapy/Group: Individual Therapy  Kileigh Ortmann 06/06/2020, 2:59 PM

## 2020-06-07 NOTE — Progress Notes (Signed)
Occupational Therapy Note  Patient Details  Name: Adam Bates MRN: [id number] Date of Birth: Mar 28, 1971  Pt seen in hallway scaling wall attempting to get away from staff without AD. Pt greeted in middle of hallway with pt limping. OT redirected pt to seated chair in hallway. Pt agitated stating, "he's trying to keep me from my wife." After a  Quite a few minutes seated while being watched by NT staff, pt able to be comforted by this clinician and redirected back into room. Left in hands of NT. RN alerted pt wife was on way to visit which seemed to comfort pt.   Lowella Dell Sumedha Munnerlyn 06/07/2020, 5:04 PM

## 2020-06-07 NOTE — Progress Notes (Addendum)
Wound to patient's right heel is 3 cm long, 2 cm wide and 0.25 cm deep. Base of the wound is dark red and surrounding tissue is intact. Minimal serosanguineous drainage was noted. Wound was cleansed with NS and a new foam dressing was applied. Patient tolerated well.
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
## 2020-06-07 NOTE — Progress Notes (Signed)
Occupational Therapy Session Note  Patient Details  Name: Adam Bates MRN: [id number] Date of Birth: [birth year]  Today's Date: 06/07/2020 OT Individual Time: 1340-1435 OT Individual Time Calculation (min): 55 min    Short Term Goals: Week 3:  OT Short Term Goal 1 (Week 3): Pt will remain appropriate with OT staff 90% of interactions OT Short Term Goal 1 - Progress (Week 3): Met OT Short Term Goal 2 (Week 3): Pt will complete grooming tasks with in vc for motor planning OT Short Term Goal 2 - Progress (Week 3): Met OT Short Term Goal 3 (Week 3): Pt will require no more than mod cueing to initiate basic ADLs OT Short Term Goal 3 - Progress (Week 3): Met  Skilled Therapeutic Interventions/Progress Updates:    1:1 Pt received in recliner agreeable to OT. Pt completes ambulation with RW to/from all destinations with S overall. Pt completes oral care standing at sink with VC for locating paper towels at sink. Pt redirected to TIS and escorted to quiet gym. Pt completes seated bits task with stylus (pt uses L hand consistently). Pt traces figures, completes simple maze task (unable to do formal maze assessment), trailmaking A test 6 min 6 sec, pt unable to understand bells cancelation assessment circling all types of figures, and reaching task with locating dots in all quadrants of screen with 4.3 sec reaction time and mod VC for attention to task. Pt likely with R inattention and field cut as pt needs to lean over to R and turn head to locate stimuli on R throughout all activities. Exited session with pt seated in w/c, exit alarm on under seat and belt alarm on and call light in reach   Therapy Documentation Precautions:  Precautions Precautions: Fall Precaution Comments: no longer has PEG Other Brace: prevalon boot  for wound on pt's R heel Restrictions Weight Bearing Restrictions: Yes RLE Weight Bearing: Weight bearing as tolerated Other Position/Activity Restrictions: may progress to  WBAT per ortho 05/21/20 General:   Vital Signs: Therapy Vitals Temp: 98 F (36.7 C) Pulse Rate: 88 Resp: 17 BP: 108/73 Patient Position (if appropriate): Sitting Oxygen Therapy SpO2: 100 % O2 Device: Room Air Pain:   ADL:   Vision   Perception    Praxis   Exercises:   Other Treatments:     Therapy/Group: Individual Therapy  Tonny Branch 06/07/2020, 5:04 PM

## 2020-06-08 NOTE — Progress Notes (Signed)
Physical Therapy Weekly Progress Note  Patient Details  Name: Adam Bates MRN: [id number] Date of Birth: 1970-11-11  Beginning of progress report period: June 02, 2020 End of progress report period: June 09, 2020  Today's Date: 06/09/2020 PT Individual Time: 1115-1200 PT Individual Time Calculation (min): 45 min   Patient has met 3 of 4 short term goals.  Patient with steady progress this week performing bed mobility with mod I, transfers with supervision for safety using RW, gait >300 ft with CGA-supervision, CGA 12 steps with B rails, demonstrating improved attention, ability to follow 2-3 step instructions intermittently, and tolerating low bed without restraints with increased supervision from staff due to elopement risk. Patient with increased emotional lability this week and demonstrates significant anxiety about returning to bed in the afternoons leading to increased restlessness and poor frustration tolerance with staff. Patient continues to demonstrate decreased incite into his deficits and decreased safety awareness leading to increased fall risk at this time.   Patient continues to demonstrate the following deficits muscle weakness and muscle joint tightness, decreased cardiorespiratoy endurance, decreased coordination, decreased visual acuity, decreased attention to right, decreased attention, decreased awareness, decreased problem solving, decreased safety awareness and decreased memory and decreased sitting balance, decreased standing balance, decreased postural control, decreased balance strategies and difficulty maintaining precautions and therefore will continue to benefit from skilled PT intervention to increase functional independence with mobility.  Patient progressing toward long term goals..  Continue plan of care.  PT Short Term Goals Week 4:  PT Short Term Goal 1 (Week 4): Patient will perform dynamic standing balance activity >5 min with CGA. PT Short Term Goal 1  - Progress (Week 4): Progressing toward goal PT Short Term Goal 2 (Week 4): Patient will recall path from day room or main gym to his room with min cues. PT Short Term Goal 2 - Progress (Week 4): Met PT Short Term Goal 3 (Week 4): Patient will progress knee ROM by 10 degrees. PT Short Term Goal 3 - Progress (Week 4): Met PT Short Term Goal 4 (Week 4): Patient will tolerate ambulating throughout sessions without use of w/c transport for improved activity tolerance. PT Short Term Goal 4 - Progress (Week 4): Met Week 5:  PT Short Term Goal 1 (Week 5): Patient will perform dynamic standing balance activity >5 min with CGA. PT Short Term Goal 2 (Week 5): Patient will tolerate >75% of sessions outside of his room with min A for attention. PT Short Term Goal 3 (Week 5): Patient will perform 6 Minute Walk Test with min cues for attention to task using LRAD and supervision.  Skilled Therapeutic Interventions/Progress Updates:     Patient sitting EOB with his wife and daughter present for family education upon PT arrival. Patient alert and agreeable to PT session. Patient reported R knee/thigh pain during session, RN made aware. PT provided repositioning, rest breaks, and distraction as pain interventions throughout session.   Patient visibly upset and tearful at beginning of session. Patient's family explained that he thought that he would be going home today after family education. They also explained that he had not had much sleep last night and a very busy morning. Focused session on general TBI education, Rancho levels of recovery and strategies for managing behaviors and for patient to communicate with staff and family. Also discussed home set-up and safety. Patient's wife plans to have the patient remain downstairs at d/c for safety due to concerns about patient's safety on the stairs. Discussed having  his wife sleep in the same room, benefits and disadvantages of bed rails for safety, and therapy goals  at d/c. Discussed benefits of HH therapies at d/c for improved transfer home, however, discussed potential barriers to [facility] therapies and educated on OP therapies as well.   Patient expressed concerns about interactions with staff. Stating that they are lying to him and that it is very confusing. Patient with improved recall of information, stated that his wife comes in the evenings and recalls ambulating into the hallway without assistance throughout the night/morning. Will discuss appropriate strategies for redirection with patient with nursing staff for improved interactions. Also promoted frequent orientation by family and staff as patient continues to progress with his recovery.   Patient requested to ambulate in the hall due to increased R knee stiffness. He performed transfers and ambulation >100 ft using RW with close supervision for safety. Ambulated with decreased gait speed and antalgic gait on the R with step-through gait pattern. Provided cues for safe proximity to RW and visual scanning of environment. Demonstrated safe guarding technique to family throughout.   Patient very fatigued and demonstrating signs of overstimulation with poor frustration tolerance. Patient tolerated >2.5 hours of therapy in a row as well as visitation from his family. Provided education on benefits of taking mental breaks and providing a low stim environment for the patient.   Patient and family attentive and appreciative of education throughout session.   Patient sitting EOB with his daughter and RN in the room at end of session with breaks locked, bed alarm set, and all needs within reach.    Therapy Documentation Precautions:  Precautions Precautions: Fall Precaution Comments: no longer has PEG Other Brace: prevalon boot  for wound on pt's R heel Restrictions Weight Bearing Restrictions: Yes RLE Weight Bearing: Weight bearing as tolerated Other Position/Activity Restrictions: may progress to WBAT per  ortho 05/21/20 General: PT Amount of Missed Time (min): 15 Minutes PT Missed Treatment Reason: Patient fatigue  Therapy/Group: Individual Therapy  Debanhi Blaker L Shmuel Girgis PT, DPT  06/09/2020, 4:45 PM

## 2020-06-08 NOTE — Progress Notes (Signed)
Physical Therapy Session Note  Patient Details  Name: Adam Bates MRN: [id number] Date of Birth: 1971/02/18  Today's Date: 06/08/2020 PT Individual Time: 1515-1550 PT Individual Time Calculation (min): 35 min   Short Term Goals: Week 4:  PT Short Term Goal 1 (Week 4): Patient will perform dynamic standing balance activity >5 min with CGA. PT Short Term Goal 2 (Week 4): Patient will recall path from day room or main gym to his room with min cues. PT Short Term Goal 3 (Week 4): Patient will progress knee ROM by 10 degrees. PT Short Term Goal 4 (Week 4): Patient will tolerate ambulating throughout sessions without use of w/c transport for improved activity tolerance.  Skilled Therapeutic Interventions/Progress Updates:     Patient in recliner with his wife in the room upon PT arrival. Patient alert and agreeable to PT session. Patient denied pain during session.  Patient verbose with confabulatory speech throughout session with intermittent language of confusion. Demonstrated mild incite into deficits and awareness of behaviors over the past few days. Also, recalled seeing his pastor in the acute setting and perseverated on talking about his love for Jesus and spirituality throughout session. Demonstrated increased visual scanning to the R and visual acuity with pictures in the room. Patient able to reach therapist's name on a badge this session. Patient's wife confirmed that the patient is L handed and participated in family education throughout session.   Educated patient and his wife on Rancho levels of recovery and discussed characteristics of Rancho IV, V, and VI to identify stages he has been and behaviors to look out for as he progresses through his recovery.   Therapeutic Activity: Transfers: Patient performed sit to/from stand x3 with supervision-CGA using RW. Provided verbal cues for safety awareness with AD, patient able to follow cues to keep the RW close to him when completing a  turn to sit this session.  Gait Training:  Patient ambulated >120 feet from his room to the second door of the therapy gym using RW with CGA. Ambulated with decreased gait speed, mild antalgic gait on the R, decreased R knee flexion in swing, and frequently stopping for appropriate social interactions with staff or patients. Provided verbal cues for safe guarding technique, safety awareness, and visual scanning R x1 to locate the door to exit the gym.  Patient ascended/descended 8 steps using B rails throughout despite cues for using 1 rail to simulate home set-up with CGA. Performed step-to gait pattern leading with L while ascending and R while descending. Provided cues for technique and sequencing and demonstrated safe guarding technique on downhill side of patient for patient's wife throughout.   Discussed setting up family education with patient and his children outside. Patient's wife stated that the kids do Facetime with the patient in the evenings. Will continue to discuss further family education during therapies tomorrow.   Patient in recliner with his wife in the room at end of session with breaks locked and all needs within reach.    Therapy Documentation Precautions:  Precautions Precautions: Fall Precaution Comments: no longer has PEG Other Brace: prevalon boot  for wound on pt's R heel Restrictions Weight Bearing Restrictions: Yes RLE Weight Bearing: Weight bearing as tolerated Other Position/Activity Restrictions: may progress to WBAT per ortho 05/21/20   Therapy/Group: Individual Therapy  Adam Bates L Adam Bates PT, DPT  06/08/2020, 5:28 PM

## 2020-06-08 NOTE — Progress Notes (Signed)
Patient refused to finish all his night time medications. States they "upset my stomach". Currently resting in bed with call bell at bedside and bed alarm on. Has slept well over night this weekend.

## 2020-06-09 MED ORDER — OLANZAPINE 10 MG PO TABS
10.0000 mg | ORAL_TABLET | Freq: Every day | ORAL | Status: DC
  Administered 2020-06-09 – 2020-06-23 (×15): 10 mg via ORAL
  Filled 2020-06-09 (×15): qty 1

## 2020-06-09 MED ORDER — METHYLPHENIDATE HCL 5 MG PO TABS
15.0000 mg | ORAL_TABLET | Freq: Two times a day (BID) | ORAL | Status: DC
  Administered 2020-06-09 – 2020-06-13 (×5): 15 mg via ORAL
  Filled 2020-06-09 (×5): qty 3

## 2020-06-09 NOTE — Progress Notes (Addendum)
Occupational Therapy Session Note  Patient Details  Name: Adam Bates MRN: [id number] Date of Birth: Apr 04, 1971  Today's Date: 06/09/2020 OT Individual Time: 0900-1000 OT Individual Time Calculation (min): 60 min    Short Term Goals: Week 4:  OT Short Term Goal 1 (Week 4): Pt will use external cues to increase orientation to AOx2 OT Short Term Goal 2 (Week 4): Pt will complete bathing at shower level with no more than min A OT Short Term Goal 3 (Week 4): Pt will don pants with CGA OT Short Term Goal 4 (Week 4): Pt will complete grooming task with no more than 1 cue to initiate  Skilled Therapeutic Interventions/Progress Updates:    pt received supine with no c/o pain. Pt agreeable to Ot session focused on shower. His wife and daughter arrived shortly thereafter for scheduled family edu session. Pt required mod cueing for initiation of tasks throughout session. Ambulatory transfer into the bathroom with supervision using RW. Mod cueing for doffing of clothes. He required only min cueing for washing his bottom in the shower and otherwise completed all other bathing with supervision seated. He returned to EOB and dressed with supervision for UB, min A for pants. Mod cueing for attention to task. Extensive discussion re TBI education, including pt specific deficits, safety in the home, interactions with his children, general recovery, and importance of a daily routine. Pt was passed off to SLP in room.   Therapy Documentation Precautions:  Precautions Precautions: Fall Precaution Comments: no longer has PEG Other Brace: prevalon boot  for wound on pt's R heel Restrictions Weight Bearing Restrictions: Yes RLE Weight Bearing: Weight bearing as tolerated Other Position/Activity Restrictions: may progress to WBAT per ortho 05/21/20  Therapy/Group: Individual Therapy  Curtis Sites 06/09/2020, 6:49 AM
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
## 2020-06-09 NOTE — Progress Notes (Signed)
Patient agitated this morning, refused 6am medications and to change soiled pants. Was awake on and off throughout the night. Currently sitting up in recliner with chair alarm on and call bell at chair side.

## 2020-06-09 NOTE — Progress Notes (Signed)
Speech Language Pathology Daily Session Note  Patient Details  Name: Adam Bates MRN: [id number] Date of Birth: 01/18/1971  Today's Date: 06/09/2020 SLP Individual Time: 1000-1100 SLP Individual Time Calculation (min): 60 min  Short Term Goals: Week 5: SLP Short Term Goal 1 (Week 5): Patient will consume trials of Dys. 3 textures over 2 sessions with efficient mastication and complete oral clerance and without overt s/s of aspiration with supervision verbal cues prior to upgrade. SLP Short Term Goal 2 (Week 5): Patient will consume current diet of Dys.2  textures with thin liquids with minimal overt s/s of aspiration and overall Mod I for use of swallowing compensatory strategies. SLP Short Term Goal 3 (Week 5): Patient will demonstrate sustained attention to functional tasks for 15 minutes with Mod verbal cues for redirection. SLP Short Term Goal 4 (Week 5): Patient will utilize external aids for orientation to time, place and situation with Mod verbal and visual cues. SLP Short Term Goal 5 (Week 5): Patient will demonstrate functional problem solving for basic and familiar tasks with Mod verbal cues.  Skilled Therapeutic Interventions: Skilled treatment session focused on family education with the patient, his wife and his daughter. SLP facilitated session by providing trials of regular textures. Patient demonstrated efficient mastication and complete oral clearance without overt s/s of aspiration. Recommend trial tray prior to upgrade. SLP provided education regarding general aspiration precautions, swallowing compensatory strategies and medication administration. She verbalized understanding. SLP also educated patient and his family regarding patient's current cognitive functioning and the importance of 24 hour supervision and using a schedule/routine to maximize safety and independence. SLP also provided strategies to maximize safety at night and de-escalation strategies. All verbalized  understanding but reinforcement and continued education is needed. Patient left sitting EOB with family present and all needs within reach. Continue with current plan of care.      Pain No/Denies Pain   Therapy/Group: Individual Therapy  Paulita Licklider, [place] 06/09/2020, 12:19 PM

## 2020-06-10 NOTE — Progress Notes (Signed)
Occupational Therapy Weekly Progress Note  Patient Details  Name: Adam Bates MRN: [id number] Date of Birth: May 15, 1970  Beginning of progress report period: June 03, 2020 End of progress report period: June 10, 2020  Today's Date: 06/10/2020 OT Individual Time: 1345-1500 OT Individual Time Calculation (min): 75 min    Patient has met 3 of 4 short term goals. Pt continues to make good progress cognitively with reduced agitation, improved attention to task, improved motor planning, and overall independence with ADLs. He still requires min A with LB dressing but is more consistently asking for assist, demonstrating some improvements in awareness. UB ADLs are at a supervision level. Cueing is required for initiation of tasks.   Patient continues to demonstrate the following deficits: muscle weakness, decreased motor planning and ideational apraxia, decreased initiation, decreased attention, decreased awareness, decreased problem solving, decreased safety awareness, decreased memory and delayed processing and decreased sitting balance, decreased standing balance, decreased postural control and decreased balance strategies and therefore will continue to benefit from skilled OT intervention to enhance overall performance with BADL and Reduce care partner burden.  Patient progressing toward long term goals..  Continue plan of care.  OT Short Term Goals Week 4:  OT Short Term Goal 1 (Week 4): Pt will use external cues to increase orientation to AOx2 OT Short Term Goal 1 - Progress (Week 4): Not met OT Short Term Goal 2 (Week 4): Pt will complete bathing at shower level with no more than min A OT Short Term Goal 2 - Progress (Week 4): Met OT Short Term Goal 3 (Week 4): Pt will don pants with CGA OT Short Term Goal 3 - Progress (Week 4): Met OT Short Term Goal 4 (Week 4): Pt will complete grooming task with no more than 1 cue to initiate OT Short Term Goal 4 - Progress (Week 4): Met Week 5:   OT Short Term Goal 1 (Week 5): Pt will use external cues to increase orientation to x2 OT Short Term Goal 2 (Week 5): Pt will increase attention to task as demonstrated by requiring no more than min cueing during bathing OT Short Term Goal 3 (Week 5): Pt will ask for assist when needed during ADLs with no more than min cueing to demo improved self-awareness  Skilled Therapeutic Interventions/Progress Updates:    Pt received in recliner with no c/o pain. Pt agreeable to OT session. Much improved language of confusion this session. Pt appropriate with therapist throughout session. Pr oriented to year, month, and place when given choices d/t difficulty finding word for hospital. Pt completed ambulatory transfer into the bathroom and shower with close supervision. Min cueing for safety when doffing clothes. Min cueing for thoroughness with bathing but otherwise no assist needed. Pt completed LB dressing with min A. UB ADls with supervision. Pt initiated playing card game with OT. He was able to accurately deal 7 cards and began game with no further cueing, however was unable to carryon game or tell OT rules of the game. He completed 200 ft of functional mobility to the therapy gym with one standing rest break, with close supervision using the RW. Pt completed card game "war" and was able to accurately say whether he or OT won the round with 75% accuracy and min cueing for attention to task toward the end of the game. Pt completed NuStep for 10 min for dual processing task and to increase R knee flexion. He required max cueing to find animals when given first letter to  start with and had several nonsense words. Pt was able to complete counting by two's from 0-70! He required min cueing for attention/focus to task. He then completed several activities on the BITS with max cueing for attention. Pt completed functional mobility back to his room, stopping to sort utensils, which he was 100% accurate. Pt returned to his  room and was left sitting up with both chair pad alarm and seat belt alarm on.   Therapy Documentation Precautions:  Precautions Precautions: Fall Precaution Comments: no longer has PEG Other Brace: prevalon boot  for wound on pt's R heel Restrictions Weight Bearing Restrictions: No RLE Weight Bearing: Weight bearing as tolerated Other Position/Activity Restrictions: may progress to WBAT per ortho 05/21/20   Therapy/Group: Individual Therapy  Curtis Sites 06/10/2020, 6:18 AM

## 2020-06-10 NOTE — Plan of Care (Signed)
  Problem: RH Problem Solving Goal: LTG Patient will demonstrate problem solving for (SLP) Description: LTG:  Patient will demonstrate problem solving for basic/complex daily situations with cues  (SLP) Flowsheets (Taken 06/10/2020 0612) LTG: Patient will demonstrate problem solving for (SLP): Basic daily situations Note: Goal downgraded due to slow progress   Problem: RH Memory Goal: LTG Patient will use memory compensatory aids to (SLP) Description: LTG:  Patient will use memory compensatory aids to recall biographical/new, daily complex information with cues (SLP) Flowsheets (Taken 06/10/2020 0612) LTG: Patient will use memory compensatory aids to (SLP): Moderate Assistance - Patient 50 - 74% Note: Goal downgraded due to slow progress   Problem: RH Awareness Goal: LTG: Patient will demonstrate awareness during functional activites type of (SLP) Description: LTG: Patient will demonstrate awareness during functional activites type of (SLP) Flowsheets (Taken 06/10/2020 0612) Patient will demonstrate during cognitive/linguistic activities awareness type of: Intellectual LTG: Patient will demonstrate awareness during cognitive/linguistic activities with assistance of (SLP): Moderate Assistance - Patient 50 - 74% Note: Goal downgraded due to slow progress
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
## 2020-06-10 NOTE — Progress Notes (Signed)
Speech Language Pathology Daily Session Note  Patient Details  Name: Adam Bates MRN: [id number] Date of Birth: 17-Jan-1971  Today's Date: 06/10/2020 SLP Individual Time: 1130-1200 SLP Individual Time Calculation (min): 30 min  Short Term Goals: Week 5: SLP Short Term Goal 1 (Week 5): Patient will consume trials of Dys. 3 textures over 2 sessions with efficient mastication and complete oral clerance and without overt s/s of aspiration with supervision verbal cues prior to upgrade. SLP Short Term Goal 2 (Week 5): Patient will consume current diet of Dys.2  textures with thin liquids with minimal overt s/s of aspiration and overall Mod I for use of swallowing compensatory strategies. SLP Short Term Goal 3 (Week 5): Patient will demonstrate sustained attention to functional tasks for 15 minutes with Mod verbal cues for redirection. SLP Short Term Goal 4 (Week 5): Patient will utilize external aids for orientation to time, place and situation with Mod verbal and visual cues. SLP Short Term Goal 5 (Week 5): Patient will demonstrate functional problem solving for basic and familiar tasks with Mod verbal cues.  Skilled Therapeutic Interventions: Skilled treatment session focused on dysphagia and cognitive goals. SLP facilitated session by providing skilled observation with trial tray of regular textures with thin liquids. Patient consumed meal with minimal overt s/s of aspiration and supervision level verbal cues for use of swallowing compensatory strategies. Recommend patient upgrade to regular textures. Patient began to scoot to the edge of the bed and requested to use the bathroom. Mod verbal cues were needed for safety with task regarding donning shoes and overall impulsivity. Patient ambulated to the bathroom with the RW with supervision and was continent of bladder. Patient left in recliner with chair alarms on and all needs within reach. Continue with current plan of care.      Pain No/Denies  Pain   Therapy/Group: Individual Therapy  Rabab Currington 06/10/2020, 12:36 PM
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
## 2020-06-11 NOTE — Progress Notes (Signed)
Speech Language Pathology Daily Session Note  Patient Details  Name: Adam Bates MRN: [id number] Date of Birth: November 20, 1970  Today's Date: 06/11/2020 SLP Individual Time: 1030-1110 SLP Individual Time Calculation (min): 40 min  Short Term Goals: Week 5: SLP Short Term Goal 1 (Week 5): Patient will consume trials of Dys. 3 textures over 2 sessions with efficient mastication and complete oral clerance and without overt s/s of aspiration with supervision verbal cues prior to upgrade. SLP Short Term Goal 2 (Week 5): Patient will consume current diet of Dys.2  textures with thin liquids with minimal overt s/s of aspiration and overall Mod I for use of swallowing compensatory strategies. SLP Short Term Goal 3 (Week 5): Patient will demonstrate sustained attention to functional tasks for 15 minutes with Mod verbal cues for redirection. SLP Short Term Goal 4 (Week 5): Patient will utilize external aids for orientation to time, place and situation with Mod verbal and visual cues. SLP Short Term Goal 5 (Week 5): Patient will demonstrate functional problem solving for basic and familiar tasks with Mod verbal cues.  Skilled Therapeutic Interventions: Skilled treatment session focused on cognitive goals. SLP facilitated session by providing overall Min A verbal cues for problem solving and self-monitoring of errors during a basic medication management task of organizing a BID pill box by time of day. SLP also facilitated session by providing Mod A verbal cues for patient to recall and transcribe appointments onto a calendar.  Overall, patient demonstrated appropriate attention to all tasks with decreased language of confusion noted and improved social interactions. Patient left upright in the recliner with alarm on and all needs within reach. Continue with current plan of care.      Pain No/Denies Pain   Therapy/Group: Individual Therapy  Autym Siess 06/11/2020, 12:47 PM

## 2020-06-11 NOTE — Progress Notes (Signed)
Occupational Therapy Session Note  Patient Details  Name: DAELAN GATT MRN: [id number] Date of Birth: 03/08/1971  Today's Date: 06/11/2020 OT Individual Time: 6067-7034 OT Individual Time Calculation (min): 26 min    Short Term Goals: Week 1:  OT Short Term Goal 1 (Week 1): Pt will complete LB dressing with mod assist. OT Short Term Goal 1 - Progress (Week 1): Met OT Short Term Goal 2 (Week 1): Pt will follow 1-step commands during a self-care task with 50% accuracy. OT Short Term Goal 2 - Progress (Week 1): Met OT Short Term Goal 3 (Week 1): Pt will complete functional transfers with mod assist OT Short Term Goal 3 - Progress (Week 1): Met OT Short Term Goal 4 (Week 1): Pt will complete bathing with mod assist. OT Short Term Goal 4 - Progress (Week 1): Progressing toward goal  Skilled Therapeutic Interventions/Progress Updates:    1:1. Pt received in bed agreeable to finish ordering lunch with mod VC for options. Pt reporting need to toilet. Pt completes at ambulatory level with setup with grood RW management. Pt completes ambulation to/from all tx destinations with S and VC for path-finding using external aides. Pt completes 3 simple pipe tree figures initially with only needed pieces out on table, then extra pieces laid on table and pt able to select correct pieces to build simple figure. Exited session with pt seated in recliner, exit alarm on and call light in reach   Therapy Documentation Precautions:  Precautions Precautions: Fall Precaution Comments: no longer has PEG Other Brace: prevalon boot  for wound on pt's R heel Restrictions Weight Bearing Restrictions: No RLE Weight Bearing: Weight bearing as tolerated Other Position/Activity Restrictions: may progress to WBAT per ortho 05/21/20 General:   Vital Signs: Therapy Vitals Temp: 98.7 F (37.1 C) Pulse Rate: 71 Resp: 14 BP: 100/65 Patient Position (if appropriate): Lying Oxygen Therapy SpO2: 99 % O2 Device:  Room Air Pain:   ADL:   Vision   Perception    Praxis   Exercises:   Other Treatments:     Therapy/Group: Individual Therapy  Tonny Branch 06/11/2020, 6:59 AM

## 2020-06-11 NOTE — Progress Notes (Signed)
Occupational Therapy Session Note  Patient Details  Name: Adam Bates MRN: [id number] Date of Birth: 03/01/1971  Today's Date: 06/11/2020 OT Individual Time: 1130-1200 OT Individual Time Calculation (min): 30 min    Short Term Goals: Week 5:  OT Short Term Goal 1 (Week 5): Pt will use external cues to increase orientation to x2 OT Short Term Goal 2 (Week 5): Pt will increase attention to task as demonstrated by requiring no more than min cueing during bathing OT Short Term Goal 3 (Week 5): Pt will ask for assist when needed during ADLs with no more than min cueing to demo improved self-awareness  Skilled Therapeutic Interventions/Progress Updates:    Pt received standing up from recliner with chair alarm going off, reaching for his walker. He was apologetic and happy to see OT for session. Pt pleasant, cooperative, and oriented to year, month, and person. Language of confusion present throughout session with focus placed on naming ADL related items throughout with min cueing. Pt completed ambulatory transfer into the shower with supervision. He required close supervision and mod cueing for thoroughness/inititation of bathing. He donned underwear and shorts with CGA, shirt with supervision. Oral care with supervision in standing. Improved motor planning overall. Pt was left sitting up in the recliner with the chair alarm belt set.   Therapy Documentation Precautions:  Precautions Precautions: Fall Precaution Comments: no longer has PEG Other Brace: prevalon boot  for wound on pt's R heel Restrictions Weight Bearing Restrictions: No RLE Weight Bearing: Weight bearing as tolerated Other Position/Activity Restrictions: may progress to WBAT per ortho 05/21/20   Therapy/Group: Individual Therapy  Curtis Sites 06/11/2020, 6:20 AM

## 2020-06-11 NOTE — Progress Notes (Signed)
Patient ID: Adam Bates, male   DOB: [birth year], 50 y.o.   MRN: [id number]  SW spoke with pt wife [name] [phone]) to discuss scheduling conflict. Family meeting moved to Monday 2/14 9am-9:30am.   [name], MSW, [place] Office: [phone] Cell: [phone] Fax: [phone]

## 2020-06-12 NOTE — Progress Notes (Signed)
Speech Language Pathology Daily Session Note  Patient Details  Name: Adam Bates MRN: [id number] Date of Birth: Mar 14, 1971  Today's Date: 06/12/2020 SLP Individual Time: 0725-0810 SLP Individual Time Calculation (min): 45 min  Short Term Goals: Week 5: SLP Short Term Goal 1 (Week 5): Patient will consume trials of Dys. 3 textures over 2 sessions with efficient mastication and complete oral clerance and without overt s/s of aspiration with supervision verbal cues prior to upgrade. SLP Short Term Goal 2 (Week 5): Patient will consume current diet of Dys.2  textures with thin liquids with minimal overt s/s of aspiration and overall Mod I for use of swallowing compensatory strategies. SLP Short Term Goal 3 (Week 5): Patient will demonstrate sustained attention to functional tasks for 15 minutes with Mod verbal cues for redirection. SLP Short Term Goal 4 (Week 5): Patient will utilize external aids for orientation to time, place and situation with Mod verbal and visual cues. SLP Short Term Goal 5 (Week 5): Patient will demonstrate functional problem solving for basic and familiar tasks with Mod verbal cues.  Skilled Therapeutic Interventions: Skilled treatment session focused dysphagia and cognitive goals. SLP facilitated session by providing skilled observation with breakfast meal of regular textures with thin liquids. Patient consumed meal without overt s/s of aspiration and requires overall supervision level verbal cues for problem solving with tray set-up and attention to self-feeding. Recommend patient continue current diet. Patient had a visitor present during some of the session resulting in mildly inappropriate social interactions. However, patient independently recalled who the visitor was and attempted to engage the visitor in treatment session. SLP also facilitated session by providing Min A verbal cues for problem solving and safety with basic self-care tasks at the sink and while donning  new pants. Patient left upright in recliner with alarm on and all needs within reach. Continue with current plan of care.      Pain Pain Assessment Pain Scale: 0-10 Pain Score: 0-No pain  Therapy/Group: Individual Therapy  Millette Halberstam 06/12/2020, 12:45 PM
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
## 2020-06-12 NOTE — Progress Notes (Signed)
Patient ID: Adam Bates, male   DOB: 1971/04/13, 50 y.o.   MRN: [id number]  Pt now has an appointed guardian ad litem- [name] [phone]. SW spoke with [name] to ensure there was nothing needed. Reports nothing needed at this time; and interim guardian hearing is this afternoon to confirm if wife will get interim guardianship until full guardianship hearing on 07/03/2020.   [name], MSW, [place] Office: [phone] Cell: [phone] Fax: [phone]
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
## 2020-06-12 NOTE — Progress Notes (Signed)
Pt slept through the night. No episodes of agitation.
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
## 2020-06-12 NOTE — Progress Notes (Addendum)
Physical Therapy Session Note  Patient Details  Name: Adam Bates MRN: [id number] Date of Birth: Jun 24, 1970  Today's Date: 06/12/2020 PT Individual Time: 1425-1540 PT Individual Time Calculation (min): 75 min   Short Term Goals: Week 5:  PT Short Term Goal 1 (Week 5): Patient will perform dynamic standing balance activity >5 min with CGA. PT Short Term Goal 2 (Week 5): Patient will tolerate >75% of sessions outside of his room with min A for attention. PT Short Term Goal 3 (Week 5): Patient will perform 6 Minute Walk Test with min cues for attention to task using LRAD and supervision.  Skilled Therapeutic Interventions/Progress Updates:     Patient in recliner in the room upon PT arrival. Patient alert and agreeable to PT session. Patient reported mild-moderate R knee pain during session, RN made aware and patient declined pain medicine at this time. PT provided repositioning, rest breaks, and distraction as pain interventions throughout session.   Therapeutic Activity: Bed Mobility: Patient performed supine to/from prone and supine to/from sit with supervision-mod I for increased time on a mat table. Provided verbal cues and demonstration for prone lying, as patient continued to lay on his L side with only cues. Transfers: Patient performed sit to/from stand x5 with supervision with and without RW. Provided verbal cues for equal foot placement and weight bearing for improved R knee flexion with functional activity. Patient demonstrates no LOB without AD.  Gait Training:  Patient ambulated >200 feet and ~50 feet using RW with supervision. Ambulated with decreased gait speed, decreased step height R>L and decreased step length L>R, mild forward trunk flexion, and resting on upper extremities during gait. Provided verbal cues for increased step length on L to promote R weight shift, increased R knee flexion on R in swing, and erect posture with light touch on RW to improved balance with gait.  Patient encouraged to path find around the nurses station and from the Day room to his room, however, patient asked for directions at every turn and would ask the therapist to tell him rather than problem solve each time.   Manual Therapy: -soft tissue mobilization to R quads for reduced muscle tension and trigger point release -grade 3/4 A/P joint mobilizations at R proximal tibia with femur stabilized for increased knee flexion ROM 3x45 sec bouts -supine R heel slides with manual over pressure at end range x5 sec -supine R hip/knee flexion stretch with hip flexed at 90 deg to allow gravity assist for knee flexion 2x30 sec -prone R hamstring curl with over pressure at end range x5 sec -prone R knee flexion stretch with over pressure 2x1 min -seated assisted R knee flexion with prolonged hold and manual progression to patient tolerance, audible scar tissue release during without pain per patient report  R knee flexion goniometric measurements: 94 deg in supine, 92 deg prone, and 94 deg in sitting  Patient engaged in appropriate questions about when he will d/c and return to his family. Educated patient on plans for family education with his kids outside during therapy next week. Will schedule with his wife on Monday during family meeting. Patient emotional about missing his family and stated, "I just hate what this has put on my wife." Provided emotional support and therapeutic listening. Provided positive reinforcement for expressing his concerns and feelings to therapist. Noted improved, but intermittent, language of confusion and word finding. Patient acknowledged inappropriate comments x2, stating "did I say that out loud," and "you know I didn't mean that." Patient  demonstrates improved awareness and attention with therapeutic activities and conversation throughout session.   Patient in recliner in the room at end of session with breaks locked, chair alarm set, and all needs within reach.     Therapy Documentation Precautions:  Precautions Precautions: Fall Precaution Comments: no longer has PEG Other Brace: prevalon boot  for wound on pt's R heel Restrictions Weight Bearing Restrictions: No RLE Weight Bearing: Weight bearing as tolerated Other Position/Activity Restrictions: may progress to WBAT per ortho 05/21/20   Therapy/Group: Individual Therapy  Adam Bates PT, DPT  06/12/2020, 6:30 PM

## 2020-06-12 NOTE — Progress Notes (Signed)
Occupational Therapy Session Note  Patient Details  Name: Adam Bates MRN: [id number] Date of Birth: [birth year]  Today's Date: 06/12/2020 OT Individual Time: 5956-3875 OT Individual Time Calculation (min): 63 min  and Today's Date: 06/12/2020 OT Missed Time: 12 Minutes Missed Time Reason: Other (comment) (scheduling)   Short Term Goals: Week 1:  OT Short Term Goal 1 (Week 1): Pt will complete LB dressing with mod assist. OT Short Term Goal 1 - Progress (Week 1): Met OT Short Term Goal 2 (Week 1): Pt will follow 1-step commands during a self-care task with 50% accuracy. OT Short Term Goal 2 - Progress (Week 1): Met OT Short Term Goal 3 (Week 1): Pt will complete functional transfers with mod assist OT Short Term Goal 3 - Progress (Week 1): Met OT Short Term Goal 4 (Week 1): Pt will complete bathing with mod assist. OT Short Term Goal 4 - Progress (Week 1): Progressing toward goal  Skilled Therapeutic Interventions/Progress Updates:    Pt missed initial 12 min d/t MD asking this clinician question. Pt pleasantly confused throughtou session and is very cooperative and appreciative to OT. Pt completes all mobility with RW and S overall to/from all tx destinations. Pt sits in tx gym with dominoes in front of pt. Pt able to follow pattern to connect dominoes with all options placed on R for R attention 2x with no errors. Pt asked colors and numbers with errors in expression 80% of the time. Pt sits at [facility] attempting trail making B test again however pt with increased errors (3). Of note pt using hand to visually scan field L to R but does not track hand to all the way to R of screen. Pt seems intermittently aware of R visual deficits but compensations limited by R inattention. Pt completes sample bell cancelation test scanning L to midline, top to bottom requiring VC for locating bells on far R of screen. Pt completes sorting card task calling all suits hearts, except hearts were called  "peaches." pt accurate with 75% of sorting. Exited session with pt seated in bed, exit alarm on and call light in reach   Therapy Documentation Precautions:  Precautions Precautions: Fall Precaution Comments: no longer has PEG Other Brace: prevalon boot  for wound on pt's R heel Restrictions Weight Bearing Restrictions: No RLE Weight Bearing: Weight bearing as tolerated Other Position/Activity Restrictions: may progress to WBAT per ortho 05/21/20 General:   Vital Signs: Therapy Vitals Temp: 97.9 F (36.6 C) Temp Source: Oral Pulse Rate: 83 Resp: 16 BP: 99/72 Patient Position (if appropriate): Lying Oxygen Therapy SpO2: 99 % O2 Device: Room Air Pain: Pain Assessment Pain Scale: Faces Faces Pain Scale: Hurts whole lot Pain Type: Acute pain Pain Location: Heel Pain Orientation: Right Pain Intervention(s): Medication (See eMAR) ADL:   Vision   Perception    Praxis   Exercises:   Other Treatments:     Therapy/Group: Individual Therapy  Tonny Branch 06/12/2020, 6:49 AM

## 2020-06-13 DIAGNOSIS — S069X5S Unspecified intracranial injury with loss of consciousness greater than 24 hours with return to pre-existing conscious level, sequela: Secondary | ICD-10-CM

## 2020-06-13 MED ORDER — METHYLPHENIDATE HCL 5 MG PO TABS
20.0000 mg | ORAL_TABLET | Freq: Two times a day (BID) | ORAL | Status: DC
  Administered 2020-06-13 – 2020-06-24 (×22): 20 mg via ORAL
  Filled 2020-06-13 (×23): qty 4

## 2020-06-13 MED ORDER — OXYCODONE HCL 5 MG PO TABS
5.0000 mg | ORAL_TABLET | Freq: Four times a day (QID) | ORAL | Status: DC | PRN
  Administered 2020-06-13 – 2020-06-17 (×6): 10 mg via ORAL
  Administered 2020-06-17: 5 mg via ORAL
  Filled 2020-06-13 (×3): qty 2
  Filled 2020-06-13: qty 1
  Filled 2020-06-13 (×5): qty 2

## 2020-06-13 MED ORDER — ACETAMINOPHEN 325 MG PO TABS
650.0000 mg | ORAL_TABLET | Freq: Four times a day (QID) | ORAL | Status: DC | PRN
  Administered 2020-06-13 – 2020-06-23 (×9): 650 mg via ORAL
  Filled 2020-06-13 (×9): qty 2
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
## 2020-06-13 NOTE — Progress Notes (Addendum)
Physical Therapy Session Note  Patient Details  Name: Adam Bates MRN: [id number] Date of Birth: 1970/12/06  Today's Date: 06/13/2020 PT Individual Time: 1751-0258 PT Individual Time Calculation (min): 79 min   Short Term Goals: Week 5:  PT Short Term Goal 1 (Week 5): Patient will perform dynamic standing balance activity >5 min with CGA. PT Short Term Goal 2 (Week 5): Patient will tolerate >75% of sessions outside of his room with min A for attention. PT Short Term Goal 3 (Week 5): Patient will perform 6 Minute Walk Test with min cues for attention to task using LRAD and supervision.  Skilled Therapeutic Interventions/Progress Updates:    Patient in recliner in room doodling on his notebook.  Patient agreeable to PT.  Sit to stand to RW S.  Patient ambulated to ortho gym x 170' with S antalgic on R.  Patient performed car transfer to simulated sedan height car with S noting having to scoot over to get R leg into vehicle.  Discussed needs to get more knee ROM.    Patient on Nu Step for 6 minutes for UE/LE with seat forward as much as pt tolerated to work on R knee ROM.  Patient supine on mat performed assisted heel slides with AROM R knee flexion 90 & AAROM 96.  Performed contract relax for stretch as tolerated on R knee x 3 reps.  Measured AROM R knee flexion 93, AAROM 99 after C-R stretching.  Patient to prone on mat to perform hamstring curls x 10 each leg, then hip extension x 10 AAROM on R, supine SLR on R with A,  bridging with shoes on x 10 and 5 sec hold, L Sidelying R clamshell hip abduction x 10.    Patient reported 7/10 pain in knee after activity, RN informed after session and pt reported improvement after seated rest.  Seated for bouncing first weighted ball, then unweighted ball on rebounder with pt not able to catch weighted ball well due to visual issues per his report.  Patient performed standing with R foot on airex pad for L weight bearing and balance ball toss to rebounder  without UE support with S.  Patient negotiated 4 steps with bilateral rails and CGA cues for sequence.   Seated to recall current location, problems working on, plans for home, etc.  Patient with difficulty vocalizing at times with word finding difficulty and understanding question asked, though makes every attempt to understand and at times perseverates on apologizing.  Patient left up in recliner with seat alarm active and belt around waist.  RN aware pain medication may be needed.   Therapy Documentation Precautions:  Precautions Precautions: Fall Precaution Comments: no longer has PEG Other Brace: prevalon boot  for wound on pt's R heel Restrictions Weight Bearing Restrictions: No RLE Weight Bearing: Weight bearing as tolerated Other Position/Activity Restrictions: may progress to WBAT per ortho 05/21/20 Pain: Pain Assessment Pain Score: 7  Pain Type: Acute pain Pain Location: Leg Pain Orientation: Right Pain Onset: With Activity Pain Intervention(s): RN made aware;Rest;Repositioned   Therapy/Group: Individual Therapy  Reginia Naas  Magda Kiel, PT 06/13/2020, 4:47 PM
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
## 2020-06-13 NOTE — Progress Notes (Signed)
Patient ID: Adam Bates, male   DOB: Jun 19, 1970, 50 y.o.   MRN: [id number]   Covering for primary SW, [place]  Patient Disability paperwork completed, faxed and left in patient room. Spouse contacted and aware.  [place], [place]

## 2020-06-13 NOTE — Progress Notes (Signed)
Speech Language Pathology Weekly Progress and Session Note  Patient Details  Name: Adam Bates MRN: [id number] Date of Birth: [birth year]  Beginning of progress report period: June 06, 2020 End of progress report period: June 13, 2020  Today's Date: 06/13/2020 SLP Individual Time: 0725-0810 SLP Individual Time Calculation (min): 45 min  Short Term Goals: Week 5: SLP Short Term Goal 1 (Week 5): Patient will consume trials of Dys. 3 textures over 2 sessions with efficient mastication and complete oral clerance and without overt s/s of aspiration with supervision verbal cues prior to upgrade. SLP Short Term Goal 1 - Progress (Week 5): Met SLP Short Term Goal 2 (Week 5): Patient will consume current diet of Dys.2  textures with thin liquids with minimal overt s/s of aspiration and overall Mod I for use of swallowing compensatory strategies. SLP Short Term Goal 2 - Progress (Week 5): Met SLP Short Term Goal 3 (Week 5): Patient will demonstrate sustained attention to functional tasks for 15 minutes with Mod verbal cues for redirection. SLP Short Term Goal 3 - Progress (Week 5): Met SLP Short Term Goal 4 (Week 5): Patient will utilize external aids for orientation to time, place and situation with Mod verbal and visual cues. SLP Short Term Goal 4 - Progress (Week 5): Met SLP Short Term Goal 5 (Week 5): Patient will demonstrate functional problem solving for basic and familiar tasks with Mod verbal cues. SLP Short Term Goal 5 - Progress (Week 5): Met    New Short Term Goals: Week 6: SLP Short Term Goal 1 (Week 6): Patient will utilize external aids for orientation to time, place and situation with Min verbal and visual cues. SLP Short Term Goal 2 (Week 6): Patient will demonstrate sustained attention to functional tasks for 30 minutes with Mod verbal cues for redirection. SLP Short Term Goal 3 (Week 6): Patient will demonstrate functional problem solving for basic and familiar tasks with  Min verbal cues.  Weekly Progress Updates: Patient continues to make excellent gains and has met 5 of 5 STGs this reporting period.  Currently, patient is consuming regular textures with thin liquids with minimal overt s/s of aspiration and requires overall supervision level verbal cues for use of compensatory strategies. Patient demonstrates behaviors consistent with a Rancho Level VI and requires overall Mod A verbal cues to complete functional and familiar tasks safely in regards to orientation with use of aids, problem solving, recall and awareness. Patient continues to demonstrate improved appropriateness with social interactions with a decrease in agitation and language of confusion. Patient and family education ongoing. Patient would benefit from continued skilled SLP intervention to maximize his cognitive functioning and overall functional independence prior to discharge.      Intensity: Minumum of 1-2 x/day, 30 to 90 minutes Frequency: 3 to 5 out of 7 days Duration/Length of Stay: 06/24/20 Treatment/Interventions: Cognitive remediation/compensation;Environmental controls;Cueing hierarchy;Functional tasks;Therapeutic Activities;Therapeutic Exercise;Dysphagia/aspiration precaution training;Medication managment;Patient/family education   Daily Session  Skilled Therapeutic Interventions:  Skilled treatment session focused on cognitive and dysphagia goals. SLP facilitated session by providing skilled observation with breakfast meal of regular textures with thin liquids.  Patient consumed meal without overt s/s of aspiration however required supervision level verbal cues for selective attention to task due to verbosity. Therefore, recommend patient change to intermittent supervision with meals. SLP also inititated the use of a memory notebook to maximize recall and carryover of daily information. Patient recorded information with Min verbal cues needed for organization and for use of external aids  for recall of date. Patient left upright in bed with alarm on and all needs within reach. Continue with current plan of car.e      Pain No/Denies Pain   Therapy/Group: Individual Therapy  Carmon Brigandi 06/13/2020, 6:43 AM

## 2020-06-13 NOTE — Progress Notes (Signed)
Occupational Therapy Session Note  Patient Details  Name: Adam Bates MRN: [id number] Date of Birth: 10-Sep-1970  Today's Date: 06/13/2020 OT Individual Time: 1000-1057 OT Individual Time Calculation (min): 57 min    Short Term Goals: Week 4:  OT Short Term Goal 1 (Week 4): Pt will use external cues to increase orientation to AOx2 OT Short Term Goal 1 - Progress (Week 4): Not met OT Short Term Goal 2 (Week 4): Pt will complete bathing at shower level with no more than min A OT Short Term Goal 2 - Progress (Week 4): Met OT Short Term Goal 3 (Week 4): Pt will don pants with CGA OT Short Term Goal 3 - Progress (Week 4): Met OT Short Term Goal 4 (Week 4): Pt will complete grooming task with no more than 1 cue to initiate OT Short Term Goal 4 - Progress (Week 4): Met  Skilled Therapeutic Interventions/Progress Updates:    1:1. Pt received in bed agreeable to OT. Pt completes bathing at shower level with VC for RW management but demo better safety awareness sitting to doff clothing. Pt completes bathing sit to stand with VC for recalling to rinse shampoo off hair and A to wash B feet. Pt dresses with S overall and clothing placed on R. Pt grooms at sink with set up. Pt completes all ambulation to/from room with S overall using RW with VC for management turning to sit. Pt completes seated ball toss recalling 3 types of passes in order and able to count and keep track for cognitive task. Pt completes standing ball toss to rebounder naming animals with MAX A overall to verbalize animal name. Pt able to state better when noise animal makes is said or animal cted out. Exited session with pt seated in bed, exit alarm on and call light in reach   Therapy Documentation Precautions:  Precautions Precautions: Fall Precaution Comments: no longer has PEG Other Brace: prevalon boot  for wound on pt's R heel Restrictions Weight Bearing Restrictions: No RLE Weight Bearing: Weight bearing as  tolerated Other Position/Activity Restrictions: may progress to WBAT per ortho 05/21/20 General:   Vital Signs:  Pain: Pain Assessment Pain Scale: 0-10 Pain Score: 0-No pain ADL:   Vision   Perception    Praxis   Exercises:   Other Treatments:     Therapy/Group: Individual Therapy  Tonny Branch 06/13/2020, 10:57 AM

## 2020-06-14 NOTE — Progress Notes (Signed)
Occupational Therapy Session Note  Patient Details  Name: Adam Bates MRN: [id number] Date of Birth: 31-Dec-1970  Today's Date: 06/14/2020 OT Individual Time: 1020-1027 27 MIN  Short Term Goals: Week 1:  OT Short Term Goal 1 (Week 1): Pt will complete LB dressing with mod assist. OT Short Term Goal 1 - Progress (Week 1): Met OT Short Term Goal 2 (Week 1): Pt will follow 1-step commands during a self-care task with 50% accuracy. OT Short Term Goal 2 - Progress (Week 1): Met OT Short Term Goal 3 (Week 1): Pt will complete functional transfers with mod assist OT Short Term Goal 3 - Progress (Week 1): Met OT Short Term Goal 4 (Week 1): Pt will complete bathing with mod assist. OT Short Term Goal 4 - Progress (Week 1): Progressing toward goal  Skilled Therapeutic Interventions/Progress Updates:    1:1. Pt received in recliner agreeable to OT. Pt unable to recall OT name at beginning of session or end of session. Pt completes standing boxing in 1 min intervals switching R v L foot forward in staggered stance. Pt able to verbally repeat pattern of punches (pt familiar as Wellsite geologist), but unable to execute without demo. Pt sits to match cards with cards placed on R. Pt with improved number identification and verbalizeation but still struggles with suits. No errors in matching this date which pt states, "is better than last time I did it." Exited session with pt seated in bed, exit alarm on and call light in reach   Therapy Documentation Precautions:  Precautions Precautions: Fall Precaution Comments: no longer has PEG Other Brace: prevalon boot  for wound on pt's R heel Restrictions Weight Bearing Restrictions: Yes RLE Weight Bearing: Weight bearing as tolerated Other Position/Activity Restrictions: may progress to WBAT per ortho 05/21/20 General:   Vital Signs: Therapy Vitals Pulse Rate: 79 BP: 113/81 Pain: Pain Assessment Pain Scale: 0-10 Pain Score: 0-No pain Faces Pain  Scale: No hurt ADL:   Vision   Perception    Praxis   Exercises:   Other Treatments:     Therapy/Group: Individual Therapy  Tonny Branch 06/14/2020, 10:29 AM

## 2020-06-14 NOTE — Progress Notes (Signed)
Patient feeling very anxious and tearful.  Patient getting up and moving around the room which is causing him pain in his right leg.  RN asked patient if he wanted something for pain and patient stated, "yes".  RN gave pain medicine and anxiety med.

## 2020-06-14 NOTE — Progress Notes (Signed)
Speech Language Pathology Daily Session Note  Patient Details  Name: Adam Bates MRN: [id number] Date of Birth: 1970-12-08  Today's Date: 06/14/2020 SLP Individual Time: 1100-1145 SLP Individual Time Calculation (min): 45 min  Short Term Goals: Week 6: SLP Short Term Goal 1 (Week 6): Patient will utilize external aids for orientation to time, place and situation with Min verbal and visual cues. SLP Short Term Goal 2 (Week 6): Patient will demonstrate sustained attention to functional tasks for 30 minutes with Mod verbal cues for redirection. SLP Short Term Goal 3 (Week 6): Patient will demonstrate functional problem solving for basic and familiar tasks with Min verbal cues.  Skilled Therapeutic Interventions: Pt seen for skilled ST focusing on cognitive goals. SLP and pt reviewing memory notebook and pt recording information with mod cues for thought organization, problem solving and spelling. Pt completing simple 3-step sequencing task with picture cards with 60% accuracy independent, min cues increased to 90% accuracy. Pt demonstrating emergent awareness of functional errors during task with episodes of independent self-correction. Pt left upright in chair with alarm set and all needs within reach. Cont ST POC.   Pain Pain Assessment Pain Scale: 0-10 Pain Score: 0-No pain Faces Pain Scale: No hurt  Therapy/Group: Individual Therapy  Dewaine Conger 06/14/2020, 11:37 AM
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
## 2020-06-15 NOTE — Progress Notes (Signed)
Physical Therapy Session Note  Patient Details  Name: Adam Bates MRN: [id number] Date of Birth: 1971/04/29  Today's Date: 06/15/2020 PT Individual Time: 6073-7106 PT Individual Time Calculation (min): 45 min   Short Term Goals: Week 5:  PT Short Term Goal 1 (Week 5): Patient will perform dynamic standing balance activity >5 min with CGA. PT Short Term Goal 2 (Week 5): Patient will tolerate >75% of sessions outside of his room with min A for attention. PT Short Term Goal 3 (Week 5): Patient will perform 6 Minute Walk Test with min cues for attention to task using LRAD and supervision.  Skilled Therapeutic Interventions/Progress Updates:     Patient in recliner drawing upon PT arrival. Patient alert and agreeable to PT session. Patient reported mild R lower extremity pain initially that progressing to significant R lower extremity pain during session, RN made aware. PT provided repositioning, rest breaks, and distraction as pain interventions throughout session.   Therapeutic Activity: Transfers: Patient performed sit to/from stand x2 with supervision using RW and x3 with CGA without RW with cues focused on equal weight bearing and increased R knee flexion with a functional activity.   Gait Training:  Patient ambulated >100 feet to/from the therapy gym using RW with supervision. Ambulated with mild decreased weight shift to the R and decreased R knee flexion in swing. Provided verbal cues for increased L step length to promote increased R weight shift and increased R hamstring activation for increased R knee flexion in swing.  In the therapy gym the patient expressed concerns about his increased R lower extremity pain with mobility, also noted increased frustration tolerance and external distraction in busier environment. Patient agreeable to return to the room for reduced external stimulation and to ask for pain medication. Once returned to the room the patient expressed that he was not  receiving his medications at the same time and that he feels different when there is increased time between his medications. Patient unable to identify which medications he was talking about, but reported symptoms of increased pain and frustration and decreased attention with drawing and speaking the therapist in the gym. Upon chart review patient had not received Oxycodone >24 hours and had not received his afternoon dose of Ritalin. Discussed with RN who stated he has not had reports of pain and asked not to be interrupted with a visitor earlier when Ritalin was due. RN to provide medications following session. Educated patient on calling for medications using call bell when pain increases and discussing with nursing about concerns about missed medications and taking medications when offerred. Patient continued to be externally distracted and verbose about medication administration and perseverating on going home. Eventually patient in tears due to emotional lability and decreased frustration tolerance with reduced attention at this time. PT provided emotional support and therapeutic listening with improvement in patient's emotional state after. Patient demonstrating improved awareness of deficits, recall, and orientation this session.   Patient in recliner at end of session with breaks locked, chair alarm set, and all needs within reach.    Therapy Documentation Precautions:  Precautions Precautions: Fall Precaution Comments: no longer has PEG Other Brace: prevalon boot  for wound on pt's R heel Restrictions Weight Bearing Restrictions: No RLE Weight Bearing: Weight bearing as tolerated Other Position/Activity Restrictions: may progress to WBAT per ortho 05/21/20   Therapy/Group: Individual Therapy  Aquila Menzie L Averi Kilty PT, DPT  06/15/2020, 5:30 PM

## 2020-06-15 NOTE — Progress Notes (Signed)
Occupational Therapy Session Note  Patient Details  Name: Adam Bates MRN: [id number] Date of Birth: 1970/05/17  Today's Date: 06/15/2020 OT Individual Time: 0935-1030 OT Individual Time Calculation (min): 55 min    Short Term Goals: Week 5:  OT Short Term Goal 1 (Week 5): Pt will use external cues to increase orientation to x2 OT Short Term Goal 2 (Week 5): Pt will increase attention to task as demonstrated by requiring no more than min cueing during bathing OT Short Term Goal 3 (Week 5): Pt will ask for assist when needed during ADLs with no more than min cueing to demo improved self-awareness  Skilled Therapeutic Interventions/Progress Updates:    Pt received sitting in recliner with no c/o pain. Pt agreeable to take shower. Pt completed ambulatory transfer into the bathroom with RW with supervision. He did not require cueing for seated doffing of clothes. He transferred into shower and required only min cueing for thoroughness of bathing. Pt attempted to don underwear in standing and required min A to thread over LLE and cueing for safety. Pt donned pants sit <> stand with supervision. Shirt donned supervision as well. Pt able to reach R foot today to don socks and shoes. He was encouraged to name each article of clothing/ADL item as he completed routine, with about 70% accuracy. He often would state several nonsense words or incorrect words before eventually naming item correctly. Pt completed 150 ft of functional mobility to the therapy gym with RW, supervision. He completed sequencing and dynamic balance activity- picking up numbered discs on the floor with CGA overall and min cueing for spontaneous color and number naming. He then completed core stabilization exercises in quadruped. Attempted transition into modified child's pose to increase R knee flexion ,pt able to tolerate to 90 degrees of flexion and then reported pain. He took seated rest break and then returned to his room. He was  left sitting up with all needs met.   Therapy Documentation Precautions:  Precautions Precautions: Fall Precaution Comments: no longer has PEG Other Brace: prevalon boot  for wound on pt's R heel Restrictions Weight Bearing Restrictions: No RLE Weight Bearing: Weight bearing as tolerated Other Position/Activity Restrictions: may progress to WBAT per ortho 05/21/20  Therapy/Group: Individual Therapy  Curtis Sites 06/15/2020, 6:40 AM
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
## 2020-06-16 NOTE — Progress Notes (Signed)
Speech Language Pathology Daily Session Note  Patient Details  Name: SON BARKAN MRN: [id number] Date of Birth: 08/07/1970  Today's Date: 06/16/2020 SLP Individual Time: 1031-1100 SLP Individual Time Calculation (min): 29 min  Short Term Goals: Week 6: SLP Short Term Goal 1 (Week 6): Patient will utilize external aids for orientation to time, place and situation with Min verbal and visual cues. SLP Short Term Goal 2 (Week 6): Patient will demonstrate sustained attention to functional tasks for 30 minutes with Mod verbal cues for redirection. SLP Short Term Goal 3 (Week 6): Patient will demonstrate functional problem solving for basic and familiar tasks with Min verbal cues.  Skilled Therapeutic Interventions: Pt seen for skilled ST with focus on cognitive goals, pt sitting in recliner, pleasant and agreeable to tx. SLP facilitating orientation task by providing min A verbal cues to increase awareness of temporal orientation concepts and timeline for remaining stay at CIR. Pt continues to demonstrate decreased insight into current cognitive deficits, stating he doesn't understand why he can't go home today independently. Pt provided education on rationale behind remaining in CIR at this time, pt easily redirected. 4-step picture sequencing task with 100% mod I. 6-step picture sequencing task with 50% accuracy independent, min cues increased to 100%. Pt's emergent awareness of functional errors during structured task continues to increase. Pt left in chair with alarm set and all needs within reach. Cont ST POC.   Pain Pain Assessment Pain Scale: 0-10 Pain Score: 0-No pain Faces Pain Scale: Hurts a little bit Pain Type: Acute pain Pain Location: Leg Pain Orientation: Right Pain Descriptors / Indicators: Aching;Discomfort Pain Frequency: Intermittent Patients Stated Pain Goal: 2 Pain Intervention(s): Medication (See eMAR)  Therapy/Group: Individual Therapy  Dewaine Conger 06/16/2020,  12:10 PM

## 2020-06-16 NOTE — Consult Note (Signed)
WOC Nurse Consult Note: Patient receiving care in [facility] [phone]. Reason for Consult: "black eschar on top of scalp wound- would he benefit from debridement?" Wound type: The patient had gotten into the shower and washed his head and the eschar had washed off. Pressure Injury POA: No Measurement: 2.3 cm x 1.3  Wound bed: pink Drainage (amount, consistency, odor) no drainage, no s/s of infection Periwound: intact Dressing procedure/placement/frequency:  Assess the wound on the posterior, upper head each day.  If it begins to show s/s of infection or eschar formation, notify the MD.  Otherwise, keep it covered with a foam dressing.  Monitor the wound area(s) for worsening of condition such as: Signs/symptoms of infection,  Increase in size,  Development of or worsening of odor, Development of pain, or increased pain at the affected locations.  Notify the medical team if any of these develop.  Thank you for the consult.  Discussed plan of care with the patient and bedside nurse.  [place] nurse will not follow at this time.  Please re-consult the [facility] team if needed.  Val Riles, RN, MSN, CWOCN, CNS-BC, pager [phone]

## 2020-06-16 NOTE — Progress Notes (Signed)
Patient had wound on back of head.  Patient took a shower this morning with therapy.  Patient washed his hair and the scab came off.  Therapist informed RN.  RN along with therapist placed a foam dressing on wound.  Wound looks beefy red and is healing.  [place] nurse informed when she came to assess.  Patient's spouse at bedside this afternoon and RN informed her of what happened today.

## 2020-06-16 NOTE — Progress Notes (Signed)
Occupational Therapy Session Note  Patient Details  Name: Adam Bates MRN: [id number] Date of Birth: Sep 06, 1970  Today's Date: 06/16/2020 OT Individual Time: 1110-1205 OT Individual Time Calculation (min): 55 min    Short Term Goals: Week 5:  OT Short Term Goal 1 (Week 5): Pt will use external cues to increase orientation to x2 OT Short Term Goal 2 (Week 5): Pt will increase attention to task as demonstrated by requiring no more than min cueing during bathing OT Short Term Goal 3 (Week 5): Pt will ask for assist when needed during ADLs with no more than min cueing to demo improved self-awareness  Skilled Therapeutic Interventions/Progress Updates:    Pt received sitting in recliner requesting to use the bathroom. He completed ambulatory transfer into the bathroom and all toileting tasks with supervision overall. Pt with much improved planning/sequencing, as he asked OT to get a new pair of underwear so he could don them in the bathroom after shower and then don remainder of clothes in the room. He completed transfer into the shower with CGA. Min cueing for thoroughness with bathing with supervision overall. Despite language of confusion pt was able to get across remembering that someone had looked at his scalp scab yesterday. RN entered room and OT assisted with cutting hair to don bandage. Pt dressed following shower with min cueing for donning pants seated. Pt completed ambulatory transfer to the therapy gym with RW with supervision. Along the way encouraged functional communication/item naming out of the vending machine, pt required max cueing to name 8 items out of the vending machine, becoming distracted by other words on the packaging. Pt completed playing card matching activity with mod cueing for number and case identification. Pt returned to his room and was left sitting up with all needs met. Chair alarm set.   Therapy Documentation Precautions:  Precautions Precautions:  Fall Precaution Comments: no longer has PEG Other Brace: prevalon boot  for wound on pt's R heel Restrictions Weight Bearing Restrictions: No RLE Weight Bearing: Weight bearing as tolerated Other Position/Activity Restrictions: may progress to WBAT per ortho 05/21/20  Therapy/Group: Individual Therapy  Curtis Sites 06/16/2020, 6:22 AM
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
## 2020-06-16 NOTE — Progress Notes (Signed)
Pt states he slept well throughout the night. Pt had no incontinent episodes. Pt denies pain, and resting in bed at this time.

## 2020-06-16 NOTE — Progress Notes (Signed)
Patient ID: Adam Bates, male   DOB: 23-Mar-1971, 50 y.o.   MRN: [id number]  SW followed up with family meeting scheduled today for 9am. Pt reported she spoke with attending on Friday to reschedule as she would like whole medical team to be present. Wife is aware SW will follow-up with attending about a scheduled date/time to meet.   [name], MSW, [place] Office: [phone] Cell: [phone] Fax: [phone]

## 2020-06-17 MED ORDER — ENSURE MAX PROTEIN PO LIQD
11.0000 [oz_av] | Freq: Two times a day (BID) | ORAL | Status: DC
  Administered 2020-06-17 – 2020-06-24 (×13): 11 [oz_av] via ORAL
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
## 2020-06-17 NOTE — Patient Care Conference (Signed)
Inpatient RehabilitationTeam Conference and Plan of Care Update Date: 06/17/2020   Time: 10:22 AM    Patient Name: Adam Bates      Medical Record Number: [id number]  Date of Birth: [birth year] Sex: Male         Room/Bed: 4W15C/4W15C-01 Payor Info: Payor: CIG[employer] / Plan: CIGNA MANAGED / Product Type: *No Product type* /    Admit Date/Time:  05/09/2020  6:43 PM  Primary Diagnosis:  Traumatic brain injury [facility])  Hospital Problems: Principal Problem:   Traumatic brain injury [facility]) Active Problems:   Dysphagia, oropharyngeal phase   Fracture   Sleep disturbance   Acute lower UTI   Acute blood loss anemia   Agitation    Expected Discharge Date: Expected Discharge Date: 06/24/20  Team Members Present: Physician leading conference: Dr. Alger Simons Care Coodinator Present: [name], LCSWA;Jemmie Rhinehart Creig Hines, RN, BSN, CRRN Nurse Present: Annita Brod, LPN PT Present: Apolinar Junes, PT OT Present: Laverle Hobby, OT SLP Present: Weston Anna, SLP PPS Coordinator present : Ileana Ladd, Burna Mortimer, SLP     Current Status/Progress Goal Weekly Team Focus  Bowel/Bladder   pt is continent of b/b, LBM 2/14  Encourage use of toilet every 2 hours while awake.  Pt will remain continent b/b   Swallow/Nutrition/ Hydration   regular textures with thin liquids, intermittent supervision  Supervision with least restrictive diet  tolerance of current diet, use of swallow strategies   ADL's   Great improvement this week, supervision with ADLs with min cueing for thoroughness/some sequencing, (S) transfers, still requiring cueing for safety, sequencing, motor planning  supervision  family education, d/c planning, motor planning, transfers, ADLs, cognition   Mobility   Supervision overall, gait >400 ft with RW, continued deficits in safety awarenss, path finding, dual task, and R LE ROM  Supervision overall, min A for attention  Family education, functional mobility, gait and  stair training, d/c planning, dual task training, path finding, safety awareness, attention, R LE ROM   Communication             Safety/Cognition/ Behavioral Observations  [place] Level VI-Mod A  Mod A  problem solving, recall with use of strategies, awareness   Pain   pt c/o lower back pain, PRN oxy and tylenol effective.  To keep pain at least a 3/10 pain level.  Assess pain qshift.   Skin   R heel w/ unstageable w/foam.  No new breakdown or irritation to heel.  Assess Qshift     Discharge Planning:  Pt to d/c to home with his wife and their four teenage children. Additional support- possibly his brother. Pt would benefit from staying at hospital until appropriate for outpatient therapies at time of d/c due to insurance challenges with obtaining [place] and to reduce caregiver burden.   Team Discussion: Gaining more insight, awareness. Appropriately about medications he is on this morning. Scab came off back of head, WOC consulted. Continent B/B, A&O x4 today, complains of lower back pain, leg pain, 8/10 Tylenol and Oxy IR given. Unstageable heel and back of head only notable skin issues.  Patient on target to meet rehab goals: Making great progress. Supervision with ADL's, on track for discharge. Supervision for gait and transfers. 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 Regular diet, thin liquids, good gains with language, awareness, and is aware when he is not communicating things correctly.  *See Care Plan and progress notes for long and  short-term goals.   Revisions to Treatment Plan:  Family education session with children later this week.  Teaching Needs: Continue family education, medication management, skin/wound care, gait training, transfer training, safety awareness.  Current Barriers to Discharge: Inaccessible home environment, Decreased caregiver support, Home enviroment access/layout, Wound care, Lack of/limited family  support, Weight bearing restrictions, Medication compliance and Behavior  Possible Resolutions to Barriers: Continue current medications, pain management, provide emotional support to patient and family.     Medical Summary Current Status: gradual improvement in cognition, awareness. still distracted but less restless. RLE pain controlled with meds  Barriers to Discharge: Medical stability   Possible Resolutions to [employer] Focus: ongoing med mgt, family education, pain control, wound rx   Continued Need for Acute Rehabilitation Level of Care: The patient requires daily medical management by a physician with specialized training in physical medicine and rehabilitation for the following reasons: Direction of a multidisciplinary physical rehabilitation program to maximize functional independence : Yes Medical management of patient stability for increased activity during participation in an intensive rehabilitation regime.: Yes Analysis of laboratory values and/or radiology reports with any subsequent need for medication adjustment and/or medical intervention. : Yes   I attest that I was present, lead the team conference, and concur with the assessment and plan of the team.   Cristi Loron 06/17/2020, 2:07 PM
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
## 2020-06-17 NOTE — Progress Notes (Signed)
Patient ID: Adam Bates, male   DOB: Sep 23, 1970, 50 y.o.   MRN: [id number]  SW spoke with pt wife Adam Bates [phone]) to provide updates from team conference, scheduling family meeting, discharge recommendations: outpatient PT/OT/SLP and RW. Family meeting scheduled for Thursday 2/17 at [place] with pt wife and his brother Adam Bates. SW discussed outpatient therapy locations offered; wife to follow-up about which location she prefers [facility] on [address] or [facility] location. SW informed RW will be ordered for pt.  *SW received follow-up from pt wife preferred outpatient location is [facility]. SW ordered RW with [facility] via parachute. SW faxed outpatient referral to [facility] (p:[phone]/f:[phone]) requesting [facility] location.   [name], MSW, [place] Office: [phone] Cell: [phone] Fax: [phone]

## 2020-06-17 NOTE — Progress Notes (Signed)
Physical Therapy Session Note  Patient Details  Name: Adam Bates MRN: [id number] Date of Birth: 12-Nov-1970  Today's Date: 06/17/2020 PT Individual IWLN:[id number] and 1030-1120 PT Individual Time Calculation (min): 15 min and 50 min   Short Term Goals: Week 6:  PT Short Term Goal 1 (Week 6): STG=LTG due to ELOS.  Skilled Therapeutic Interventions/Progress Updates:     Session 1: Patient in recliner upon PT arrival. Patient alert and agreeable to PT session. Patient reported 8/10 R lower extremtiy pain during session, RN made aware. PT provided repositioning, rest breaks, and distraction as pain interventions throughout session.   Patient oriented to place and situation at beginning of session.  Noted increased R lower extremity edema and patient wincing with AAROM of R knee. Performed soft tissue mobilization to quads and calf and grade 1/2 A/P glided to tibia with R femur stabilized for pain management. Patient appropriately asked for pain medicine, stating "I don't know what they gave me earlier, but it wasn't enough." RN made aware. Patient then appropriately asked if therapist could come back after he received pain medicine. Provided positive reinforcement for patient advocating for himself and communicating his needs.   Patient in recliner in the room at end of session with breaks locked, chair alarm set, and all needs within reach. Patient missed 15 min of skilled PT due to pain, RN made aware. Will attempt to make-up missed time as able.     Session 2: Patient in recliner in the room upon PT arrival. Patient alert and agreeable to PT session. Patient reported 3-4/10 R lower extremity pain during session, RN made aware. PT provided repositioning, rest breaks, and distraction as pain interventions throughout session.   Therapeutic Activity: Transfers: Patient performed sit to/from stand from bed, mat table, and standard arm chair with supervision using RW. Provided verbal cues for  safety awareness as patient tends to push the RW away when going to sit.  Gait Training:  Patient ambulated >100 feet and >300 feet using RW with close supervision for safety. Ambulated with step-through gait patter with mild antalgic gait on R, noted increased use of upper extremities in R stance to off-weight R leg. Focused gait training on dual task and path finding activities during session. Provided verbal cues for path finding to the therapy gym on first trial. Patient able to locate the gym with 1 cue, patient stated the direction he was going to turn before taking each turn. Provided cues for patient to count backwards by 2's from 10 then from 50, patient performed without error, then asked patient to count backwards by 3's from 27, patient counted forwards by 3s to 21 before losing attention to task due to busy hallway environment. Finally asked patient to provide as many animals as he could think of while ambulating the last 50 feet during second trial. Patient able to name dog, then preceded to name types of dogs, then the names of his dogs. Patient engaged in social interaction with staff and patient's during ambulation and appropriately stopped for other's to pass him. Patient actively looking R/L at turns without signs of R inattention with gait this session.  Patient ascended/descended 16 steps (4x4) using R rail x8 steps and L rail x8 steps with B upper extremity support on rail with CGA-close supervision. Performed step-to gait pattern leading with L while ascending and R while descending. Provided cues for technique and sequencing. Patient able to use teach back method every 4 steps to determine which foot to  lead with. Patient stated correct foot to lead with 6/8 trials.   Therapeutic Exercise: Patient performed the following exercises with verbal and tactile cues for proper technique. -R knee flexion stretch in sitting 2x2 min with manual over pressure, 98 degrees knee flexion with  goniometric measure -R LAQ with focus on performing through full ROM with controlled muscle activation x10  In sitting at end of session, asked patient to again name as many animals he could think of. Patient stated "dog" then was unable to think of another animal. PT then provided noises that animals make and patient able to name 4/5 animals. Educated patient on word finding deficits and strategies to assist with word finding.   Patient in recliner in the room at end of session with breaks locked, bed alarm set, and all needs within reach.    Therapy Documentation Precautions:  Precautions Precautions: Fall Precaution Comments: no longer has PEG Other Brace: prevalon boot  for wound on pt's R heel Restrictions Weight Bearing Restrictions: No RLE Weight Bearing: Weight bearing as tolerated Other Position/Activity Restrictions: may progress to WBAT per ortho 05/21/20 General: PT Amount of Missed Time (min): 15 Minutes PT Missed Treatment Reason: Pain   Therapy/Group: Individual Therapy  Quientin Jent L Ameliana Brashear PT, DPT  06/17/2020, 4:59 PM

## 2020-06-17 NOTE — Progress Notes (Signed)
Speech Language Pathology Daily Session Note  Patient Details  Name: Adam Bates MRN: [id number] Date of Birth: [birth year]  Today's Date: 06/17/2020 SLP Individual Time: 0815-0910 SLP Individual Time Calculation (min): 55 min  Short Term Goals: Week 6: SLP Short Term Goal 1 (Week 6): Patient will utilize external aids for orientation to time, place and situation with Min verbal and visual cues. SLP Short Term Goal 2 (Week 6): Patient will demonstrate sustained attention to functional tasks for 30 minutes with Mod verbal cues for redirection. SLP Short Term Goal 3 (Week 6): Patient will demonstrate functional problem solving for basic and familiar tasks with Min verbal cues.  Skilled Therapeutic Interventions: Skilled treatment session focused on cognitive goals. Patient ambulated to and from the SLP office with supervision but mod verbal cues for navigation. SLP facilitated session by providing supervision level verbal cues for problem solving and attention during a familiar "family game night" card game to assess ability to participate upon discharge.  Patient continues to demonstrate increased awareness by asking appropriate questions in regards medications, language impairments, etc. Patient participated in a response naming task with 100% accuracy but required Max verbal cues for verbal fluency tasks as well as divergent and convergent naming tasks. Patient was also independently oriented to situation and time and mild cues for orientation to place. Patient performed basic toileting tasks and self-care tasks at the sink with Mod I. Patient left upright in the recliner with alarm on and all needs within reach. Continue with current plan of care.      Pain Pain Assessment Pain Scale: 0-10 Pain Score: 8  Faces Pain Scale: Hurts a little bit Pain Location: Leg Pain Orientation: Right Pain Intervention(s): Medication (See eMAR)  Therapy/Group: Individual Therapy  Freda Jaquith,  Meagen Limones 06/17/2020, 10:08 AM

## 2020-06-17 NOTE — Progress Notes (Signed)
Occupational Therapy Weekly Progress Note  Patient Details  Name: Adam Bates MRN: [id number] Date of Birth: 1970/12/09  Beginning of progress report period: June 10, 2020 End of progress report period: June 17, 2020  Today's Date: 06/17/2020 OT Individual Time: 1300-1400 OT Individual Time Calculation (min): 60 min    Patient has met 3 of 3 short term goals.  Pt continues to make excellent progress. His orientation has improved significantly, as well as his attention, motor planning, sequencing, and independence with ADL tasks. His wife has been present for family education sessions and these will continue.   Patient continues to demonstrate the following deficits: muscle weakness, decreased motor planning, decreased initiation, decreased attention, decreased awareness, decreased problem solving, decreased safety awareness, decreased memory and delayed processing and decreased standing balance, decreased postural control and decreased balance strategies and therefore will continue to benefit from skilled OT intervention to enhance overall performance with BADL and Reduce care partner burden.  Patient progressing toward long term goals..  Continue plan of care.  OT Short Term Goals Week 5:  OT Short Term Goal 1 (Week 5): Pt will use external cues to increase orientation to x2 OT Short Term Goal 1 - Progress (Week 5): Met OT Short Term Goal 2 (Week 5): Pt will increase attention to task as demonstrated by requiring no more than min cueing during bathing OT Short Term Goal 2 - Progress (Week 5): Met OT Short Term Goal 3 (Week 5): Pt will ask for assist when needed during ADLs with no more than min cueing to demo improved self-awareness OT Short Term Goal 3 - Progress (Week 5): Met Week 6:  OT Short Term Goal 1 (Week 6): STG=LTG d/t ELOS   Skilled Therapeutic Interventions/Progress Updates:    pt received sitting in recliner with no c/o pain, agreeable to OT session. Pt able to  plan for shower appropriately, picking out clothes he needed. Ambulatory transfer to shower with supervision with RW. Pt required min cueing for safety, staying seated when doffing. He completed all bathing with min cueing for thoroughness and to not scrub head over wound. Pt required min A to don underwear in standing. Supervision to don pants when he remained seated. Pt able to don socks and shoes with supervision. Oral care with set up assist. Pt completed 200 ft of functional mobility with the RW with supervision to the ADL apt. Practiced a floor transfer from couch to floor. Pt able to posteriorly lift himself back to couch with supervision. He was unable to tolerate any other method d/t R LE pain and knee ROM limitations. Pt then completed BITS for cognitive retraining task. He required mod cueing and was 50% accurate in working memory task with 2-3 item sequence, both with picture and verbal cueing. Pt returned to his room with supervision. Pt left sitting up in the recliner with all needs met, NT attending to needs.   Therapy Documentation Precautions:  Precautions Precautions: Fall Precaution Comments: no longer has PEG Other Brace: prevalon boot  for wound on pt's R heel Restrictions Weight Bearing Restrictions: No RLE Weight Bearing: Weight bearing as tolerated Other Position/Activity Restrictions: may progress to WBAT per ortho 05/21/20  Therapy/Group: Individual Therapy  Adam Bates 06/17/2020, 6:20 AM
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
## 2020-06-18 NOTE — Progress Notes (Signed)
Patient ID: CHEVY SWEIGERT, male   DOB: [birth year], 50 y.o.   MRN: [id number]  SW confirmed with [facility] @ [place] location referral received for outpatient PT/OT/SLP and will follow-up with family to discuss scheduling.   [name], MSW, [place] Office: [phone] Cell: [phone] Fax: [phone]

## 2020-06-18 NOTE — Progress Notes (Signed)
Physical Therapy Session Note  Patient Details  Name: Adam Bates MRN: [id number] Date of Birth: 04/14/1971  Today's Date: 06/18/2020 PT Individual Time: 1415-1530 PT Individual Time Calculation (min): 75 min   Short Term Goals: Week 6:  PT Short Term Goal 1 (Week 6): STG=LTG due to ELOS.  Skilled Therapeutic Interventions/Progress Updates:     Patient in recliner in the room upon PT arrival. Patient alert and agreeable to PT session. Patient denied pain during session. Patient unable to recall the goal of this afternoon's session. PT informed him he would be going outside to see his wife and his kids for family education. Patient appropriately excited and asked to use the bathroom first.   Patient performed ambulatory transfer to/form the bathroom with supervision. Patient was continent of bladder during toileting and performed peri-care and lower body clothing management independently.   Patient was transported to/from the main entrance at the [facility] in [place] w/c with dependent assist for energy conservation and time management. Patient able to tell therapist his 4 kids names in descending ages during transport.  Patient met his family outside, tolerated high stimulation environment with intermittent distraction and perseveration on seeing who was behind him. Patient engaged in appropriate conversation with his wife and kids and tolerated being outside with them >1 hour.   PT initiated family education with patient and his family. Provided education on patient's injuries and behavior management: limiting distractions, speaking one at a time to the patient, providing the patient rest breaks, and encouraging the patient to use his RW with all mobility for reduced fall risk. Patient attentive initially, then asked that the therapist not continue, as he wanted to spend time with his kids. PT provided positive reinforcement for patient's communication, and continued family education with  patient's wife as the patient spent time talking to his kids.   Provided continued education on home modification, management and removal of car keys, weapons, alcohol, and other safety concerns at home. Discussed behavior management and triggers, d/c planning and transition of patient's schedule from the hospital to home. Encouraged a consistent daily schedule, incorporating therapeutic activities for the patient during children's school hours at home, and educated on 24/7 supervision and reaching out for support for transporting the kids to/from activities/events and for respite care. Will continue education in family meeting scheduled for tomorrow.   Upon returning patient to the room, patient was in excellent spirits and very appreciative of therapist providing this session for him.    Patient in recliner in the room  at end of session with breaks locked, chair alarm set, and all needs within reach.    Therapy Documentation Precautions:  Precautions Precautions: Fall Precaution Comments: no longer has PEG Other Brace: prevalon boot  for wound on pt's R heel Restrictions Weight Bearing Restrictions: No RLE Weight Bearing: Weight bearing as tolerated Other Position/Activity Restrictions: may progress to WBAT per ortho 05/21/20   Therapy/Group: Individual Therapy  Lyndie Vanderloop L Clee Pandit PT, DPT  06/18/2020, 5:24 PM

## 2020-06-18 NOTE — Progress Notes (Signed)
Occupational Therapy Session Note  Patient Details  Name: Adam Bates MRN: [id number] Date of Birth: 12-May-1970  Today's Date: 06/18/2020 OT Individual Time: 7943-2761 OT Individual Time Calculation (min): 62 min    Short Term Goals: Week 1:  OT Short Term Goal 1 (Week 1): Pt will complete LB dressing with mod assist. OT Short Term Goal 1 - Progress (Week 1): Met OT Short Term Goal 2 (Week 1): Pt will follow 1-step commands during a self-care task with 50% accuracy. OT Short Term Goal 2 - Progress (Week 1): Met OT Short Term Goal 3 (Week 1): Pt will complete functional transfers with mod assist OT Short Term Goal 3 - Progress (Week 1): Met OT Short Term Goal 4 (Week 1): Pt will complete bathing with mod assist. OT Short Term Goal 4 - Progress (Week 1): Progressing toward goal  Skilled Therapeutic Interventions/Progress Updates:    1:1. Pt received in recliner agreeable to OT. Pt completes bathing at shower level with VC for sequencing bathing body parts only. Pt requries VC for seated posture during dressing attempting to doff and don pants in standing. Educated on safety and sitting for LB dressing. Pt able to recall this OTs name at beginning of session. Pt completes grooming standing at sink. Pt competles all transfers, ambulation and standing trials with VC for RW management and no LOB. Pt stands at high low table to play game of checkers with pt placed L of center for forced head turning/Rattention/scanning. Pt with 4 instances of not seeing OT move checker on far R of board. Pt negotiates ramp, mulch and curb step with cueing as stated above. Despite telling pt what to write in memory notebook when getting back to the room, pt unable to recall words requiring max cuing. Exited session with pt seated in bed, exit alarm on and call light in reach   Therapy Documentation Precautions:  Precautions Precautions: Fall Precaution Comments: no longer has PEG Other Brace: prevalon boot   for wound on pt's R heel Restrictions Weight Bearing Restrictions: No RLE Weight Bearing: Weight bearing as tolerated Other Position/Activity Restrictions: may progress to WBAT per ortho 05/21/20 General:   Vital Signs: Therapy Vitals Temp: 98.1 F (36.7 C) Pulse Rate: 88 Resp: 14 BP: 106/67 Patient Position (if appropriate): Lying Oxygen Therapy SpO2: 92 % O2 Device: Room Air Pain:   ADL:   Vision   Perception    Praxis   Exercises:   Other Treatments:     Therapy/Group: Individual Therapy  Tonny Branch 06/18/2020, 6:49 AM

## 2020-06-18 NOTE — Plan of Care (Signed)
  Problem: Consults Goal: RH BRAIN INJURY PATIENT EDUCATION Description: Description: See Patient Education module for eduction specifics Outcome: Progressing Goal: Skin Care Protocol Initiated - if Braden Score 18 or less Description: If consults are not indicated, leave blank or document N/A Outcome: Progressing Goal: Nutrition Consult-if indicated Outcome: Progressing   Problem: RH BOWEL ELIMINATION Goal: RH STG MANAGE BOWEL WITH ASSISTANCE Description: STG Manage Bowel with mod I Assistance. Outcome: Progressing Goal: RH STG MANAGE BOWEL W/MEDICATION W/ASSISTANCE Description: STG Manage Bowel with Medication with mod I Assistance. Outcome: Progressing   Problem: RH BLADDER ELIMINATION Goal: RH STG MANAGE BLADDER WITH ASSISTANCE Description: STG Manage Bladder With mod I Assistance Outcome: Progressing   Problem: RH SKIN INTEGRITY Goal: RH STG MAINTAIN SKIN INTEGRITY WITH ASSISTANCE Description: STG Maintain Skin Integrity With mod I Assistance. Outcome: Progressing Goal: RH STG ABLE TO PERFORM INCISION/WOUND CARE W/ASSISTANCE Description: STG Able To Perform Incision/Wound Care With mod I Assistance. Outcome: Progressing   Problem: RH SAFETY Goal: RH STG ADHERE TO SAFETY PRECAUTIONS W/ASSISTANCE/DEVICE Description: STG Adhere to Safety Precautions With min Assistance/Device. Outcome: Progressing   Problem: RH COGNITION-NURSING Goal: RH STG USES MEMORY AIDS/STRATEGIES W/ASSIST TO PROBLEM SOLVE Description: STG Uses Memory Aids/Strategies With min Assistance to Problem Solve. Outcome: Progressing Goal: RH STG ANTICIPATES NEEDS/CALLS FOR ASSIST W/ASSIST/CUES Description: STG Anticipates Needs/Calls for Assist With min Assistance/Cues. Outcome: Progressing   Problem: RH PAIN MANAGEMENT Goal: RH STG PAIN MANAGED AT OR BELOW PT'S PAIN GOAL Description: Pain scale <4/10 Outcome: Progressing   Problem: RH KNOWLEDGE DEFICIT BRAIN INJURY Goal: RH STG INCREASE KNOWLEDGE OF  SELF CARE AFTER BRAIN INJURY Outcome: Progressing

## 2020-06-18 NOTE — Progress Notes (Signed)
Occupational Therapy Session Note  Patient Details  Name: Adam Bates MRN: [id number] Date of Birth: 1970-10-08  Today's Date: 06/18/2020 OT Individual Time: 3825-0539 OT Individual Time Calculation (min): 40 min    Short Term Goals: Week 6:  OT Short Term Goal 1 (Week 6): STG=LTG d/t ELOS  Skilled Therapeutic Interventions/Progress Updates:    Pt received in recliner with no c/o pain ,agreeable to OT session. Pt completed 200 ft of functional mobility to the ADL apt with close supervision with RW. He reported pain in his RLE and requested that OT wrap his RLE with ace wrap to provide more support/pain relief. Remainder of session focused on cognitive remediation. Sorting task completed with functional food items (I.e. empty boxes of food) completed. Pt was instructed to sort boxes of food into categories- items that needed to be heated to prepare vs items already ready to consume. He required mod cueing for item naming with language of confusion present. Poor carryover for any naming of items. Pt required min cueing overall for correct category recognition and sequencing. Poor recognition of errors. Pt completed task again with the same food items but different categories- snack food vs meal items. His language was worse with this activity but he was more accurate in sorting. Pt becoming more emotional this session re missing his family. Emotional support provided. Pt returned to his room where his pastor was waiting for him. He was left sitting up in the recliner with the chair alarm set, all needs met.   Therapy Documentation Precautions:  Precautions Precautions: Fall Precaution Comments: no longer has PEG Other Brace: prevalon boot  for wound on pt's R heel Restrictions Weight Bearing Restrictions: No RLE Weight Bearing: Weight bearing as tolerated Other Position/Activity Restrictions: may progress to WBAT per ortho 05/21/20   Therapy/Group: Individual Therapy  Curtis Sites 06/18/2020, 2:15 PM

## 2020-06-18 NOTE — Progress Notes (Signed)
Speech Language Pathology Daily Session Note  Patient Details  Name: Adam Bates MRN: [id number] Date of Birth: 1971-02-26  Today's Date: 06/18/2020 SLP Individual Time: 0800-0900 SLP Individual Time Calculation (min): 60 min  Short Term Goals: Week 6: SLP Short Term Goal 1 (Week 6): Patient will utilize external aids for orientation to time, place and situation with Min verbal and visual cues. SLP Short Term Goal 2 (Week 6): Patient will demonstrate sustained attention to functional tasks for 30 minutes with Mod verbal cues for redirection. SLP Short Term Goal 3 (Week 6): Patient will demonstrate functional problem solving for basic and familiar tasks with Min verbal cues.  Skilled Therapeutic Interventions: Skilled treatment session focused on cognitive goals. SLP facilitated session by providing Max A verbal cues for intellectual awareness regarding goals of each therapy discipline.  Max A verbal cues were also needed for use of daily schedule to anticipate upcoming therapy appointments. Patient asked appropriate questions regarding "things" he needs to do in order to go home. SLP educated patient on importance of using his call bell to maximize safety, utilizing his daily schedule and memory notebook for recall . He verbalized understanding. Patient also had questions about current medications as he feels pills are just handed to him without explanation. SLP provided patient with a visual aid containing names of medications and their functions. Patient left upright in the recliner with alarm on and all needs within reach. Continue with current plan of care.      Pain No/Denies Pain   Therapy/Group: Individual Therapy  Rigel Filsinger 06/18/2020, 3:12 PM

## 2020-06-19 NOTE — Progress Notes (Signed)
Patient ID: Adam Bates, male   DOB: 07/02/1970, 50 y.o.   MRN: [id number]  Patient/Family Conference  Patient/family in attendance: [name] (wife), and Aaron Edelman (brother)  Staff in attendance: Weston Anna (SLP), Apolinar Junes (PT), Mariane Masters (OT), Dr. Naaman Plummer (attending), [name] (SW)  Main focus: discussion of patient gains made here in rehab, and care needs at discharge.   Synopsis of information shared: Pt has been given Ritalin to assist with attention, medications to help with mood have been working well; pt will not d/c on lovenox, and leg should be monitored by orthopedic; pt has become better with recall such as remembering specific steps to complete a task; encouraged use of memory notebook; pt will d/c on regular diet and take pills whole with applesauce; pt does have R attention deficit and mild memory impairment per therapy. Therapists provided suggestions on how to redirect patient when faced with more persistent behaviors.   Barriers/concerns expressed by patient and family: How to manage paranoia, using stairs in the home, and continue to ensure transition home is safe.   Patient/family response: wife intends to put bedroom downstairs to avoid stairs.  Wife will continue to encourage use of memory notebook  Follow-up/action plans: Family education completed. Outpatient therapies set up and DME ordered (RW). Pt will follow-up in clinic with Dr. Naaman Plummer for continuity of care.   [name], MSW, [place] Office: [phone] Cell: [phone] Fax: [phone]
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
## 2020-06-19 NOTE — Progress Notes (Signed)
Speech Language Pathology Weekly Progress and Session Note  Patient Details  Name: DECLIN RAJAN MRN: [id number] Date of Birth: 05/18/1970  Beginning of progress report period: June 13, 2020 End of progress report period: June 19, 2020  Today's Date: 06/19/2020 SLP Individual Time: 1300-1355 SLP Individual Time Calculation (min): 55 min  Short Term Goals: Week 6: SLP Short Term Goal 1 (Week 6): Patient will utilize external aids for orientation to time, place and situation with Min verbal and visual cues. SLP Short Term Goal 1 - Progress (Week 6): Not met SLP Short Term Goal 2 (Week 6): Patient will demonstrate sustained attention to functional tasks for 30 minutes with Mod verbal cues for redirection. SLP Short Term Goal 2 - Progress (Week 6): Met SLP Short Term Goal 3 (Week 6): Patient will demonstrate functional problem solving for basic and familiar tasks with Min verbal cues. SLP Short Term Goal 3 - Progress (Week 6): Met    New Short Term Goals: Week 7: SLP Short Term Goal 1 (Week 7): STGs=LTGs due to ELOS  Weekly Progress Updates:Patient continues to demonstrate excellent progress and has met 2 of 3 STGs this reporting period. Currently, patient demonstrates behaviors consistent with a Rancho Level VI-VII and requires overall Min verbal cues to complete functional and familiar tasks safely in regards to attention and problem solving. Moderate verbal cues are needed for recall with use of external aids and emergent awareness of errors. Patient also demonstrates improved ability to express mildly abstract thoughts with minimal agitation noted. Patient and family education ongoing. Patient would benefit from continued skilled SLP intervention to maximize his cognitive functioning and overall functional independence prior to discharge.     Intensity: Minumum of 1-2 x/day, 30 to 90 minutes Frequency: 3 to 5 out of 7 days Duration/Length of Stay:  06/24/20 Treatment/Interventions: Cognitive remediation/compensation;Environmental controls;Cueing hierarchy;Functional tasks;Therapeutic Activities;Therapeutic Exercise;Dysphagia/aspiration precaution training;Medication managment;Patient/family education   Daily Session  Skilled Therapeutic Interventions: Skilled treatment session focused on cognitive goals. SLP facilitated session by providing Max A verbal cues for utilization of external memory aids for recall of events from previous therapy sessions. SLP re-educated patient on the importance of using his memory notebook, he verbalized understanding. SLP also facilitated session by providing Mod verbal and visual cues for attention to right field of environment while locating and talking about pictures of his family. Min-Mod verbal cues were also needed to self-monitor and correct errors while identifying family members (sister vs wife, etc). Patient performed a structured visual scanning task in which he located all occurences of the single stimuli. Patient was continent of bladder and performed task with supervision. Patient left upright in recliner with alarm on and all needs within reach. Continue with current plan of care.     Pain No/Denies Pain   Therapy/Group: Individual Therapy  Adrin Julian 06/19/2020, 6:26 AM

## 2020-06-19 NOTE — Plan of Care (Signed)
  Problem: Consults Goal: RH BRAIN INJURY PATIENT EDUCATION Description: Description: See Patient Education module for eduction specifics Outcome: Progressing Goal: Skin Care Protocol Initiated - if Braden Score 18 or less Description: If consults are not indicated, leave blank or document N/A Outcome: Progressing Goal: Nutrition Consult-if indicated Outcome: Progressing   Problem: RH BOWEL ELIMINATION Goal: RH STG MANAGE BOWEL WITH ASSISTANCE Description: STG Manage Bowel with mod I Assistance. Outcome: Progressing Goal: RH STG MANAGE BOWEL W/MEDICATION W/ASSISTANCE Description: STG Manage Bowel with Medication with mod I Assistance. Outcome: Progressing   Problem: RH BLADDER ELIMINATION Goal: RH STG MANAGE BLADDER WITH ASSISTANCE Description: STG Manage Bladder With mod I Assistance Outcome: Progressing   Problem: RH SKIN INTEGRITY Goal: RH STG MAINTAIN SKIN INTEGRITY WITH ASSISTANCE Description: STG Maintain Skin Integrity With mod I Assistance. Outcome: Progressing Goal: RH STG ABLE TO PERFORM INCISION/WOUND CARE W/ASSISTANCE Description: STG Able To Perform Incision/Wound Care With mod I Assistance. Outcome: Progressing   Problem: RH SAFETY Goal: RH STG ADHERE TO SAFETY PRECAUTIONS W/ASSISTANCE/DEVICE Description: STG Adhere to Safety Precautions With min Assistance/Device. Outcome: Progressing   Problem: RH COGNITION-NURSING Goal: RH STG USES MEMORY AIDS/STRATEGIES W/ASSIST TO PROBLEM SOLVE Description: STG Uses Memory Aids/Strategies With min Assistance to Problem Solve. Outcome: Progressing Goal: RH STG ANTICIPATES NEEDS/CALLS FOR ASSIST W/ASSIST/CUES Description: STG Anticipates Needs/Calls for Assist With min Assistance/Cues. Outcome: Progressing   Problem: RH PAIN MANAGEMENT Goal: RH STG PAIN MANAGED AT OR BELOW PT'S PAIN GOAL Description: Pain scale <4/10 Outcome: Progressing   Problem: RH KNOWLEDGE DEFICIT BRAIN INJURY Goal: RH STG INCREASE KNOWLEDGE OF  SELF CARE AFTER BRAIN INJURY Outcome: Progressing

## 2020-06-19 NOTE — Progress Notes (Signed)
Occupational Therapy Session Note  Patient Details  Name: Adam Bates MRN: [id number] Date of Birth: 06/19/1970  Today's Date: 06/19/2020 OT Individual Time: 1430-1555 OT Individual Time Calculation (min): 85 min   Skilled Therapeutic Interventions/Progress Updates:    1:1. Pt received in bed agreeable to OT with no pain reported however RW utilized to maintain less weight in RLE. Pt and OT review memory notebook with pt recalling wife and brother visiting in the morning. Pt unable ot recall going up the stairs or worksheet from SLP. Pt completes all mobility to/from tx spaces with supervision and VC for Rw management. Pt and OT bake brownies in kitchen for focus on visual scanning, following directions, sequencing, reading and memory. Pt unale to recall measurements of items, however able to describe needed items for baking. Pt ONLY LOOKS AT L HALF of instructions skipping R visual field suggesting inattention/field cut. Of note later when pt writing in memory notebook, pt only writes on L half of notebook. Pt automatically cleans dishes and counters after seated rest. Good safety awareness in kitchen however OT manages oven/hot items. Pt completes trail making B test while brownies baking. Pt completes in 5 min 45 seconds (25 second improvement) with mod VC for R attention. Pt also educated on decreasing stimulation and taking breaks at home away from kids in safe space as pt sensitive to auditory stimuli. Exited session with pt seated in bed, exit alarm on and call light in reach   Brownies, education- low stim Clean up  Therapy Documentation Precautions:  Precautions Precautions: Fall Precaution Comments: no longer has PEG Other Brace: prevalon boot  for wound on pt's R heel Restrictions Weight Bearing Restrictions: No RLE Weight Bearing: Weight bearing as tolerated Other Position/Activity Restrictions: may progress to WBAT per ortho 05/21/20 General:   Vital Signs: Therapy  Vitals Temp: 97.6 F (36.4 C) Temp Source: Oral Pulse Rate: 60 Resp: 16 BP: 109/70 Patient Position (if appropriate): Lying Oxygen Therapy SpO2: 98 % Pain:   ADL:   Vision   Perception    Praxis   Exercises:   Other Treatments:     Therapy/Group: Individual Therapy  Tonny Branch 06/19/2020, 7:00 AM

## 2020-06-19 NOTE — Progress Notes (Signed)
Physical Therapy Session Note  Patient Details  Name: Adam Bates MRN: [id number] Date of Birth: 02-Oct-1970  Today's Date: 06/19/2020 PT Individual Time: 0930-1026 PT Individual Time Calculation (min): 56 min   Short Term Goals: Week 6:  PT Short Term Goal 1 (Week 6): STG=LTG due to ELOS.  Skilled Therapeutic Interventions/Progress Updates:     Patient in recliner upon PT arrival. Patient alert and agreeable to PT session. Patient reported "tolerable" R lower extremity pain during session, RN made aware. PT provided repositioning, rest breaks, and distraction as pain interventions throughout session.   Patient's wife and brother arrived for family education during session.   Therapeutic Activity: Transfers: Patient performed sit to/from stand from recliner, standard arm chair, and mat table with supervision with appropriate use of RW. Provided verbal cues for safe guarding and consistent use of RW with all mobility. Patient performed a simulated van height car transfer with supervison using RW. Provided cues for safe technique.  Gait Training:  Patient ambulated >100 feet x2 and >200 feet using RW with supervision. Ambulated with decreased gait speed and mild antalgic gait on R. Provided verbal cues for safe guarding technique for family and R visual scanning for improved R side awareness for safety. Patient ascended/descended 8 steps using R rail x4 and L rail x4 with B upper extremity support on 1 rail to simulate home set-up with CGA for safety and teach back method for technique with 100% recall from patient. Performed step-to gait pattern leading with L while ascending and R while descending. Provided cues for safe guarding technique, fall safety, how to assist in the event of a fall, and activation of emergency services in the event of a fall resulting in injury.  Patient ambulated up/down a ramp, over 10 feet of mulch (unlevel surface), and up/down a curb to simulate community  ambulation over unlevel surfaces with close supervision using RW. Provided cues for technique and use of AD and safe guarding technique.  Educated patient and his family on fall risk/prevention, home modifications to prevent falls, and activation of emergency services in the event of a fall during session. Recommended continuing plan for patient to stay down stairs initially for safety, informed patient of this and reason and patient received this well.   Patient in recliner at end of session with breaks locked, chair alarm set, and all needs within reach.    Therapy Documentation Precautions:  Precautions Precautions: Fall Precaution Comments: no longer has PEG Other Brace: prevalon boot  for wound on pt's R heel Restrictions Weight Bearing Restrictions: No RLE Weight Bearing: Weight bearing as tolerated Other Position/Activity Restrictions: may progress to WBAT per ortho 05/21/20   Therapy/Group: Individual Therapy  Ercell Perlman L [place] PT, DPT  06/19/2020, 12:49 PM
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
## 2020-06-20 NOTE — Progress Notes (Signed)
Patient had a short episode of not being able to take a breath just after the emesis recorded in the I&O flow sheet this am. Episode reported to [place], [place].

## 2020-06-20 NOTE — Plan of Care (Signed)
  Problem: Consults Goal: RH BRAIN INJURY PATIENT EDUCATION Description: Description: See Patient Education module for eduction specifics Outcome: Progressing Goal: Skin Care Protocol Initiated - if Braden Score 18 or less Description: If consults are not indicated, leave blank or document N/A Outcome: Progressing Goal: Nutrition Consult-if indicated Outcome: Progressing   Problem: RH BOWEL ELIMINATION Goal: RH STG MANAGE BOWEL WITH ASSISTANCE Description: STG Manage Bowel with mod I Assistance. Outcome: Progressing Goal: RH STG MANAGE BOWEL W/MEDICATION W/ASSISTANCE Description: STG Manage Bowel with Medication with mod I Assistance. Outcome: Progressing   Problem: RH BLADDER ELIMINATION Goal: RH STG MANAGE BLADDER WITH ASSISTANCE Description: STG Manage Bladder With mod I Assistance Outcome: Progressing   Problem: RH SKIN INTEGRITY Goal: RH STG MAINTAIN SKIN INTEGRITY WITH ASSISTANCE Description: STG Maintain Skin Integrity With mod I Assistance. Outcome: Progressing Goal: RH STG ABLE TO PERFORM INCISION/WOUND CARE W/ASSISTANCE Description: STG Able To Perform Incision/Wound Care With mod I Assistance. Outcome: Progressing   Problem: RH SAFETY Goal: RH STG ADHERE TO SAFETY PRECAUTIONS W/ASSISTANCE/DEVICE Description: STG Adhere to Safety Precautions With min Assistance/Device. Outcome: Progressing   Problem: RH COGNITION-NURSING Goal: RH STG USES MEMORY AIDS/STRATEGIES W/ASSIST TO PROBLEM SOLVE Description: STG Uses Memory Aids/Strategies With min Assistance to Problem Solve. Outcome: Progressing Goal: RH STG ANTICIPATES NEEDS/CALLS FOR ASSIST W/ASSIST/CUES Description: STG Anticipates Needs/Calls for Assist With min Assistance/Cues. Outcome: Progressing   Problem: RH PAIN MANAGEMENT Goal: RH STG PAIN MANAGED AT OR BELOW PT'S PAIN GOAL Description: Pain scale <4/10 Outcome: Progressing   Problem: RH KNOWLEDGE DEFICIT BRAIN INJURY Goal: RH STG INCREASE KNOWLEDGE OF  SELF CARE AFTER BRAIN INJURY Outcome: Progressing

## 2020-06-20 NOTE — Progress Notes (Signed)
Physical Therapy Session Note  Patient Details  Name: Adam Bates MRN: [id number] Date of Birth: Jun 15, 1970  Today's Date: 06/20/2020 PT Individual Time: 0915-1015 PT Individual Time Calculation (min): 60 min   Short Term Goals: Week 1:  PT Short Term Goal 1 (Week 1): Pt will perform sit to stand w/RW and adhere to wbing precautions w/min assist PT Short Term Goal 1 - Progress (Week 1): Partly met PT Short Term Goal 2 (Week 1): stand pivot bed to/from wc w/RW and min assist adhering to wbing precautions PT Short Term Goal 2 - Progress (Week 1): Partly met PT Short Term Goal 3 (Week 1): pt will tolerate initiation of gait training PT Short Term Goal 3 - Progress (Week 1): Met PT Short Term Goal 4 (Week 1): Pt will recall route to  room from primary utilized treatment area w/minimal cueing required. PT Short Term Goal 4 - Progress (Week 1): Progressing toward goal Week 2:  PT Short Term Goal 1 (Week 2): Patient will perform basic transfers with min A while adhering to weight bearing precautions with cues consistently. PT Short Term Goal 1 - Progress (Week 2): Met PT Short Term Goal 2 (Week 2): Patient will ambulate 20 ft using LRAD with min A while adhering to weight bearing precautions. PT Short Term Goal 2 - Progress (Week 2): Met PT Short Term Goal 3 (Week 2): Pt will recall route to  room from primary utilized treatment area w/minimal cueing required. PT Short Term Goal 3 - Progress (Week 2): Progressing toward goal Week 3:  PT Short Term Goal 1 (Week 3): Patient will perform basic transfers with CGA using LRAD. PT Short Term Goal 1 - Progress (Week 3): Met PT Short Term Goal 2 (Week 3): Patient will ambulate >100 ft using LRAD for improved reslessness and increased activity tolerance. PT Short Term Goal 2 - Progress (Week 3): Met PT Short Term Goal 3 (Week 3): Patient will attend to and perform dynamic balance activities >5 min with min A for balance with single UE support. PT  Short Term Goal 3 - Progress (Week 3): Progressing toward goal  Skilled Therapeutic Interventions/Progress Updates:    PAIN 7/10 w/flexion stretch, treatment to tolerance, pt gave self breaks from streching.  Pt initially oob in recliner, awake and alert, oriented to date, gross situation and place, confounded by aphasia/word finding.  Sit to stand and gait x 116f w/RW and supervision. Performs all mat mobility independently  Knee ROM activities: Tall kneeling on mat self stretch into flexion w/UE support on chair.  Achieves position w/cues and supervision.  Repeats stetch up to 1 min at time to tolerance x 6 reps.  Seated AROM knee flexion/extension 2 x 20 Supine heel slides x 20 Prone hamstring curls x 20  Dons and doffs shoe for therex w/supervision  Functional gait: >2048fincluding stepping over poles at [id number]/improved negotiation strategy w/reps, weaving around cones, picking up cones from floor x 10, backing all w/supervision.  Gait > [id number] w/dual task of functional math.  Able to perform simple single step word problems mentally But difficulty w/2step.  Slightly Slowed cadence.   R attention activity.  Standing w/walker, first worked on tossEstate agenttherapist offset to R using R hand Advanced to locating ball held to pt R side in above/at eye level/below. Advanced to locating ball placed in different locations to pt R. Cues needed to visually scan to r vs. Guessing using visual cues not in periphery.  Pt left oob in recliner w/chair alarm set and needs in reach.     Therapy Documentation Precautions:  Precautions Precautions: Fall Precaution Comments: no longer has PEG Other Brace: prevalon boot  for wound on pt's R heel Restrictions Weight Bearing Restrictions: No RLE Weight Bearing: Weight bearing as tolerated Other Position/Activity Restrictions: may progress to WBAT per ortho 05/21/20    Therapy/Group: Individual Therapy  Callie Fielding, [place] 06/20/2020, 12:59 PM
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
## 2020-06-20 NOTE — Progress Notes (Signed)
Occupational Therapy Session Note  Patient Details  Name: Adam Bates MRN: [id number] Date of Birth: 1970-09-01  Today's Date: 06/20/2020 OT Individual Time: 0700-0810 OT Individual Time Calculation (min): 70 min    Short Term Goals: Week 1:  OT Short Term Goal 1 (Week 1): Pt will complete LB dressing with mod assist. OT Short Term Goal 1 - Progress (Week 1): Met OT Short Term Goal 2 (Week 1): Pt will follow 1-step commands during a self-care task with 50% accuracy. OT Short Term Goal 2 - Progress (Week 1): Met OT Short Term Goal 3 (Week 1): Pt will complete functional transfers with mod assist OT Short Term Goal 3 - Progress (Week 1): Met OT Short Term Goal 4 (Week 1): Pt will complete bathing with mod assist. OT Short Term Goal 4 - Progress (Week 1): Progressing toward goal  Skilled Therapeutic Interventions/Progress Updates:     Pt received in reclienr agreeable to OT with no c/o pain  ADL:  Pt completes bathing with supervision with pt requiring VC for termination of bathing body parts and VC for R attention to locate body wash Pt completes UB dressing with set up Pt completes LB dressing with set up sit to stand from Recliner. Pt requries VC for doffing pants/underwear seated Pt completes footwear with set up seated in recliner Pt completes shower/Tub transfer with set up using RW to enter/exit bathroom   Therapeutic activity Pt uses calendar for orientation to tell OT how to record in memory notebook. Pt able to recall shower and brushing teeth as well as reading in bible at end of session to list activities. When pt reading in bible to read on far R of R page pt required to lean back to see words. OT completes vision screen and pt WFL saccades, convergence and visual tracking. Pt however demo R field cut in R eye only when checking peripheral vision. Pt does well following commands during visual testing with increased time for processing and repetition.   Pt left at end  of session in recliner with exit alarm on, call light in reach and all needs met   Therapy Documentation Precautions:  Precautions Precautions: Fall Precaution Comments: no longer has PEG Other Brace: prevalon boot  for wound on pt's R heel Restrictions Weight Bearing Restrictions: No RLE Weight Bearing: Weight bearing as tolerated Other Position/Activity Restrictions: may progress to WBAT per ortho 05/21/20 General:   Vital Signs: Therapy Vitals Temp: 98.6 F (37 C) Pulse Rate: 74 Resp: 16 BP: 110/76 Patient Position (if appropriate): Lying Oxygen Therapy SpO2: 100 % Pain:   ADL:   Vision   Perception    Praxis   Exercises:   Other Treatments:     Therapy/Group: Individual Therapy  Tonny Branch 06/20/2020, 8:11 AM

## 2020-06-21 MED ORDER — PROPRANOLOL HCL 10 MG PO TABS
10.0000 mg | ORAL_TABLET | Freq: Three times a day (TID) | ORAL | Status: DC
  Administered 2020-06-21 – 2020-06-22 (×2): 10 mg via ORAL
  Filled 2020-06-21 (×2): qty 1

## 2020-06-21 NOTE — Progress Notes (Signed)
Occupational Therapy Discharge Summary  Patient Details  Name: Adam Bates MRN: [id number] Date of Birth: 06/02/1970   Patient has met 10 of 10 long term goals due to improved activity tolerance, improved balance, postural control, ability to compensate for deficits, functional use of  RIGHT lower extremity, improved attention, improved awareness and improved coordination.  Patient to discharge at overall Supervision level.  Patient's care partner is independent to provide the necessary cognitive assistance at discharge. Family education has been completed with pt's wife Adam Bates.   Reasons goals not met: All treatment goals met.   Recommendation:  Patient will benefit from ongoing skilled OT services in outpatient setting to continue to advance functional skills in the area of BADL, iADL, Vocation and Reduce care partner burden.  Equipment: No equipment provided  Reasons for discharge: treatment goals met and discharge from hospital  Patient/family agrees with progress made and goals achieved: Yes  OT Discharge Precautions/Restrictions  Precautions Precautions: Fall Precaution Comments: no longer has PEG Required Braces or Orthoses: Other Brace Other Brace: prevalon boot  for wound on pt's R heel Restrictions RLE Weight Bearing: Weight bearing as tolerated Other Position/Activity Restrictions: may progress to WBAT per ortho 05/21/20 Pain Pain Assessment Pain Score: 0-No pain ADL ADL Eating: Set up Grooming: Setup Where Assessed-Grooming: Standing at sink Upper Body Bathing: Setup Where Assessed-Upper Body Bathing: Shower Lower Body Bathing: Supervision/safety Where Assessed-Lower Body Bathing: Shower Upper Body Dressing: Setup Where Assessed-Upper Body Dressing: Chair Lower Body Dressing: Supervision/safety Where Assessed-Lower Body Dressing: Chair Toileting: Setup Toilet Transfer: Distant supervision Toilet Transfer Method: Ambulating Tub/Shower Transfer: Close  supervison Tub/Shower Transfer Method: Ambulating Vision Baseline Vision/History: Wears glasses Wears Glasses: At all times Vision Assessment?: Yes Ocular Range of Motion: Within Functional Limits Alignment/Gaze Preference: Within Defined Limits Tracking/Visual Pursuits: Able to track stimulus in all quads without difficulty Saccades: Within functional limits Convergence: Within functional limits Visual Fields: Right visual field deficit   Praxis Praxis: Intact Praxis Impairment Details: Perseveration Cognition Overall Cognitive Status: Impaired/Different from baseline Arousal/Alertness: Awake/alert Orientation Level: Oriented X4 Attention: Selective Focused Attention: Appears intact Sustained Attention: Appears intact Selective Attention: Impaired Selective Attention Impairment: Functional basic;Verbal basic Memory: Impaired Memory Impairment: Retrieval deficit;Decreased recall of new information Awareness: Impaired Awareness Impairment: Emergent impairment Problem Solving: Impaired Problem Solving Impairment: Verbal complex;Functional complex Safety/Judgment: Impaired [employer] Scales of Cognitive Functioning: Confused/appropriate Sensation Sensation Light Touch: Appears Intact Proprioception: Appears Intact Coordination Gross Motor Movements are Fluid and Coordinated: Yes Fine Motor Movements are Fluid and Coordinated: No Motor  Motor Motor: Within Functional Limits Mobility  Bed Mobility Rolling Right: Independent with assistive device Rolling Left: Independent with assistive device Right Sidelying to Sit: Independent with assistive device Supine to Sit: Independent with assistive device Sit to Supine: Independent with assistive device Transfers Sit to Stand: Set up assist Stand to Sit: Set up assist  Trunk/Postural Assessment  Cervical Assessment Cervical Assessment: Within Functional Limits Thoracic Assessment Thoracic Assessment: Within  Functional Limits Lumbar Assessment Lumbar Assessment: Within Functional Limits Postural Control Postural Control: Deficits on evaluation Postural Limitations: improved since eval  Balance Balance Balance Assessed: Yes Dynamic Sitting Balance Dynamic Sitting - Balance Support: During functional activity Dynamic Sitting - Level of Assistance: 6: Modified independent (Device/Increase time) Dynamic Standing Balance Dynamic Standing - Balance Support: During functional activity Dynamic Standing - Level of Assistance: 5: Stand by assistance Extremity/Trunk Assessment RUE Assessment RUE Assessment: Within Functional Limits LUE Assessment LUE Assessment: Within Functional Limits   Tonny Branch 06/21/2020, 6:59  AM

## 2020-06-21 NOTE — Progress Notes (Signed)
Occupational Therapy Session Note  Patient Details  Name: Adam Bates MRN: [id number] Date of Birth: Sep 14, 1970  Today's Date: 06/21/2020 OT Individual Time: 6015-6153 OT Individual Time Calculation (min): 60 min    Short Term Goals: Week 1:  OT Short Term Goal 1 (Week 1): Pt will complete LB dressing with mod assist. OT Short Term Goal 1 - Progress (Week 1): Met OT Short Term Goal 2 (Week 1): Pt will follow 1-step commands during a self-care task with 50% accuracy. OT Short Term Goal 2 - Progress (Week 1): Met OT Short Term Goal 3 (Week 1): Pt will complete functional transfers with mod assist OT Short Term Goal 3 - Progress (Week 1): Met OT Short Term Goal 4 (Week 1): Pt will complete bathing with mod assist. OT Short Term Goal 4 - Progress (Week 1): Progressing toward goal  Skilled Therapeutic Interventions/Progress Updates:    1:1. Pt recieevd in bed eating breakfast. Pt and OT reviewed orientation questions while pt eats. Pt completes toilting/transfer with set up-mod I with RW. Pt changes clothing with set up sit to stand at EOB. Pt completes bowel and bladder void in toilet. Pt completes grooming and hand hygeine at sink with set up. Pt participates in rhythmic drumming to preferred 80s rock music with pt needing to name band or song to play to work on expression. Bimanual coordination, timing andsequencing targeted in drumming activity. Pt completes number search in hallway with pt missing 4/15 numbers (3 on R side of hallway) discuss implicaitons of R inattention/field cut. Exited session with pt seated in recliner, exit alarm on and call light in reach   Therapy Documentation Precautions:  Precautions Precautions: Fall Precaution Comments: no longer has PEG Other Brace: prevalon boot  for wound on pt's R heel Restrictions Weight Bearing Restrictions: No RLE Weight Bearing: Weight bearing as tolerated Other Position/Activity Restrictions: may progress to WBAT per ortho  05/21/20 General:   Vital Signs: Therapy Vitals Temp: 98.2 F (36.8 C) Temp Source: Oral Pulse Rate: 75 Resp: 20 BP: 110/87 Patient Position (if appropriate): Sitting Oxygen Therapy SpO2: 100 % O2 Device: Room Air Pain: Pain Assessment Pain Score: Asleep ADL:   Vision   Perception    Praxis   Exercises:   Other Treatments:     Therapy/Group: Individual Therapy  Tonny Branch 06/21/2020, 6:54 AM
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
## 2020-06-21 NOTE — Progress Notes (Signed)
Speech Language Pathology Discharge Summary  Patient Details  Name: Adam Bates MRN: [id number] Date of Birth: 28-Dec-1970  Today's Date: 06/23/2020 SLP Individual Time: 1400-1440 SLP Individual Time Calculation (min): 40 min   Skilled Therapeutic Interventions:  Pt was seen for skilled ST targeting cognition. Pt was pleasant and cooperative throughout session, although by end of session he did report significant mental and physical fatigue since he had been "awake since 4AM" and requested to rest. Therefore, pt missed last 20 mins of skilled ST session. During session, he did participate in formal cognitive testing via the SLUMS Examination as means of a post-test for this admission. Pt scored 14/30, which is still indicative of moderate neurocognitive impairment (WNL=27 or >). He demonstrated the most difficulty with short term memory and reading comprehension tasks. He also required Min A verbal cueing for problem solving a basic calculation (money related) question from the test, therefore he did not receive the points for the standard score. During clock drawing subtest, he demonstrated ability to self detect and correct errors in placement of clock hands to reflect accurate time. Pt also still required Min A verbal cues for topic maintenance and comprehension of more complex instructions. Pt left sitting in recliner with alarm set and needs within reach. Continue per current plan of care.      Patient has met 10 of 10 long term goals.  Patient to discharge at [facility] level.   Reasons goals not met: N/A   Clinical Impression/Discharge Summary: Patient has made excellent gains and has met 10 of 10 LTGs this admission. Currently, patient is consuming regular textures with thin liquids without overt s/s of aspiration and is overall Mod I for use of swallowing compensatory strategies. Currently also demonstrates behaviors consistent with a [place]o Level VI-emerging VII and requires overall Min  A multimodal cues to complete functional and familiar tasks safely regarding problem solving, selective attention, rightt visual scanning, recall with use of strategies and emergent awareness of errors. Patient initially demonstrated severe language of confusion which has significantly improved along with his overall cognitive functioning. 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 Despite this, patient is able to make his overall wants/needs know consistently. Patient and family education is complete and patient will discharge home with 24 hour supervision from family. Patient would benefit from f/u outpatient SLP services to maximize his cognitive-linguistic functioning and overall functional independence in order to reduce caregiver burden.   Care Partner:  Caregiver Able to Provide Assistance: Yes  Type of Caregiver Assistance: Physical;Cognitive  Recommendation:  24 hour supervision/assistance;Outpatient SLP  Rationale for SLP Follow Up: Reduce caregiver burden;Maximize cognitive function and independence;Maximize functional communication   Equipment: N/A   Reasons for discharge: Discharged from hospital;Treatment goals met   Patient/Family Agrees with Progress Made and Goals Achieved: Yes    Arbutus Leas 06/23/2020, 7:18 AM

## 2020-06-21 NOTE — Plan of Care (Signed)
  Problem: Consults Goal: RH BRAIN INJURY PATIENT EDUCATION Description: Description: See Patient Education module for eduction specifics Outcome: Progressing Goal: Skin Care Protocol Initiated - if Braden Score 18 or less Description: If consults are not indicated, leave blank or document N/A Outcome: Progressing Goal: Nutrition Consult-if indicated Outcome: Progressing   Problem: RH BOWEL ELIMINATION Goal: RH STG MANAGE BOWEL WITH ASSISTANCE Description: STG Manage Bowel with mod I Assistance. Outcome: Progressing Goal: RH STG MANAGE BOWEL W/MEDICATION W/ASSISTANCE Description: STG Manage Bowel with Medication with mod I Assistance. Outcome: Progressing   Problem: RH BLADDER ELIMINATION Goal: RH STG MANAGE BLADDER WITH ASSISTANCE Description: STG Manage Bladder With mod I Assistance Outcome: Progressing   Problem: RH SKIN INTEGRITY Goal: RH STG MAINTAIN SKIN INTEGRITY WITH ASSISTANCE Description: STG Maintain Skin Integrity With mod I Assistance. Outcome: Progressing Goal: RH STG ABLE TO PERFORM INCISION/WOUND CARE W/ASSISTANCE Description: STG Able To Perform Incision/Wound Care With mod I Assistance. Outcome: Progressing   Problem: RH SAFETY Goal: RH STG ADHERE TO SAFETY PRECAUTIONS W/ASSISTANCE/DEVICE Description: STG Adhere to Safety Precautions With min Assistance/Device. Outcome: Progressing   Problem: RH COGNITION-NURSING Goal: RH STG USES MEMORY AIDS/STRATEGIES W/ASSIST TO PROBLEM SOLVE Description: STG Uses Memory Aids/Strategies With min Assistance to Problem Solve. Outcome: Progressing Goal: RH STG ANTICIPATES NEEDS/CALLS FOR ASSIST W/ASSIST/CUES Description: STG Anticipates Needs/Calls for Assist With min Assistance/Cues. Outcome: Progressing   Problem: RH PAIN MANAGEMENT Goal: RH STG PAIN MANAGED AT OR BELOW PT'S PAIN GOAL Description: Pain scale <4/10 Outcome: Progressing   Problem: RH KNOWLEDGE DEFICIT BRAIN INJURY Goal: RH STG INCREASE KNOWLEDGE OF  SELF CARE AFTER BRAIN INJURY Outcome: Progressing

## 2020-06-22 MED ORDER — PROPRANOLOL HCL 10 MG PO TABS
10.0000 mg | ORAL_TABLET | Freq: Two times a day (BID) | ORAL | Status: DC
  Filled 2020-06-22 (×2): qty 1

## 2020-06-22 NOTE — Plan of Care (Signed)
  Problem: Consults Goal: RH BRAIN INJURY PATIENT EDUCATION Description: Description: See Patient Education module for eduction specifics Outcome: Progressing Goal: Skin Care Protocol Initiated - if Braden Score 18 or less Description: If consults are not indicated, leave blank or document N/A Outcome: Progressing   Problem: RH SKIN INTEGRITY Goal: RH STG MAINTAIN SKIN INTEGRITY WITH ASSISTANCE Description: STG Maintain Skin Integrity With mod I Assistance. Outcome: Progressing

## 2020-06-22 NOTE — Progress Notes (Signed)
Occupational Therapy Session Note  Patient Details  Name: DALAN COWGER MRN: [id number] Date of Birth: 1971/02/25  Today's Date: 06/22/2020 OT Individual Time: 4599-7741 OT Individual Time Calculation (min): 54 min    Short Term Goals: Week 6:  OT Short Term Goal 1 (Week 6): STG=LTG d/t ELOS  Skilled Therapeutic Interventions/Progress Updates:    Pt received sitting in recliner with no c/o pain, agreeable to OT session. Pt alert and oriented x4! 200 ft of functional mobility with RW with supervision. BITS memory task with focus of intervention on sustained attention to task, working memory, and sequencing 2-3 item list. 88% accuracy with 2 item sequence with letters instead of pictures. Pt often with language of confusion, but great improvement in language overall. Pt was 100% accurate in audibly naming letters. Pt also with good improvement in R attention and scanning for visual compensation with anchoring techniques used spontaneously, bringing his hand to the edge of the computer screen. Graded activity to 3 letter sequence and with min cueing, pt able to completed with 87% accuracy. Next activity included images and word sequence, with increased difficulty reading words initially, 85% accuracy overall so still a fantastic improvement from just last weeks performance in both sequencing, sustained attention, and overall functional communication. Lastly, 3 item image/word sequence with increased difficulty in sequencing and language, scoring just 57% but improvement in error recognition. Ended session with 5 min Nustep use to promote improvement in cardiorespiratory endurance and R knee flexion needed to participate more fully in IADLs. Pt returned to his room and was left sitting up with all needs met, chair alarm set.   Therapy Documentation Precautions:  Precautions Precautions: Fall Precaution Comments: no longer has PEG Required Braces or Orthoses: Other Brace Other Brace: prevalon boot   for wound on pt's R heel Restrictions Weight Bearing Restrictions: No RLE Weight Bearing: Weight bearing as tolerated Other Position/Activity Restrictions: may progress to WBAT per ortho 05/21/20  Therapy/Group: Individual Therapy  Curtis Sites 06/22/2020, 6:30 AM
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
## 2020-06-22 NOTE — Discharge Instructions (Signed)
Inpatient Rehab Discharge Instructions  [name] Discharge date and time: No discharge date for patient encounter.   Activities/Precautions/ Functional Status: Activity.  Weightbearing as tolerated Diet: Regular Wound Care: Routine skin checks Functional status:  ___ No restrictions     ___ Walk up steps independently ___ 24/7 supervision/assistance   ___ Walk up steps with assistance ___ Intermittent supervision/assistance  ___ Bathe/dress independently ___ Walk with walker     _x__ Bathe/dress with assistance ___ Walk Independently    ___ Shower independently ___ Walk with assistance    ___ Shower with assistance ___ No alcohol     ___ Return to work/school ________  COMMUNITY REFERRALS UPON DISCHARGE:     Outpatient: PT   OT   ST             Agency: [facility] [employer]  Phone: [phone]             Appointment Date/Time: *Please expect follow-up within 7-10 business days to schedule your visit. If you have not received follow-up, be sure to contact the site directly.*  Medical Equipment/Items Ordered: rolling walker                                                 Agency/Supplier: [facility] [phone]    Special Instructions: No driving smoking or alcohol   My questions have been answered and I understand these instructions. I will adhere to these goals and the provided educational materials after my discharge from the hospital.  Patient/Caregiver Signature _______________________________ Date __________  Clinician Signature _______________________________________ Date __________  Please bring this form and your medication list with you to all your follow-up doctor's appointments.

## 2020-06-23 ENCOUNTER — Other Ambulatory Visit (HOSPITAL_COMMUNITY): Payer: Self-pay | Admitting: Physician Assistant

## 2020-06-23 ENCOUNTER — Other Ambulatory Visit: Payer: Self-pay | Admitting: Physician Assistant

## 2020-06-23 MED ORDER — METHOCARBAMOL 500 MG PO TABS: 500.0000 mg | ORAL_TABLET | Freq: Four times a day (QID) | ORAL | 0 refills | Status: DC | PRN

## 2020-06-23 MED ORDER — ACETAMINOPHEN 325 MG PO TABS: 650.0000 mg | ORAL_TABLET | Freq: Four times a day (QID) | ORAL | Status: DC | PRN

## 2020-06-23 MED ORDER — LORAZEPAM 0.5 MG PO TABS: 0.5000 mg | ORAL_TABLET | Freq: Two times a day (BID) | ORAL | 0 refills | Status: DC | PRN

## 2020-06-23 MED ORDER — METHYLPHENIDATE HCL 20 MG PO TABS: 20.0000 mg | ORAL_TABLET | Freq: Two times a day (BID) | ORAL | 0 refills | Status: DC

## 2020-06-23 MED ORDER — CITALOPRAM HYDROBROMIDE 10 MG PO TABS: 30.0000 mg | ORAL_TABLET | Freq: Every day | ORAL | 0 refills | Status: DC

## 2020-06-23 MED ORDER — METHYLPHENIDATE HCL 10 MG PO TABS: 20.0000 mg | ORAL_TABLET | Freq: Two times a day (BID) | ORAL | 0 refills | Status: DC

## 2020-06-23 MED ORDER — FAMOTIDINE 20 MG PO TABS: 20.0000 mg | ORAL_TABLET | Freq: Two times a day (BID) | ORAL | 0 refills | Status: DC

## 2020-06-23 MED ORDER — MELATONIN 3 MG PO TABS: 3.0000 mg | ORAL_TABLET | Freq: Every day | ORAL | 0 refills | Status: AC

## 2020-06-23 MED ORDER — OLANZAPINE 5 MG PO TABS: ORAL_TABLET | ORAL | 0 refills | Status: DC

## 2020-06-23 MED ORDER — OLANZAPINE 5 MG PO TABS: 5.0000 mg | ORAL_TABLET | Freq: Every morning | ORAL | 0 refills | Status: DC

## 2020-06-23 MED ORDER — DIVALPROEX SODIUM 125 MG PO CSDR: 500.0000 mg | DELAYED_RELEASE_CAPSULE | Freq: Three times a day (TID) | ORAL | 0 refills | Status: DC

## 2020-06-23 MED ORDER — OLANZAPINE 10 MG PO TABS: 10.0000 mg | ORAL_TABLET | Freq: Every day | ORAL | 0 refills | Status: DC

## 2020-06-23 MED ORDER — DIVALPROEX SODIUM 500 MG PO DR TAB: 500.0000 mg | DELAYED_RELEASE_TABLET | Freq: Three times a day (TID) | ORAL | 0 refills | Status: DC

## 2020-06-23 MED FILL — METHYLPHENIDATE 20 MG TAB: 20 | 30 days supply | Qty: 60 | Fill #0

## 2020-06-23 MED FILL — MELATONIN 3 MG TABS: 3 | 30 days supply | Qty: 30 | Fill #0

## 2020-06-23 MED FILL — METHOCARBAMOL 500 MG TABS: 500 | 15 days supply | Qty: 60 | Fill #0

## 2020-06-23 MED FILL — FAMOTIDINE 20 MG TABS: 20 | 30 days supply | Qty: 60 | Fill #0

## 2020-06-23 MED FILL — OLANZapine 5 MG TABS: 5 | 30 days supply | Qty: 30 | Fill #0

## 2020-06-23 MED FILL — LORazepam 0.5 MG TABS: 0.5 | 5 days supply | Qty: 10 | Fill #0
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
## 2020-06-23 NOTE — Progress Notes (Signed)
Occupational Therapy Session Note  Patient Details  Name: Adam Bates MRN: [id number] Date of Birth: Feb 24, 1971  Today's Date: 06/23/2020 OT Individual Time: 6073-7106 OT Individual Time Calculation (min): 60 min    Short Term Goals: Week 6:  OT Short Term Goal 1 (Week 6): STG=LTG d/t ELOS  Skilled Therapeutic Interventions/Progress Updates:    pt received sitting in recliner with no c/o pain. Pt eager for OT session, agreeable to start with shower. Ambulatory transfer to the shower with mod I using RW. Supervision to doff all clothing and to transfer into walk in shower. He completed all bathing with only min cueing for thoroughness, but supervision overall. Pt alert and oriented x4 and improvement in functional language, despite being quite verbose this morning. Pt donned LB clothing with supervision, mod I for UB. Pt completed 200 ft of functional mobility to the ADL apt. He completed tub transfer with supervision and min cueing for technique, requiring seated rest break following d/t fatigue. Remainder of session with focus on cognitive remediation. Intervention aimed at challenging selective attention, sequencing, and functional language. He required min cueing for naming of functional objects. He was often able to describe item before coming up with name spontaneously. He was 75% accurate in reading names of objects from package. Good improvement in sequencing and functional sorting of items into two categories. Pt completed 1 activity on the BITS, aimed at challenging working memory, sequencing, and selective attention as he completed activity in gym with another pt in close proximity. He scored only 53% with a 3 item sequence, struggling with attention to task. Pt returned to his room and was left sitting up with all needs met, chair alarm set.  Therapy Documentation Precautions:  Precautions Precautions: Fall Precaution Comments: no longer has PEG Required Braces or Orthoses: Other  Brace Other Brace: prevalon boot  for wound on pt's R heel Restrictions Weight Bearing Restrictions: Yes RLE Weight Bearing: Weight bearing as tolerated Other Position/Activity Restrictions: may progress to WBAT per ortho 05/21/20 Therapy/Group: Individual Therapy  Curtis Sites 06/23/2020, 7:53 AM

## 2020-06-23 NOTE — Progress Notes (Signed)
Inpatient Rehabilitation Care Coordinator Discharge Note  The overall goal for the admission was met for:   Discharge location: Yes. D/c to home with 24/7 care with wife.   Length of Stay: Yes. 45 days.   Discharge activity level: Yes. Supervision.  Home/community participation: Yes. Limited.   Services provided included: MD, RD, PT, OT, SLP, RN, CM, TR, Pharmacy, Neuropsych and SW  Financial Services: Private Insurance: Christella Scheuermann  Choices offered to/list presented to:Yes  Follow-up services arranged: Outpatient: [facility] for Outpatient PT/OT/SLP and DME: [facility] for Rolling walker  Comments (or additional information):  Patient/Family verbalized understanding of follow-up arrangements: Yes  Individual responsible for coordination of the follow-up plan: Contact pt wife [name] # [phone]  Confirmed correct DME delivered: Rana Snare 06/23/2020    Rana Snare

## 2020-06-23 NOTE — Progress Notes (Signed)
Physical Therapy Discharge Summary  Patient Details  Name: Adam Bates MRN: [id number] Date of Birth: Oct 14, 1970  Today's Date: 06/23/2020 PT Individual Time: 1030-1150 PT Individual Time Calculation (min): 80 min    Patient has met 9 of 10 long term goals due to improved activity tolerance, improved balance, improved postural control, increased strength, increased range of motion, decreased pain, ability to compensate for deficits, improved attention, improved awareness and improved coordination.  Patient to discharge at an ambulatory level Supervision.   Patient's care partner is independent to provide the necessary physical and cognitive assistance at discharge.  Reasons goals not met: Patient is a functional ambulator using a RW and will not use a w/c at d/c.  Recommendation:  Patient will benefit from ongoing skilled PT services in outpatient setting to continue to advance safe functional mobility, address ongoing impairments in balance, R LE strength/ROM, gait and stair training, cognitive remediation, community reintegration, safety awareness, and minimize fall risk.  Equipment: RW  Reasons for discharge: treatment goals met  Patient/family agrees with progress made and goals achieved: Yes  Skilled Therapeutic Interventions:  Patient in recliner in the room upon PT arrival. Patient alert and agreeable to PT session. Patient reported 7-8/10 R lower extremity pain during session, RN made aware. PT provided repositioning, rest breaks, and distraction as pain interventions throughout session.   Patient was alert and oriented x4 with 2 cues for word finding.   Therapeutic Activity: Bed Mobility: Patient performed rolling R/L and supine to/from sit independently during exercises in a flat bed without use of bedrails.  Transfers: Patient performed sit to/from stand with mod I using RW throughout session, from recliner, arm chair, and low bed. Patient demonstrated safe use of RW  without cues each trial.  Gait Training:  Patient ambulated 260 feet in 3 min 4 sec in attempted Six Minute Walk Test using RW with supervision. Patient required a seated rest break, ending the test due to increased R lower extremity pain, 9/10. Ambulated as described below. Provided verbal cues for erect posture and visual scanning to the R. Patient ascended/descended 16 steps using R rail 2x4 and L rail 2x4 with B upper extremity support on 1 rail to simulate home set-up with CGA for safety and teach back method for technique with 100% recall from patient. Performed step-to gait pattern leading with L while ascending and R while descending.  Neuromuscular Re-ed: Patient performed the following activities: Provided HEP during session, patient performed 1 set of each: Access Code: [email or web address] Supine Bridge - 2 x daily - 7 x weekly - 2 sets - 5 reps Prone Knee Flexion - 2 x daily - 7 x weekly - 2 sets - 10 reps Squat with Chair Touch - 2 x daily - 7 x weekly - 2 sets - 5 reps Side Stepping with Counter Support - 2 x daily - 7 x weekly - 1 sets - 5 reps  Patient performed the [facility] Balance Test, see details under balance, below. Patient demonstrates increased fall risk as noted by score of 42/56 on Berg Balance Scale. (<36= high risk for falls, close to 100%; 37-45 significant >80%; 46-51 moderate >50%; 52-55 lower >25%)  Discussed safety at home, home set-up, recommending staying downstairs initially for safety, and OPPT goals to discuss on evaluation. Patient very receptive to education and eager/excited for d/c home tomorrow.   Patient in recliner at end of session with breaks locked, chair alarm set, and all needs within reach.   PT Discharge Precautions/Restrictions  Precautions Precautions: Fall Restrictions Weight Bearing Restrictions: Yes RLE Weight Bearing: Weight bearing as tolerated Vision/Perception  Vision - Assessment Eye Alignment: Within Functional Limits Alignment/Gaze  Preference: Within Defined Limits Tracking/Visual Pursuits: Able to track stimulus in all quads without difficulty Saccades: Within functional limits Convergence: Within functional limits Perception Perception: Impaired Inattention/Neglect: Does not attend to right visual field (R inattention much improved during admission) Praxis Praxis Impairment Details: Perseveration  Cognition Overall Cognitive Status: Impaired/Different from baseline Arousal/Alertness: Awake/alert Orientation Level: Oriented X4 (able to use strategies such as looking at a calendar for the date) Focused Attention: Impaired Sustained Attention: Impaired Sustained Attention Impairment: Functional complex;Verbal complex Memory: Impaired Memory Impairment: Decreased recall of new information Awareness: Impaired Awareness Impairment: Emergent impairment Problem Solving: Impaired Problem Solving Impairment: Verbal complex;Functional complex Behaviors: Restless;Poor frustration tolerance;Perseveration Safety/Judgment: Impaired [employer] Scales of Cognitive Functioning: Confused/appropriate (Rancho VI emerging VII) Sensation Sensation Light Touch: Appears Intact Proprioception: Appears Intact Coordination Gross Motor Movements are Fluid and Coordinated: No Fine Motor Movements are Fluid and Coordinated: No Coordination and Movement Description: decreased R LE ROM due to multiple fractures Motor  Motor Motor: Other (comment) Motor - Discharge Observations: Requires use of RW due to R LE pain, continues to have limited R LE ROM, and decreased balance due to R LE and visual deficits  Mobility Bed Mobility Rolling Right: Independent with assistive device Rolling Left: Independent with assistive device Right Sidelying to Sit: Independent with assistive device Supine to Sit: Independent with assistive device Sit to Supine: Independent with assistive device Transfers Sit to Stand: Independent with assistive  device Stand to Sit: Independent with assistive device Stand Pivot Transfers: Supervision/Verbal cueing Stand Pivot Transfer Details: Verbal cues for safe use of DME/AE Transfer (Assistive device): Rolling walker Locomotion  Gait Ambulation: Yes Gait Assistance: Supervision/Verbal cueing Assistive device: Rolling walker Gait Gait: Yes Gait Pattern: Decreased weight shift to right;Decreased hip/knee flexion - right;Antalgic;Decreased trunk rotation Gait velocity: decreased Stairs / Additional Locomotion Stairs: Yes Stairs Assistance: Contact Guard/Touching assist;Supervision/Verbal cueing Stair Management Technique: One rail Right;One rail Left (has to alternate rails on steps at home; using B UE support on 1 rail) Number of Stairs: 16 Height of Stairs: 6 Ramp: Supervision/Verbal cueing Curb: Supervision/Verbal cueing Wheelchair Mobility 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, petroleum)  Trunk/Postural Assessment  Cervical Assessment Cervical Assessment: Within Functional Limits Thoracic Assessment Thoracic Assessment: Within Functional Limits Lumbar Assessment Lumbar Assessment: Within Functional Limits Postural Control Postural Control: Deficits on evaluation Postural Limitations: improved since eval  Balance Standardized Balance Assessment Standardized Balance Assessment: Berg Balance Test Berg Balance Test Sit to Stand: Able to stand  independently using hands Standing Unsupported: Able to stand safely 2 minutes Sitting with Back Unsupported but Feet Supported on Floor or Stool: Able to sit safely and securely 2 minutes Stand to Sit: Sits safely with minimal use of hands Transfers: Able to transfer safely, definite need of hands Standing Unsupported with Eyes Closed: Able to stand 10 seconds safely Standing Ubsupported with Feet Together: Able to place feet together independently and stand 1 minute safely From Standing, Reach Forward with Outstretched Arm:  Can reach forward >12 cm safely (5") From Standing Position, Pick up Object from Floor: Able to pick up shoe, needs supervision From Standing Position, Turn to Look Behind Over each Shoulder: Looks behind one side only/other side shows less weight shift (looks behind to the L) Turn 360 Degrees: Needs assistance while turning Standing Unsupported, Alternately Place Feet on Step/Stool: Needs assistance to keep  from falling or unable to try (requires upper extremity support for standing on R leg) Standing Unsupported, One Foot in Front: Able to plae foot ahead of the other independently and hold 30 seconds (R foot ahead) Standing on One Leg: Able to lift leg independently and hold > 10 seconds Total Score: 42 Static Sitting Balance Static Sitting - Balance Support: No upper extremity supported Static Sitting - Level of Assistance: 7: Independent Dynamic Sitting Balance Dynamic Sitting - Balance Support: No upper extremity supported;During functional activity Dynamic Sitting - Level of Assistance: 7: Independent Static Standing Balance Static Standing - Balance Support: No upper extremity supported Static Standing - Level of Assistance: 7: Independent Dynamic Standing Balance Dynamic Standing - Balance Support: No upper extremity supported;During functional activity Dynamic Standing - Level of Assistance: 6: Modified independent (Device/Increase time) Extremity Assessment  RLE Assessment RLE Assessment: Exceptions to [facility] Passive Range of Motion (PROM) Comments: knee flexion 99 deg Active Range of Motion (AROM) Comments: in supine hip flexion 102 deg, knee flexion 93 deg, otherwise [facility] General Strength Comments: Grossly in sitting: 3+/5 for hip flexion and knee flexion/extension, 5/5 DF/PF LLE Assessment LLE Assessment: Within Functional Limits Active Range of Motion (AROM) Comments: WFL with functional mobility General Strength Comments: Grossly in sitting: 5/5 throughout    Aulton Routt L  Finnis Colee PT, DPT  06/23/2020, 4:07 PM

## 2020-06-24 MED FILL — OLANZapine 10 MG TABS: 10 | 30 days supply | Qty: 30 | Fill #0

## 2020-06-24 MED FILL — CITALOPRAM HBR 10 MG TABLET: 10 | 30 days supply | Qty: 90 | Fill #0

## 2020-06-24 MED FILL — DIVALPROEX SOD DR 500 MG TA: 500 | 30 days supply | Qty: 90 | Fill #0
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
## 2020-06-26 ENCOUNTER — Telehealth: Payer: Self-pay
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
## 2020-06-27 ENCOUNTER — Other Ambulatory Visit: Payer: Self-pay

## 2020-06-27 ENCOUNTER — Ambulatory Visit: Payer: Managed Care, Other (non HMO) | Admitting: Occupational Therapy

## 2020-06-27 ENCOUNTER — Ambulatory Visit: Payer: Managed Care, Other (non HMO) | Admitting: Speech Pathology

## 2020-06-27 ENCOUNTER — Ambulatory Visit: Payer: Managed Care, Other (non HMO) | Attending: Physician Assistant

## 2020-06-27 ENCOUNTER — Encounter: Payer: Self-pay | Admitting: Speech Pathology

## 2020-06-27 ENCOUNTER — Telehealth: Payer: Self-pay | Admitting: Registered Nurse

## 2020-06-27 DIAGNOSIS — H53461 Homonymous bilateral field defects, right side: Secondary | ICD-10-CM | POA: Diagnosis not present

## 2020-06-27 DIAGNOSIS — R600 Localized edema: Secondary | ICD-10-CM | POA: Insufficient documentation

## 2020-06-27 DIAGNOSIS — R41840 Attention and concentration deficit: Secondary | ICD-10-CM | POA: Diagnosis not present

## 2020-06-27 DIAGNOSIS — M545 Low back pain, unspecified: Secondary | ICD-10-CM | POA: Insufficient documentation

## 2020-06-27 DIAGNOSIS — R29898 Other symptoms and signs involving the musculoskeletal system: Secondary | ICD-10-CM | POA: Insufficient documentation

## 2020-06-27 DIAGNOSIS — R4701 Aphasia: Secondary | ICD-10-CM

## 2020-06-27 DIAGNOSIS — R41841 Cognitive communication deficit: Secondary | ICD-10-CM

## 2020-06-27 DIAGNOSIS — R2689 Other abnormalities of gait and mobility: Secondary | ICD-10-CM

## 2020-06-27 DIAGNOSIS — M25561 Pain in right knee: Secondary | ICD-10-CM | POA: Insufficient documentation

## 2020-06-27 DIAGNOSIS — M6281 Muscle weakness (generalized): Secondary | ICD-10-CM

## 2020-06-27 DIAGNOSIS — S069X0A Unspecified intracranial injury without loss of consciousness, initial encounter: Secondary | ICD-10-CM | POA: Diagnosis present

## 2020-06-27 DIAGNOSIS — S069X0S Unspecified intracranial injury without loss of consciousness, sequela: Secondary | ICD-10-CM | POA: Insufficient documentation

## 2020-06-27 DIAGNOSIS — W19XXXS Unspecified fall, sequela: Secondary | ICD-10-CM | POA: Diagnosis not present

## 2020-06-27 DIAGNOSIS — M25661 Stiffness of right knee, not elsewhere classified: Secondary | ICD-10-CM

## 2020-06-27 DIAGNOSIS — M62838 Other muscle spasm: Secondary | ICD-10-CM | POA: Insufficient documentation
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
## 2020-06-27 NOTE — Telephone Encounter (Signed)
After speaking with Olin Hauser, she spoke with nursing staff. They beleve the dressing on his right foot was for padding , he was walking on the rehab unit with bare foot. This was relayed to Ms. Lorelle Formosa, she verbalizes understanding. Ms. Gruszka will leave dressing intact, he has a scheduled appointment with this provider on 07/02/2020, she verbalizes understanding.
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
## 2020-06-27 NOTE — Telephone Encounter (Signed)
Return Mrs. Chsholm call, she was asking about dressing change of his right heel. No documentation noted. Sent a message to Reesa Chew Pa-C for assistance. I will speak with Dr Naaman Plummer regarding her concern when he should have a  dental appointment . She realizes Dr Naaman Plummer is on vacation and verbalizes understanding.
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
## 2020-06-30 ENCOUNTER — Other Ambulatory Visit: Payer: Self-pay

## 2020-06-30 ENCOUNTER — Encounter: Payer: Self-pay | Admitting: Speech Pathology

## 2020-06-30 ENCOUNTER — Encounter: Payer: Self-pay | Admitting: Occupational Therapy

## 2020-06-30 ENCOUNTER — Ambulatory Visit: Payer: Managed Care, Other (non HMO) | Admitting: Speech Pathology

## 2020-06-30 ENCOUNTER — Ambulatory Visit: Payer: Managed Care, Other (non HMO)

## 2020-06-30 ENCOUNTER — Ambulatory Visit: Payer: Managed Care, Other (non HMO) | Admitting: Occupational Therapy

## 2020-06-30 DIAGNOSIS — R29898 Other symptoms and signs involving the musculoskeletal system: Secondary | ICD-10-CM

## 2020-06-30 DIAGNOSIS — M25561 Pain in right knee: Secondary | ICD-10-CM

## 2020-06-30 DIAGNOSIS — M62838 Other muscle spasm: Secondary | ICD-10-CM

## 2020-06-30 DIAGNOSIS — R41840 Attention and concentration deficit: Secondary | ICD-10-CM

## 2020-06-30 DIAGNOSIS — W19XXXS Unspecified fall, sequela: Secondary | ICD-10-CM

## 2020-06-30 DIAGNOSIS — R600 Localized edema: Secondary | ICD-10-CM

## 2020-06-30 DIAGNOSIS — M6281 Muscle weakness (generalized): Secondary | ICD-10-CM

## 2020-06-30 DIAGNOSIS — M25661 Stiffness of right knee, not elsewhere classified: Secondary | ICD-10-CM

## 2020-06-30 DIAGNOSIS — R41841 Cognitive communication deficit: Secondary | ICD-10-CM

## 2020-06-30 DIAGNOSIS — S069X0A Unspecified intracranial injury without loss of consciousness, initial encounter: Secondary | ICD-10-CM

## 2020-06-30 DIAGNOSIS — R2689 Other abnormalities of gait and mobility: Secondary | ICD-10-CM

## 2020-06-30 DIAGNOSIS — H53461 Homonymous bilateral field defects, right side: Secondary | ICD-10-CM

## 2020-06-30 DIAGNOSIS — S069X0S Unspecified intracranial injury without loss of consciousness, sequela: Secondary | ICD-10-CM | POA: Diagnosis not present

## 2020-06-30 DIAGNOSIS — M545 Low back pain, unspecified: Secondary | ICD-10-CM
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
## 2020-06-30 NOTE — Patient Instructions (Signed)
Adam Bates to write a typical schedule for patient to decrease anxiety and increase independence.
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
## 2020-07-02 ENCOUNTER — Encounter: Payer: Self-pay | Admitting: Speech Pathology

## 2020-07-02 ENCOUNTER — Encounter: Payer: Managed Care, Other (non HMO) | Attending: Registered Nurse | Admitting: Registered Nurse

## 2020-07-02 ENCOUNTER — Other Ambulatory Visit: Payer: Self-pay

## 2020-07-02 ENCOUNTER — Ambulatory Visit: Payer: Managed Care, Other (non HMO) | Admitting: Speech Pathology

## 2020-07-02 ENCOUNTER — Encounter: Payer: Self-pay | Admitting: Registered Nurse

## 2020-07-02 DIAGNOSIS — L97419 Non-pressure chronic ulcer of right heel and midfoot with unspecified severity: Secondary | ICD-10-CM | POA: Insufficient documentation

## 2020-07-02 DIAGNOSIS — R4701 Aphasia: Secondary | ICD-10-CM

## 2020-07-02 DIAGNOSIS — R41841 Cognitive communication deficit: Secondary | ICD-10-CM | POA: Insufficient documentation

## 2020-07-02 DIAGNOSIS — S022XXD Fracture of nasal bones, subsequent encounter for fracture with routine healing: Secondary | ICD-10-CM | POA: Insufficient documentation

## 2020-07-02 DIAGNOSIS — S069X0D Unspecified intracranial injury without loss of consciousness, subsequent encounter: Secondary | ICD-10-CM | POA: Insufficient documentation

## 2020-07-02 DIAGNOSIS — S82401B Unspecified fracture of shaft of right fibula, initial encounter for open fracture type I or II: Secondary | ICD-10-CM | POA: Diagnosis not present

## 2020-07-02 DIAGNOSIS — S82401E Unspecified fracture of shaft of right fibula, subsequent encounter for open fracture type I or II with routine healing: Secondary | ICD-10-CM | POA: Insufficient documentation

## 2020-07-02 DIAGNOSIS — M545 Low back pain, unspecified: Secondary | ICD-10-CM | POA: Insufficient documentation

## 2020-07-02 DIAGNOSIS — S82201B Unspecified fracture of shaft of right tibia, initial encounter for open fracture type I or II: Secondary | ICD-10-CM | POA: Diagnosis not present

## 2020-07-02 DIAGNOSIS — T148XXA Other injury of unspecified body region, initial encounter: Secondary | ICD-10-CM | POA: Diagnosis not present

## 2020-07-02 DIAGNOSIS — S069X0S Unspecified intracranial injury without loss of consciousness, sequela: Secondary | ICD-10-CM | POA: Diagnosis not present

## 2020-07-02 DIAGNOSIS — M25661 Stiffness of right knee, not elsewhere classified: Secondary | ICD-10-CM | POA: Insufficient documentation

## 2020-07-02 DIAGNOSIS — R29898 Other symptoms and signs involving the musculoskeletal system: Secondary | ICD-10-CM | POA: Insufficient documentation

## 2020-07-02 DIAGNOSIS — M25561 Pain in right knee: Secondary | ICD-10-CM | POA: Insufficient documentation

## 2020-07-02 DIAGNOSIS — R2689 Other abnormalities of gait and mobility: Secondary | ICD-10-CM | POA: Insufficient documentation

## 2020-07-02 DIAGNOSIS — M62838 Other muscle spasm: Secondary | ICD-10-CM | POA: Insufficient documentation

## 2020-07-02 DIAGNOSIS — M6281 Muscle weakness (generalized): Secondary | ICD-10-CM | POA: Insufficient documentation

## 2020-07-02 DIAGNOSIS — H53461 Homonymous bilateral field defects, right side: Secondary | ICD-10-CM | POA: Insufficient documentation

## 2020-07-02 DIAGNOSIS — R41840 Attention and concentration deficit: Secondary | ICD-10-CM | POA: Insufficient documentation

## 2020-07-02 MED ORDER — METHOCARBAMOL 500 MG PO TABS
500.0000 mg | ORAL_TABLET | Freq: Four times a day (QID) | ORAL | 0 refills | Status: DC | PRN
Start: 2020-07-02 — End: 2020-07-24

## 2020-07-02 MED ORDER — DIVALPROEX SODIUM 500 MG PO DR TAB: 500.0000 mg | DELAYED_RELEASE_TABLET | Freq: Three times a day (TID) | ORAL | 0 refills | Status: DC

## 2020-07-02 MED ORDER — SANTYL 250 UNIT/GM EX OINT: 1.0000 "application " | TOPICAL_OINTMENT | Freq: Every day | CUTANEOUS | 0 refills | Status: DC

## 2020-07-02 MED ORDER — METHYLPHENIDATE HCL 10 MG PO TABS: 20.0000 mg | ORAL_TABLET | Freq: Two times a day (BID) | ORAL | 0 refills | Status: DC

## 2020-07-02 MED ORDER — OLANZAPINE 5 MG PO TABS: 5.0000 mg | ORAL_TABLET | Freq: Every morning | ORAL | 0 refills | Status: DC

## 2020-07-02 MED ORDER — OLANZAPINE 10 MG PO TABS: 10.0000 mg | ORAL_TABLET | Freq: Every day | ORAL | 0 refills | Status: DC

## 2020-07-02 MED ORDER — OXYCODONE HCL 5 MG PO TABS: 5.0000 mg | ORAL_TABLET | Freq: Two times a day (BID) | ORAL | 0 refills | Status: DC | PRN

## 2020-07-02 MED ORDER — CITALOPRAM HYDROBROMIDE 10 MG PO TABS: 30.0000 mg | ORAL_TABLET | Freq: Every day | ORAL | 0 refills | Status: DC
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
## 2020-07-02 NOTE — Patient Instructions (Signed)
Work on [employer] throughout the house.
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
## 2020-07-04 ENCOUNTER — Encounter: Payer: Self-pay | Admitting: Physical Therapy

## 2020-07-04 ENCOUNTER — Encounter: Payer: Self-pay | Admitting: Occupational Therapy

## 2020-07-04 ENCOUNTER — Ambulatory Visit: Payer: Managed Care, Other (non HMO) | Admitting: Occupational Therapy

## 2020-07-04 ENCOUNTER — Encounter: Payer: Self-pay | Admitting: Speech Pathology

## 2020-07-04 ENCOUNTER — Ambulatory Visit: Payer: Managed Care, Other (non HMO) | Admitting: Physical Therapy

## 2020-07-04 ENCOUNTER — Other Ambulatory Visit: Payer: Self-pay

## 2020-07-04 ENCOUNTER — Ambulatory Visit: Payer: Managed Care, Other (non HMO) | Admitting: Speech Pathology

## 2020-07-04 DIAGNOSIS — R29898 Other symptoms and signs involving the musculoskeletal system: Secondary | ICD-10-CM

## 2020-07-04 DIAGNOSIS — H53461 Homonymous bilateral field defects, right side: Secondary | ICD-10-CM

## 2020-07-04 DIAGNOSIS — R4701 Aphasia: Secondary | ICD-10-CM

## 2020-07-04 DIAGNOSIS — R2689 Other abnormalities of gait and mobility: Secondary | ICD-10-CM

## 2020-07-04 DIAGNOSIS — R41841 Cognitive communication deficit: Secondary | ICD-10-CM | POA: Diagnosis not present

## 2020-07-04 DIAGNOSIS — M6281 Muscle weakness (generalized): Secondary | ICD-10-CM

## 2020-07-04 DIAGNOSIS — R41840 Attention and concentration deficit: Secondary | ICD-10-CM

## 2020-07-04 DIAGNOSIS — M25561 Pain in right knee: Secondary | ICD-10-CM

## 2020-07-04 DIAGNOSIS — M25661 Stiffness of right knee, not elsewhere classified: Secondary | ICD-10-CM
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
## 2020-07-04 NOTE — Patient Instructions (Signed)
FLEXION: Sitting - Resistance Band (Active)     Sit with arm relaxed down and resistance band anchored under your foot. Against the band, lift arm forward and up above shoulder height, keeping elbow straight Complete with each arm Complete __1__ sets of __15__ repetitions. Perform __2-3__ sessions per day   ABDUCTION: Sitting - Resistance Band (Active)     Sit with arm relaxed down and resistance band anchored under your foot. Against the band, lift arm out to side to approximately shoulder level, keeping elbow straight Complete with each arm Complete __1__ sets of __15__ repetitions. Perform __2-3__ sessions per day   Resisted External Rotation: in Neutral - Bilateral    With resistance band in both hands or tied in a circle and positioned around wrists, pinch shoulder blades together and rotate forearms out. Keep elbows at sides and hands at about the same level as your shoulders. Complete __1__ sets of __15__ repetitions. Perform __2-3__ sessions per day

## 2020-07-07 ENCOUNTER — Ambulatory Visit: Payer: Managed Care, Other (non HMO) | Admitting: Occupational Therapy

## 2020-07-07 ENCOUNTER — Ambulatory Visit: Payer: Managed Care, Other (non HMO) | Admitting: Speech Pathology

## 2020-07-07 ENCOUNTER — Encounter: Payer: Self-pay | Admitting: Speech Pathology

## 2020-07-07 ENCOUNTER — Ambulatory Visit: Payer: Managed Care, Other (non HMO)

## 2020-07-07 ENCOUNTER — Other Ambulatory Visit: Payer: Self-pay

## 2020-07-07 DIAGNOSIS — R4701 Aphasia: Secondary | ICD-10-CM | POA: Diagnosis not present

## 2020-07-07 DIAGNOSIS — M25561 Pain in right knee: Secondary | ICD-10-CM

## 2020-07-07 DIAGNOSIS — M545 Low back pain, unspecified: Secondary | ICD-10-CM

## 2020-07-07 DIAGNOSIS — M25661 Stiffness of right knee, not elsewhere classified: Secondary | ICD-10-CM

## 2020-07-07 DIAGNOSIS — M6281 Muscle weakness (generalized): Secondary | ICD-10-CM

## 2020-07-07 DIAGNOSIS — R2689 Other abnormalities of gait and mobility: Secondary | ICD-10-CM

## 2020-07-07 DIAGNOSIS — R29898 Other symptoms and signs involving the musculoskeletal system: Secondary | ICD-10-CM

## 2020-07-07 DIAGNOSIS — M62838 Other muscle spasm: Secondary | ICD-10-CM

## 2020-07-07 DIAGNOSIS — H53461 Homonymous bilateral field defects, right side: Secondary | ICD-10-CM

## 2020-07-07 DIAGNOSIS — R41841 Cognitive communication deficit: Secondary | ICD-10-CM | POA: Diagnosis not present

## 2020-07-07 DIAGNOSIS — R41840 Attention and concentration deficit: Secondary | ICD-10-CM
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
## 2020-07-07 NOTE — Patient Instructions (Addendum)
Homework:  1. Please tell me one thing that you feel like you are struggling with at home.  2. Write it in your memory book.   3. Bring memory book with you next session.   Dominica - Astronomer
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
## 2020-07-08 ENCOUNTER — Encounter: Payer: Self-pay | Admitting: Registered Nurse

## 2020-07-09 ENCOUNTER — Encounter: Payer: Self-pay | Admitting: Speech Pathology

## 2020-07-09 ENCOUNTER — Ambulatory Visit: Payer: Managed Care, Other (non HMO) | Admitting: Speech Pathology

## 2020-07-09 ENCOUNTER — Other Ambulatory Visit: Payer: Self-pay

## 2020-07-09 DIAGNOSIS — R41841 Cognitive communication deficit: Secondary | ICD-10-CM | POA: Diagnosis not present

## 2020-07-09 DIAGNOSIS — R4701 Aphasia: Secondary | ICD-10-CM | POA: Diagnosis not present

## 2020-07-09 NOTE — Patient Instructions (Signed)
Given "Relaxation with Brain Injury" handout. Choose at least one strategy that you can use throughout the day to promote relaxation.   Please bring your memory book next session.
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
## 2020-07-11 ENCOUNTER — Ambulatory Visit: Payer: Managed Care, Other (non HMO) | Admitting: Occupational Therapy

## 2020-07-11 ENCOUNTER — Other Ambulatory Visit: Payer: Self-pay

## 2020-07-11 ENCOUNTER — Ambulatory Visit: Payer: Managed Care, Other (non HMO) | Admitting: Speech Pathology

## 2020-07-11 ENCOUNTER — Ambulatory Visit: Payer: Managed Care, Other (non HMO)

## 2020-07-11 ENCOUNTER — Encounter: Payer: Self-pay | Admitting: Speech Pathology

## 2020-07-11 DIAGNOSIS — M25661 Stiffness of right knee, not elsewhere classified: Secondary | ICD-10-CM

## 2020-07-11 DIAGNOSIS — R29898 Other symptoms and signs involving the musculoskeletal system: Secondary | ICD-10-CM

## 2020-07-11 DIAGNOSIS — M25561 Pain in right knee: Secondary | ICD-10-CM

## 2020-07-11 DIAGNOSIS — M6281 Muscle weakness (generalized): Secondary | ICD-10-CM

## 2020-07-11 DIAGNOSIS — H53461 Homonymous bilateral field defects, right side: Secondary | ICD-10-CM

## 2020-07-11 DIAGNOSIS — R41840 Attention and concentration deficit: Secondary | ICD-10-CM

## 2020-07-11 DIAGNOSIS — M545 Low back pain, unspecified: Secondary | ICD-10-CM

## 2020-07-11 DIAGNOSIS — R4701 Aphasia: Secondary | ICD-10-CM | POA: Diagnosis not present

## 2020-07-11 DIAGNOSIS — R2689 Other abnormalities of gait and mobility: Secondary | ICD-10-CM

## 2020-07-11 DIAGNOSIS — M62838 Other muscle spasm: Secondary | ICD-10-CM

## 2020-07-11 DIAGNOSIS — R41841 Cognitive communication deficit: Secondary | ICD-10-CM | POA: Diagnosis not present

## 2020-07-11 NOTE — Patient Instructions (Signed)
Practice your breathing exercises.   Finish today's "memory" entry in your book.
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
## 2020-07-14 ENCOUNTER — Other Ambulatory Visit: Payer: Self-pay

## 2020-07-14 ENCOUNTER — Ambulatory Visit: Payer: Managed Care, Other (non HMO) | Admitting: Occupational Therapy

## 2020-07-14 ENCOUNTER — Ambulatory Visit: Payer: Managed Care, Other (non HMO) | Admitting: Speech Pathology

## 2020-07-14 ENCOUNTER — Encounter: Payer: Self-pay | Admitting: Speech Pathology

## 2020-07-14 ENCOUNTER — Encounter: Payer: Self-pay | Admitting: Occupational Therapy

## 2020-07-14 ENCOUNTER — Ambulatory Visit: Payer: Managed Care, Other (non HMO)

## 2020-07-14 DIAGNOSIS — M25561 Pain in right knee: Secondary | ICD-10-CM

## 2020-07-14 DIAGNOSIS — M545 Low back pain, unspecified: Secondary | ICD-10-CM

## 2020-07-14 DIAGNOSIS — R2689 Other abnormalities of gait and mobility: Secondary | ICD-10-CM

## 2020-07-14 DIAGNOSIS — R41841 Cognitive communication deficit: Secondary | ICD-10-CM | POA: Diagnosis not present

## 2020-07-14 DIAGNOSIS — M6281 Muscle weakness (generalized): Secondary | ICD-10-CM

## 2020-07-14 DIAGNOSIS — R4701 Aphasia: Secondary | ICD-10-CM

## 2020-07-14 DIAGNOSIS — R29898 Other symptoms and signs involving the musculoskeletal system: Secondary | ICD-10-CM

## 2020-07-14 DIAGNOSIS — M62838 Other muscle spasm: Secondary | ICD-10-CM

## 2020-07-14 DIAGNOSIS — R41840 Attention and concentration deficit: Secondary | ICD-10-CM

## 2020-07-14 DIAGNOSIS — M25661 Stiffness of right knee, not elsewhere classified: Secondary | ICD-10-CM

## 2020-07-14 DIAGNOSIS — H53461 Homonymous bilateral field defects, right side: Secondary | ICD-10-CM
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
## 2020-07-16 ENCOUNTER — Ambulatory Visit: Payer: Managed Care, Other (non HMO)

## 2020-07-16 ENCOUNTER — Ambulatory Visit: Payer: Managed Care, Other (non HMO) | Admitting: Speech Pathology

## 2020-07-16 ENCOUNTER — Other Ambulatory Visit: Payer: Self-pay

## 2020-07-16 ENCOUNTER — Encounter: Payer: Self-pay | Admitting: Speech Pathology

## 2020-07-16 ENCOUNTER — Ambulatory Visit: Payer: Managed Care, Other (non HMO) | Admitting: Occupational Therapy

## 2020-07-16 ENCOUNTER — Encounter: Payer: Self-pay | Admitting: Occupational Therapy

## 2020-07-16 DIAGNOSIS — M25561 Pain in right knee: Secondary | ICD-10-CM

## 2020-07-16 DIAGNOSIS — R4701 Aphasia: Secondary | ICD-10-CM | POA: Diagnosis not present

## 2020-07-16 DIAGNOSIS — R41841 Cognitive communication deficit: Secondary | ICD-10-CM

## 2020-07-16 DIAGNOSIS — R2689 Other abnormalities of gait and mobility: Secondary | ICD-10-CM

## 2020-07-16 DIAGNOSIS — M6281 Muscle weakness (generalized): Secondary | ICD-10-CM

## 2020-07-16 DIAGNOSIS — R29898 Other symptoms and signs involving the musculoskeletal system: Secondary | ICD-10-CM

## 2020-07-16 DIAGNOSIS — M25661 Stiffness of right knee, not elsewhere classified: Secondary | ICD-10-CM

## 2020-07-16 DIAGNOSIS — M62838 Other muscle spasm: Secondary | ICD-10-CM

## 2020-07-16 DIAGNOSIS — M545 Low back pain, unspecified: Secondary | ICD-10-CM

## 2020-07-16 DIAGNOSIS — R41840 Attention and concentration deficit: Secondary | ICD-10-CM

## 2020-07-16 DIAGNOSIS — H53461 Homonymous bilateral field defects, right side: Secondary | ICD-10-CM
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
## 2020-07-17 ENCOUNTER — Encounter: Payer: Self-pay | Admitting: Speech Pathology

## 2020-07-17 ENCOUNTER — Ambulatory Visit: Payer: Managed Care, Other (non HMO) | Admitting: Speech Pathology

## 2020-07-17 DIAGNOSIS — R4701 Aphasia: Secondary | ICD-10-CM

## 2020-07-17 DIAGNOSIS — R41841 Cognitive communication deficit: Secondary | ICD-10-CM

## 2020-07-17 NOTE — Patient Instructions (Signed)
Continue with current plan
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
## 2020-07-21 ENCOUNTER — Ambulatory Visit: Payer: Managed Care, Other (non HMO)

## 2020-07-21 ENCOUNTER — Ambulatory Visit: Payer: Managed Care, Other (non HMO) | Admitting: Occupational Therapy

## 2020-07-21 ENCOUNTER — Ambulatory Visit: Payer: Managed Care, Other (non HMO) | Admitting: Speech Pathology

## 2020-07-21 ENCOUNTER — Encounter: Payer: Self-pay | Admitting: Speech Pathology

## 2020-07-21 ENCOUNTER — Encounter: Payer: Self-pay | Admitting: Occupational Therapy

## 2020-07-21 ENCOUNTER — Other Ambulatory Visit: Payer: Self-pay

## 2020-07-21 DIAGNOSIS — M545 Low back pain, unspecified: Secondary | ICD-10-CM

## 2020-07-21 DIAGNOSIS — M6281 Muscle weakness (generalized): Secondary | ICD-10-CM

## 2020-07-21 DIAGNOSIS — M25561 Pain in right knee: Secondary | ICD-10-CM

## 2020-07-21 DIAGNOSIS — R2689 Other abnormalities of gait and mobility: Secondary | ICD-10-CM

## 2020-07-21 DIAGNOSIS — R41841 Cognitive communication deficit: Secondary | ICD-10-CM

## 2020-07-21 DIAGNOSIS — M62838 Other muscle spasm: Secondary | ICD-10-CM

## 2020-07-21 DIAGNOSIS — R41840 Attention and concentration deficit: Secondary | ICD-10-CM

## 2020-07-21 DIAGNOSIS — R29898 Other symptoms and signs involving the musculoskeletal system: Secondary | ICD-10-CM

## 2020-07-21 DIAGNOSIS — R4701 Aphasia: Secondary | ICD-10-CM | POA: Diagnosis not present

## 2020-07-21 DIAGNOSIS — M25661 Stiffness of right knee, not elsewhere classified: Secondary | ICD-10-CM

## 2020-07-21 DIAGNOSIS — H53461 Homonymous bilateral field defects, right side: Secondary | ICD-10-CM
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
## 2020-07-23 ENCOUNTER — Encounter: Payer: Self-pay | Admitting: Speech Pathology

## 2020-07-23 ENCOUNTER — Other Ambulatory Visit: Payer: Self-pay | Admitting: Registered Nurse

## 2020-07-23 ENCOUNTER — Other Ambulatory Visit: Payer: Self-pay

## 2020-07-23 ENCOUNTER — Ambulatory Visit: Payer: Managed Care, Other (non HMO) | Admitting: Speech Pathology

## 2020-07-23 DIAGNOSIS — R4701 Aphasia: Secondary | ICD-10-CM | POA: Diagnosis not present

## 2020-07-23 DIAGNOSIS — R41841 Cognitive communication deficit: Secondary | ICD-10-CM | POA: Diagnosis not present
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
## 2020-07-25 ENCOUNTER — Encounter: Payer: Self-pay | Admitting: Speech Pathology

## 2020-07-25 ENCOUNTER — Ambulatory Visit: Payer: Managed Care, Other (non HMO) | Admitting: Occupational Therapy

## 2020-07-25 ENCOUNTER — Other Ambulatory Visit: Payer: Self-pay

## 2020-07-25 ENCOUNTER — Encounter: Payer: Self-pay | Admitting: Occupational Therapy

## 2020-07-25 ENCOUNTER — Ambulatory Visit: Payer: Managed Care, Other (non HMO)

## 2020-07-25 ENCOUNTER — Ambulatory Visit: Payer: Managed Care, Other (non HMO) | Admitting: Speech Pathology

## 2020-07-25 DIAGNOSIS — M62838 Other muscle spasm: Secondary | ICD-10-CM

## 2020-07-25 DIAGNOSIS — R4701 Aphasia: Secondary | ICD-10-CM | POA: Diagnosis not present

## 2020-07-25 DIAGNOSIS — H53461 Homonymous bilateral field defects, right side: Secondary | ICD-10-CM

## 2020-07-25 DIAGNOSIS — R41840 Attention and concentration deficit: Secondary | ICD-10-CM

## 2020-07-25 DIAGNOSIS — R41841 Cognitive communication deficit: Secondary | ICD-10-CM | POA: Diagnosis not present

## 2020-07-25 DIAGNOSIS — M6281 Muscle weakness (generalized): Secondary | ICD-10-CM

## 2020-07-25 DIAGNOSIS — M25661 Stiffness of right knee, not elsewhere classified: Secondary | ICD-10-CM

## 2020-07-25 DIAGNOSIS — R2689 Other abnormalities of gait and mobility: Secondary | ICD-10-CM

## 2020-07-25 DIAGNOSIS — M545 Low back pain, unspecified: Secondary | ICD-10-CM

## 2020-07-25 DIAGNOSIS — M25561 Pain in right knee: Secondary | ICD-10-CM

## 2020-07-25 DIAGNOSIS — R29898 Other symptoms and signs involving the musculoskeletal system: Secondary | ICD-10-CM

## 2020-07-25 MED ORDER — METHYLPHENIDATE HCL 20 MG PO TABS: 20.0000 mg | ORAL_TABLET | Freq: Two times a day (BID) | ORAL | 0 refills | Status: DC

## 2020-07-25 NOTE — Telephone Encounter (Signed)
New RX for 20mg  sent in.
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
## 2020-07-28 ENCOUNTER — Encounter: Payer: Self-pay | Admitting: Speech Pathology

## 2020-07-28 ENCOUNTER — Encounter: Payer: Self-pay | Admitting: Occupational Therapy

## 2020-07-28 ENCOUNTER — Telehealth: Payer: Self-pay | Admitting: Registered Nurse

## 2020-07-28 ENCOUNTER — Ambulatory Visit: Payer: Managed Care, Other (non HMO) | Admitting: Speech Pathology

## 2020-07-28 ENCOUNTER — Other Ambulatory Visit: Payer: Self-pay

## 2020-07-28 ENCOUNTER — Ambulatory Visit: Payer: Managed Care, Other (non HMO)

## 2020-07-28 ENCOUNTER — Ambulatory Visit: Payer: Managed Care, Other (non HMO) | Admitting: Occupational Therapy

## 2020-07-28 DIAGNOSIS — R29898 Other symptoms and signs involving the musculoskeletal system: Secondary | ICD-10-CM

## 2020-07-28 DIAGNOSIS — M6281 Muscle weakness (generalized): Secondary | ICD-10-CM

## 2020-07-28 DIAGNOSIS — R41841 Cognitive communication deficit: Secondary | ICD-10-CM | POA: Diagnosis not present

## 2020-07-28 DIAGNOSIS — S069X0D Unspecified intracranial injury without loss of consciousness, subsequent encounter: Secondary | ICD-10-CM

## 2020-07-28 DIAGNOSIS — M62838 Other muscle spasm: Secondary | ICD-10-CM

## 2020-07-28 DIAGNOSIS — R41840 Attention and concentration deficit: Secondary | ICD-10-CM

## 2020-07-28 DIAGNOSIS — H53461 Homonymous bilateral field defects, right side: Secondary | ICD-10-CM

## 2020-07-28 DIAGNOSIS — M545 Low back pain, unspecified: Secondary | ICD-10-CM

## 2020-07-28 DIAGNOSIS — M25661 Stiffness of right knee, not elsewhere classified: Secondary | ICD-10-CM

## 2020-07-28 DIAGNOSIS — R2689 Other abnormalities of gait and mobility: Secondary | ICD-10-CM

## 2020-07-28 DIAGNOSIS — R4701 Aphasia: Secondary | ICD-10-CM

## 2020-07-28 DIAGNOSIS — M25561 Pain in right knee: Secondary | ICD-10-CM

## 2020-07-28 NOTE — Patient Instructions (Signed)
Practice some breathing in your safe, quiet room on the main level.

## 2020-07-28 NOTE — Patient Instructions (Signed)
Access Code: [email or web address] URL: [email or web address] Date: 07/28/2020 Prepared by: Merleen Nicely, OTR/L  Exercises - Seated Single Arm Shoulder Flexion with Dumbbell - 2 sets - 10 reps - Seated Single Arm Elbow Flexion with Dumbbell - 2 sets - 15 reps - Seated Single Arm Shoulder Extension with Dumbbell - 2 sets - 10 reps - Seated Shoulder External Rotation with Dumbbells - 2 sets - 10 reps

## 2020-07-28 NOTE — Telephone Encounter (Signed)
Placed a call to [name], she states Adam Bates is having emotional outbursts and is unable to control his emotions. We will check his Depakote troph level in the morning prior to his  medication, she verbalizes understanding. This was discussed with Dr Naaman Plummer earlier today.
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
## 2020-07-28 NOTE — Telephone Encounter (Signed)
Placed a call to Ms. Adam Bates, regarding her My-Chart Message. This provider was in contact with Dr Naaman Plummer regarding her message. No answer, awaiting a return call.
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
## 2020-07-30 ENCOUNTER — Encounter: Payer: Self-pay | Admitting: Occupational Therapy

## 2020-07-30 ENCOUNTER — Encounter: Payer: Self-pay | Admitting: Speech Pathology

## 2020-07-30 ENCOUNTER — Ambulatory Visit: Payer: Managed Care, Other (non HMO) | Admitting: Speech Pathology

## 2020-07-30 ENCOUNTER — Telehealth: Payer: Self-pay | Admitting: Registered Nurse

## 2020-07-30 ENCOUNTER — Ambulatory Visit: Payer: Managed Care, Other (non HMO) | Admitting: Occupational Therapy

## 2020-07-30 ENCOUNTER — Other Ambulatory Visit: Payer: Self-pay

## 2020-07-30 ENCOUNTER — Other Ambulatory Visit: Payer: Self-pay | Admitting: Registered Nurse

## 2020-07-30 DIAGNOSIS — R41841 Cognitive communication deficit: Secondary | ICD-10-CM | POA: Diagnosis not present

## 2020-07-30 DIAGNOSIS — R4701 Aphasia: Secondary | ICD-10-CM

## 2020-07-30 DIAGNOSIS — S069X0D Unspecified intracranial injury without loss of consciousness, subsequent encounter: Secondary | ICD-10-CM

## 2020-07-30 DIAGNOSIS — R29898 Other symptoms and signs involving the musculoskeletal system: Secondary | ICD-10-CM

## 2020-07-30 DIAGNOSIS — R41840 Attention and concentration deficit: Secondary | ICD-10-CM

## 2020-07-30 DIAGNOSIS — M6281 Muscle weakness (generalized): Secondary | ICD-10-CM

## 2020-07-30 DIAGNOSIS — H53461 Homonymous bilateral field defects, right side: Secondary | ICD-10-CM

## 2020-07-30 LAB — VALPROIC ACID LEVEL: Valproic Acid Lvl: 41 ug/mL — ABNORMAL LOW (ref 50–100)

## 2020-07-30 MED ORDER — DIVALPROEX SODIUM 250 MG PO DR TAB: 250.0000 mg | DELAYED_RELEASE_TABLET | Freq: Three times a day (TID) | ORAL | 1 refills | Status: DC

## 2020-07-30 MED ORDER — DIVALPROEX SODIUM 500 MG PO DR TAB: 500.0000 mg | DELAYED_RELEASE_TABLET | Freq: Three times a day (TID) | ORAL | 1 refills | Status: DC

## 2020-07-30 NOTE — Patient Instructions (Signed)
Use your semantic feature analysis and pick one object to describe.
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
## 2020-07-30 NOTE — Telephone Encounter (Signed)
This provider spoke with Dr Naaman Plummer. Adam Bates depakote level is 41. His Depakote will be increased to 750 mg TID, we will re-check a level in two weeks. The above was discussed with Adam Bates, she verbalizes understanding.  Depakote 500 mg and Depakote 250 mg was sent to pharmacy today and I spoke with pharmacist.

## 2020-08-01 ENCOUNTER — Ambulatory Visit: Payer: Managed Care, Other (non HMO) | Admitting: Speech Pathology

## 2020-08-01 ENCOUNTER — Ambulatory Visit: Payer: Managed Care, Other (non HMO) | Admitting: Occupational Therapy

## 2020-08-01 ENCOUNTER — Ambulatory Visit: Payer: Managed Care, Other (non HMO)

## 2020-08-01 ENCOUNTER — Encounter: Payer: Self-pay | Admitting: Speech Pathology

## 2020-08-01 ENCOUNTER — Other Ambulatory Visit: Payer: Self-pay

## 2020-08-01 DIAGNOSIS — R2689 Other abnormalities of gait and mobility: Secondary | ICD-10-CM

## 2020-08-01 DIAGNOSIS — M25561 Pain in right knee: Secondary | ICD-10-CM | POA: Insufficient documentation

## 2020-08-01 DIAGNOSIS — R41840 Attention and concentration deficit: Secondary | ICD-10-CM | POA: Insufficient documentation

## 2020-08-01 DIAGNOSIS — R29898 Other symptoms and signs involving the musculoskeletal system: Secondary | ICD-10-CM | POA: Insufficient documentation

## 2020-08-01 DIAGNOSIS — M25661 Stiffness of right knee, not elsewhere classified: Secondary | ICD-10-CM | POA: Insufficient documentation

## 2020-08-01 DIAGNOSIS — R4701 Aphasia: Secondary | ICD-10-CM | POA: Insufficient documentation

## 2020-08-01 DIAGNOSIS — S062X4S Diffuse traumatic brain injury with loss of consciousness of 6 hours to 24 hours, sequela: Secondary | ICD-10-CM | POA: Diagnosis present

## 2020-08-01 DIAGNOSIS — M545 Low back pain, unspecified: Secondary | ICD-10-CM | POA: Insufficient documentation

## 2020-08-01 DIAGNOSIS — R600 Localized edema: Secondary | ICD-10-CM | POA: Insufficient documentation

## 2020-08-01 DIAGNOSIS — M6281 Muscle weakness (generalized): Secondary | ICD-10-CM

## 2020-08-01 DIAGNOSIS — R41841 Cognitive communication deficit: Secondary | ICD-10-CM

## 2020-08-01 DIAGNOSIS — S069X0A Unspecified intracranial injury without loss of consciousness, initial encounter: Secondary | ICD-10-CM | POA: Insufficient documentation

## 2020-08-01 DIAGNOSIS — S82401S Unspecified fracture of shaft of right fibula, sequela: Secondary | ICD-10-CM | POA: Diagnosis present

## 2020-08-01 DIAGNOSIS — M62838 Other muscle spasm: Secondary | ICD-10-CM

## 2020-08-01 DIAGNOSIS — S82201S Unspecified fracture of shaft of right tibia, sequela: Secondary | ICD-10-CM | POA: Diagnosis present

## 2020-08-01 DIAGNOSIS — R293 Abnormal posture: Secondary | ICD-10-CM | POA: Insufficient documentation

## 2020-08-01 DIAGNOSIS — H53461 Homonymous bilateral field defects, right side: Secondary | ICD-10-CM | POA: Insufficient documentation
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
## 2020-08-04 ENCOUNTER — Ambulatory Visit: Payer: Managed Care, Other (non HMO) | Admitting: Speech Pathology

## 2020-08-04 ENCOUNTER — Ambulatory Visit: Payer: Managed Care, Other (non HMO) | Admitting: Occupational Therapy

## 2020-08-04 ENCOUNTER — Encounter: Payer: Self-pay | Admitting: Occupational Therapy

## 2020-08-04 ENCOUNTER — Other Ambulatory Visit: Payer: Self-pay

## 2020-08-04 ENCOUNTER — Ambulatory Visit: Payer: Managed Care, Other (non HMO)

## 2020-08-04 ENCOUNTER — Encounter: Payer: Self-pay | Admitting: Speech Pathology

## 2020-08-04 DIAGNOSIS — R4701 Aphasia: Secondary | ICD-10-CM

## 2020-08-04 DIAGNOSIS — M6281 Muscle weakness (generalized): Secondary | ICD-10-CM

## 2020-08-04 DIAGNOSIS — H53461 Homonymous bilateral field defects, right side: Secondary | ICD-10-CM

## 2020-08-04 DIAGNOSIS — R41840 Attention and concentration deficit: Secondary | ICD-10-CM

## 2020-08-04 DIAGNOSIS — M25661 Stiffness of right knee, not elsewhere classified: Secondary | ICD-10-CM

## 2020-08-04 DIAGNOSIS — S062X4S Diffuse traumatic brain injury with loss of consciousness of 6 hours to 24 hours, sequela: Secondary | ICD-10-CM | POA: Diagnosis not present

## 2020-08-04 DIAGNOSIS — R41841 Cognitive communication deficit: Secondary | ICD-10-CM

## 2020-08-04 DIAGNOSIS — M25561 Pain in right knee: Secondary | ICD-10-CM

## 2020-08-04 DIAGNOSIS — M62838 Other muscle spasm: Secondary | ICD-10-CM

## 2020-08-04 DIAGNOSIS — M545 Low back pain, unspecified: Secondary | ICD-10-CM

## 2020-08-04 DIAGNOSIS — R2689 Other abnormalities of gait and mobility: Secondary | ICD-10-CM

## 2020-08-04 DIAGNOSIS — R29898 Other symptoms and signs involving the musculoskeletal system: Secondary | ICD-10-CM
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
## 2020-08-06 ENCOUNTER — Encounter: Payer: Managed Care, Other (non HMO) | Admitting: Speech Pathology

## 2020-08-07 ENCOUNTER — Ambulatory Visit: Payer: Managed Care, Other (non HMO) | Admitting: Speech Pathology

## 2020-08-07 ENCOUNTER — Other Ambulatory Visit: Payer: Self-pay

## 2020-08-07 ENCOUNTER — Encounter: Payer: Self-pay | Admitting: Speech Pathology

## 2020-08-07 DIAGNOSIS — R41841 Cognitive communication deficit: Secondary | ICD-10-CM

## 2020-08-07 DIAGNOSIS — R4701 Aphasia: Secondary | ICD-10-CM

## 2020-08-07 DIAGNOSIS — S062X4S Diffuse traumatic brain injury with loss of consciousness of 6 hours to 24 hours, sequela: Secondary | ICD-10-CM | POA: Diagnosis not present
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
## 2020-08-08 ENCOUNTER — Ambulatory Visit: Payer: Managed Care, Other (non HMO) | Admitting: Speech Pathology

## 2020-08-08 ENCOUNTER — Encounter: Payer: Self-pay | Admitting: Speech Pathology

## 2020-08-08 ENCOUNTER — Ambulatory Visit: Payer: Managed Care, Other (non HMO) | Admitting: Occupational Therapy

## 2020-08-08 ENCOUNTER — Ambulatory Visit: Payer: Managed Care, Other (non HMO)

## 2020-08-08 DIAGNOSIS — R600 Localized edema: Secondary | ICD-10-CM

## 2020-08-08 DIAGNOSIS — R29898 Other symptoms and signs involving the musculoskeletal system: Secondary | ICD-10-CM

## 2020-08-08 DIAGNOSIS — M6281 Muscle weakness (generalized): Secondary | ICD-10-CM

## 2020-08-08 DIAGNOSIS — M545 Low back pain, unspecified: Secondary | ICD-10-CM

## 2020-08-08 DIAGNOSIS — R2689 Other abnormalities of gait and mobility: Secondary | ICD-10-CM

## 2020-08-08 DIAGNOSIS — R41841 Cognitive communication deficit: Secondary | ICD-10-CM

## 2020-08-08 DIAGNOSIS — M25661 Stiffness of right knee, not elsewhere classified: Secondary | ICD-10-CM

## 2020-08-08 DIAGNOSIS — M62838 Other muscle spasm: Secondary | ICD-10-CM

## 2020-08-08 DIAGNOSIS — R4701 Aphasia: Secondary | ICD-10-CM

## 2020-08-08 DIAGNOSIS — S062X4S Diffuse traumatic brain injury with loss of consciousness of 6 hours to 24 hours, sequela: Secondary | ICD-10-CM | POA: Diagnosis not present

## 2020-08-08 DIAGNOSIS — H53461 Homonymous bilateral field defects, right side: Secondary | ICD-10-CM

## 2020-08-08 DIAGNOSIS — R41840 Attention and concentration deficit: Secondary | ICD-10-CM

## 2020-08-08 DIAGNOSIS — M25561 Pain in right knee: Secondary | ICD-10-CM
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
## 2020-08-11 ENCOUNTER — Encounter: Payer: Managed Care, Other (non HMO) | Admitting: Speech Pathology

## 2020-08-11 ENCOUNTER — Ambulatory Visit: Payer: Managed Care, Other (non HMO) | Admitting: Occupational Therapy

## 2020-08-11 ENCOUNTER — Encounter: Payer: Managed Care, Other (non HMO) | Admitting: Occupational Therapy

## 2020-08-11 ENCOUNTER — Ambulatory Visit: Payer: Managed Care, Other (non HMO)

## 2020-08-11 ENCOUNTER — Encounter: Payer: Self-pay | Admitting: Occupational Therapy

## 2020-08-11 ENCOUNTER — Other Ambulatory Visit: Payer: Self-pay

## 2020-08-11 DIAGNOSIS — M545 Low back pain, unspecified: Secondary | ICD-10-CM

## 2020-08-11 DIAGNOSIS — M6281 Muscle weakness (generalized): Secondary | ICD-10-CM

## 2020-08-11 DIAGNOSIS — M25561 Pain in right knee: Secondary | ICD-10-CM

## 2020-08-11 DIAGNOSIS — S062X4S Diffuse traumatic brain injury with loss of consciousness of 6 hours to 24 hours, sequela: Secondary | ICD-10-CM | POA: Diagnosis not present

## 2020-08-11 DIAGNOSIS — R2689 Other abnormalities of gait and mobility: Secondary | ICD-10-CM

## 2020-08-11 DIAGNOSIS — H53461 Homonymous bilateral field defects, right side: Secondary | ICD-10-CM

## 2020-08-11 DIAGNOSIS — M25661 Stiffness of right knee, not elsewhere classified: Secondary | ICD-10-CM

## 2020-08-11 DIAGNOSIS — R29898 Other symptoms and signs involving the musculoskeletal system: Secondary | ICD-10-CM

## 2020-08-11 DIAGNOSIS — R41840 Attention and concentration deficit: Secondary | ICD-10-CM

## 2020-08-11 DIAGNOSIS — R293 Abnormal posture: Secondary | ICD-10-CM
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
## 2020-08-13 ENCOUNTER — Encounter: Payer: Self-pay | Admitting: Speech Pathology

## 2020-08-13 ENCOUNTER — Encounter (HOSPITAL_COMMUNITY): Payer: Self-pay | Admitting: Physical Medicine & Rehabilitation

## 2020-08-13 ENCOUNTER — Ambulatory Visit: Payer: Managed Care, Other (non HMO) | Admitting: Speech Pathology

## 2020-08-13 ENCOUNTER — Other Ambulatory Visit: Payer: Self-pay

## 2020-08-13 DIAGNOSIS — R41841 Cognitive communication deficit: Secondary | ICD-10-CM

## 2020-08-13 DIAGNOSIS — S062X4S Diffuse traumatic brain injury with loss of consciousness of 6 hours to 24 hours, sequela: Secondary | ICD-10-CM | POA: Diagnosis not present

## 2020-08-13 DIAGNOSIS — R4701 Aphasia: Secondary | ICD-10-CM
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
## 2020-08-14 ENCOUNTER — Ambulatory Visit: Payer: Managed Care, Other (non HMO) | Admitting: Speech Pathology

## 2020-08-14 ENCOUNTER — Encounter: Payer: Self-pay | Admitting: Speech Pathology

## 2020-08-14 ENCOUNTER — Ambulatory Visit: Payer: Managed Care, Other (non HMO)

## 2020-08-14 ENCOUNTER — Ambulatory Visit: Payer: Managed Care, Other (non HMO) | Admitting: Occupational Therapy

## 2020-08-14 DIAGNOSIS — M6281 Muscle weakness (generalized): Secondary | ICD-10-CM

## 2020-08-14 DIAGNOSIS — M25661 Stiffness of right knee, not elsewhere classified: Secondary | ICD-10-CM

## 2020-08-14 DIAGNOSIS — M545 Low back pain, unspecified: Secondary | ICD-10-CM

## 2020-08-14 DIAGNOSIS — R293 Abnormal posture: Secondary | ICD-10-CM

## 2020-08-14 DIAGNOSIS — S062X4S Diffuse traumatic brain injury with loss of consciousness of 6 hours to 24 hours, sequela: Secondary | ICD-10-CM | POA: Diagnosis not present

## 2020-08-14 DIAGNOSIS — R4701 Aphasia: Secondary | ICD-10-CM

## 2020-08-14 DIAGNOSIS — R41841 Cognitive communication deficit: Secondary | ICD-10-CM

## 2020-08-14 DIAGNOSIS — M62838 Other muscle spasm: Secondary | ICD-10-CM

## 2020-08-14 DIAGNOSIS — R2689 Other abnormalities of gait and mobility: Secondary | ICD-10-CM

## 2020-08-14 DIAGNOSIS — M25561 Pain in right knee: Secondary | ICD-10-CM
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
## 2020-08-16 LAB — VALPROIC ACID LEVEL: Valproic Acid Lvl: 64 ug/mL (ref 50–100)

## 2020-08-18 ENCOUNTER — Ambulatory Visit: Payer: Managed Care, Other (non HMO)

## 2020-08-18 ENCOUNTER — Ambulatory Visit: Payer: Managed Care, Other (non HMO) | Admitting: Speech Pathology

## 2020-08-18 ENCOUNTER — Encounter: Payer: Self-pay | Admitting: Speech Pathology

## 2020-08-18 ENCOUNTER — Ambulatory Visit: Payer: Managed Care, Other (non HMO) | Admitting: Occupational Therapy

## 2020-08-18 ENCOUNTER — Encounter: Payer: Self-pay | Admitting: Occupational Therapy

## 2020-08-18 ENCOUNTER — Other Ambulatory Visit: Payer: Self-pay

## 2020-08-18 DIAGNOSIS — S062X4S Diffuse traumatic brain injury with loss of consciousness of 6 hours to 24 hours, sequela: Secondary | ICD-10-CM | POA: Diagnosis not present

## 2020-08-18 DIAGNOSIS — R293 Abnormal posture: Secondary | ICD-10-CM

## 2020-08-18 DIAGNOSIS — M25661 Stiffness of right knee, not elsewhere classified: Secondary | ICD-10-CM

## 2020-08-18 DIAGNOSIS — R41840 Attention and concentration deficit: Secondary | ICD-10-CM

## 2020-08-18 DIAGNOSIS — R4701 Aphasia: Secondary | ICD-10-CM

## 2020-08-18 DIAGNOSIS — R29898 Other symptoms and signs involving the musculoskeletal system: Secondary | ICD-10-CM

## 2020-08-18 DIAGNOSIS — M545 Low back pain, unspecified: Secondary | ICD-10-CM

## 2020-08-18 DIAGNOSIS — M6281 Muscle weakness (generalized): Secondary | ICD-10-CM

## 2020-08-18 DIAGNOSIS — R41841 Cognitive communication deficit: Secondary | ICD-10-CM

## 2020-08-18 DIAGNOSIS — R2689 Other abnormalities of gait and mobility: Secondary | ICD-10-CM

## 2020-08-18 DIAGNOSIS — M62838 Other muscle spasm: Secondary | ICD-10-CM

## 2020-08-18 DIAGNOSIS — H53461 Homonymous bilateral field defects, right side: Secondary | ICD-10-CM

## 2020-08-18 DIAGNOSIS — M25561 Pain in right knee: Secondary | ICD-10-CM
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
## 2020-08-19 ENCOUNTER — Other Ambulatory Visit: Payer: Self-pay | Admitting: Registered Nurse

## 2020-08-20 ENCOUNTER — Encounter: Payer: Self-pay | Admitting: Speech Pathology

## 2020-08-20 ENCOUNTER — Other Ambulatory Visit: Payer: Self-pay

## 2020-08-20 ENCOUNTER — Ambulatory Visit: Payer: Managed Care, Other (non HMO) | Admitting: Speech Pathology

## 2020-08-20 DIAGNOSIS — R4701 Aphasia: Secondary | ICD-10-CM

## 2020-08-20 DIAGNOSIS — S062X4S Diffuse traumatic brain injury with loss of consciousness of 6 hours to 24 hours, sequela: Secondary | ICD-10-CM | POA: Diagnosis not present

## 2020-08-20 DIAGNOSIS — R41841 Cognitive communication deficit: Secondary | ICD-10-CM
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
## 2020-08-22 ENCOUNTER — Encounter: Payer: Self-pay | Admitting: Speech Pathology

## 2020-08-22 ENCOUNTER — Ambulatory Visit: Payer: Managed Care, Other (non HMO) | Admitting: Speech Pathology

## 2020-08-22 ENCOUNTER — Ambulatory Visit: Payer: Managed Care, Other (non HMO)

## 2020-08-22 ENCOUNTER — Ambulatory Visit: Payer: Managed Care, Other (non HMO) | Admitting: Occupational Therapy

## 2020-08-22 ENCOUNTER — Other Ambulatory Visit: Payer: Self-pay

## 2020-08-22 DIAGNOSIS — R41841 Cognitive communication deficit: Secondary | ICD-10-CM

## 2020-08-22 DIAGNOSIS — R4701 Aphasia: Secondary | ICD-10-CM

## 2020-08-22 DIAGNOSIS — S062X4S Diffuse traumatic brain injury with loss of consciousness of 6 hours to 24 hours, sequela: Secondary | ICD-10-CM | POA: Diagnosis not present

## 2020-08-22 DIAGNOSIS — R2689 Other abnormalities of gait and mobility: Secondary | ICD-10-CM

## 2020-08-22 DIAGNOSIS — M545 Low back pain, unspecified: Secondary | ICD-10-CM

## 2020-08-22 DIAGNOSIS — M25661 Stiffness of right knee, not elsewhere classified: Secondary | ICD-10-CM

## 2020-08-22 DIAGNOSIS — R293 Abnormal posture: Secondary | ICD-10-CM

## 2020-08-22 DIAGNOSIS — R29898 Other symptoms and signs involving the musculoskeletal system: Secondary | ICD-10-CM

## 2020-08-22 DIAGNOSIS — M62838 Other muscle spasm: Secondary | ICD-10-CM

## 2020-08-22 DIAGNOSIS — M25561 Pain in right knee: Secondary | ICD-10-CM

## 2020-08-22 DIAGNOSIS — M6281 Muscle weakness (generalized): Secondary | ICD-10-CM

## 2020-08-22 NOTE — Patient Instructions (Signed)
Access Code: [email or web address] URL: [email or web address] Date: 08/22/2020 Prepared by: Sherlynn Stalls  Exercises Standing March with Counter Support - 1 x daily - 7 x weekly - 2 sets - 10 reps Standing Hip Abduction with Counter Support - 1 x daily - 7 x weekly - 2 sets - 10 reps Sit to Stand with Counter Support - 1 x daily - 7 x weekly - 2 sets - 10 reps
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
## 2020-08-23 ENCOUNTER — Other Ambulatory Visit: Payer: Self-pay | Admitting: Registered Nurse

## 2020-08-25 ENCOUNTER — Ambulatory Visit: Payer: Managed Care, Other (non HMO) | Admitting: Occupational Therapy

## 2020-08-25 ENCOUNTER — Other Ambulatory Visit: Payer: Self-pay

## 2020-08-25 ENCOUNTER — Other Ambulatory Visit: Payer: Self-pay | Admitting: Registered Nurse

## 2020-08-25 ENCOUNTER — Ambulatory Visit: Payer: Managed Care, Other (non HMO) | Admitting: Speech Pathology

## 2020-08-25 ENCOUNTER — Ambulatory Visit: Payer: Managed Care, Other (non HMO)

## 2020-08-25 ENCOUNTER — Encounter: Payer: Self-pay | Admitting: Speech Pathology

## 2020-08-25 ENCOUNTER — Encounter: Payer: Self-pay | Admitting: Occupational Therapy

## 2020-08-25 ENCOUNTER — Telehealth: Payer: Self-pay | Admitting: Registered Nurse

## 2020-08-25 DIAGNOSIS — M62838 Other muscle spasm: Secondary | ICD-10-CM

## 2020-08-25 DIAGNOSIS — R4701 Aphasia: Secondary | ICD-10-CM

## 2020-08-25 DIAGNOSIS — M25661 Stiffness of right knee, not elsewhere classified: Secondary | ICD-10-CM

## 2020-08-25 DIAGNOSIS — M6281 Muscle weakness (generalized): Secondary | ICD-10-CM

## 2020-08-25 DIAGNOSIS — M545 Low back pain, unspecified: Secondary | ICD-10-CM

## 2020-08-25 DIAGNOSIS — R2689 Other abnormalities of gait and mobility: Secondary | ICD-10-CM

## 2020-08-25 DIAGNOSIS — S062X4S Diffuse traumatic brain injury with loss of consciousness of 6 hours to 24 hours, sequela: Secondary | ICD-10-CM | POA: Diagnosis not present

## 2020-08-25 DIAGNOSIS — R600 Localized edema: Secondary | ICD-10-CM

## 2020-08-25 DIAGNOSIS — S069X0A Unspecified intracranial injury without loss of consciousness, initial encounter: Secondary | ICD-10-CM

## 2020-08-25 DIAGNOSIS — R293 Abnormal posture: Secondary | ICD-10-CM

## 2020-08-25 DIAGNOSIS — H53461 Homonymous bilateral field defects, right side: Secondary | ICD-10-CM

## 2020-08-25 DIAGNOSIS — R41841 Cognitive communication deficit: Secondary | ICD-10-CM

## 2020-08-25 DIAGNOSIS — R29898 Other symptoms and signs involving the musculoskeletal system: Secondary | ICD-10-CM

## 2020-08-25 DIAGNOSIS — R41840 Attention and concentration deficit: Secondary | ICD-10-CM

## 2020-08-25 DIAGNOSIS — M25561 Pain in right knee: Secondary | ICD-10-CM

## 2020-08-25 MED ORDER — CITALOPRAM HYDROBROMIDE 10 MG PO TABS
30.0000 mg | ORAL_TABLET | Freq: Every day | ORAL | 1 refills | Status: DC
Start: 2020-08-25 — End: 2020-10-23

## 2020-08-25 MED ORDER — METHYLPHENIDATE HCL 20 MG PO TABS: 20.0000 mg | ORAL_TABLET | Freq: Two times a day (BID) | ORAL | 0 refills | Status: DC

## 2020-08-25 MED ORDER — DIVALPROEX SODIUM 500 MG PO DR TAB: 500.0000 mg | DELAYED_RELEASE_TABLET | Freq: Three times a day (TID) | ORAL | 1 refills | Status: DC
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
## 2020-08-25 NOTE — Telephone Encounter (Signed)
PMP was Reviewed:  Medication Refill sent: Celexa, Depakote and Ritalin.  Adam Bates is awre via My-Chart message.

## 2020-08-26 NOTE — Patient Instructions (Signed)
Access Code: [email or web address] URL: [email or web address] Date: 08/26/2020  Exercises: - Shoulder Overhead Press with Dumbbells - Standing Shoulder Flexion with Dumbbells - Shoulder Extension with Dumbbells - Standing Alternating Bicep Curls with Dumbbells - Shoulder External Rotation with Dumbbells  *Try to complete these exercises about 2x/day, 1 set of 15 repetitions or 2 sets of 10 repetitions
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
## 2020-08-27 ENCOUNTER — Encounter: Payer: Self-pay | Admitting: Speech Pathology

## 2020-08-27 ENCOUNTER — Ambulatory Visit: Payer: Managed Care, Other (non HMO) | Admitting: Speech Pathology

## 2020-08-27 ENCOUNTER — Encounter: Payer: Self-pay | Admitting: Physical Medicine & Rehabilitation

## 2020-08-27 ENCOUNTER — Encounter: Payer: Managed Care, Other (non HMO) | Attending: Registered Nurse | Admitting: Physical Medicine & Rehabilitation

## 2020-08-27 ENCOUNTER — Other Ambulatory Visit: Payer: Self-pay

## 2020-08-27 VITALS — BP 131/81 | HR 95 | Temp 98.7°F | Ht 69.0 in | Wt 218.0 lb

## 2020-08-27 DIAGNOSIS — S82401S Unspecified fracture of shaft of right fibula, sequela: Secondary | ICD-10-CM | POA: Diagnosis not present

## 2020-08-27 DIAGNOSIS — S82201S Unspecified fracture of shaft of right tibia, sequela: Secondary | ICD-10-CM | POA: Diagnosis not present

## 2020-08-27 DIAGNOSIS — R4701 Aphasia: Secondary | ICD-10-CM

## 2020-08-27 DIAGNOSIS — S062X4S Diffuse traumatic brain injury with loss of consciousness of 6 hours to 24 hours, sequela: Secondary | ICD-10-CM | POA: Insufficient documentation

## 2020-08-27 DIAGNOSIS — M6281 Muscle weakness (generalized): Secondary | ICD-10-CM | POA: Insufficient documentation

## 2020-08-27 DIAGNOSIS — R41841 Cognitive communication deficit: Secondary | ICD-10-CM

## 2020-08-27 DIAGNOSIS — M62838 Other muscle spasm: Secondary | ICD-10-CM | POA: Insufficient documentation

## 2020-08-27 MED ORDER — METHYLPHENIDATE HCL 20 MG PO TABS: 20.0000 mg | ORAL_TABLET | Freq: Two times a day (BID) | ORAL | 0 refills | Status: DC

## 2020-08-27 NOTE — Patient Instructions (Addendum)
ZYPREXA TAPER:  DAYS 1-5: 2.5MG  IN AM AND 10MG  IN PM DAYS 6-10: 2.5MG  AM AND 7.5MG  IN PM DAYS 11-15 2.5MG  AM AND 5MG  IN PM DAYS 15-20 5MG  IN PM ONLY---CONTINUE THIS DOSE.   ELEVATE YOUR RIGHT LEG DURING THE DAY  TRY A COMPRESSION SLEEVE FOR YOUR RIGHT LEG TO DECREASE YOUR DAY TIME SWELLING.
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
## 2020-08-29 ENCOUNTER — Encounter: Payer: Self-pay | Admitting: Speech Pathology

## 2020-08-29 ENCOUNTER — Ambulatory Visit: Payer: Managed Care, Other (non HMO) | Admitting: Occupational Therapy

## 2020-08-29 ENCOUNTER — Ambulatory Visit: Payer: Managed Care, Other (non HMO) | Admitting: Speech Pathology

## 2020-08-29 ENCOUNTER — Ambulatory Visit: Payer: Managed Care, Other (non HMO)

## 2020-08-29 ENCOUNTER — Other Ambulatory Visit: Payer: Self-pay

## 2020-08-29 DIAGNOSIS — M6281 Muscle weakness (generalized): Secondary | ICD-10-CM

## 2020-08-29 DIAGNOSIS — S062X4S Diffuse traumatic brain injury with loss of consciousness of 6 hours to 24 hours, sequela: Secondary | ICD-10-CM | POA: Diagnosis not present

## 2020-08-29 DIAGNOSIS — R600 Localized edema: Secondary | ICD-10-CM

## 2020-08-29 DIAGNOSIS — M62838 Other muscle spasm: Secondary | ICD-10-CM

## 2020-08-29 DIAGNOSIS — R41841 Cognitive communication deficit: Secondary | ICD-10-CM

## 2020-08-29 DIAGNOSIS — M545 Low back pain, unspecified: Secondary | ICD-10-CM

## 2020-08-29 DIAGNOSIS — S069X0A Unspecified intracranial injury without loss of consciousness, initial encounter: Secondary | ICD-10-CM

## 2020-08-29 DIAGNOSIS — R4701 Aphasia: Secondary | ICD-10-CM

## 2020-08-29 DIAGNOSIS — M25561 Pain in right knee: Secondary | ICD-10-CM

## 2020-08-29 DIAGNOSIS — M25661 Stiffness of right knee, not elsewhere classified: Secondary | ICD-10-CM
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
## 2020-09-01 ENCOUNTER — Ambulatory Visit: Payer: Managed Care, Other (non HMO) | Admitting: Occupational Therapy

## 2020-09-01 ENCOUNTER — Other Ambulatory Visit: Payer: Self-pay

## 2020-09-01 ENCOUNTER — Ambulatory Visit: Payer: Managed Care, Other (non HMO) | Attending: Physician Assistant

## 2020-09-01 ENCOUNTER — Ambulatory Visit: Payer: Managed Care, Other (non HMO) | Admitting: Speech Pathology

## 2020-09-01 ENCOUNTER — Encounter: Payer: Self-pay | Admitting: Speech Pathology

## 2020-09-01 DIAGNOSIS — M545 Low back pain, unspecified: Secondary | ICD-10-CM | POA: Diagnosis present

## 2020-09-01 DIAGNOSIS — M25661 Stiffness of right knee, not elsewhere classified: Secondary | ICD-10-CM | POA: Diagnosis present

## 2020-09-01 DIAGNOSIS — R29898 Other symptoms and signs involving the musculoskeletal system: Secondary | ICD-10-CM | POA: Insufficient documentation

## 2020-09-01 DIAGNOSIS — R600 Localized edema: Secondary | ICD-10-CM | POA: Insufficient documentation

## 2020-09-01 DIAGNOSIS — M25561 Pain in right knee: Secondary | ICD-10-CM | POA: Diagnosis present

## 2020-09-01 DIAGNOSIS — R4701 Aphasia: Secondary | ICD-10-CM | POA: Insufficient documentation

## 2020-09-01 DIAGNOSIS — R41841 Cognitive communication deficit: Secondary | ICD-10-CM | POA: Diagnosis present

## 2020-09-01 DIAGNOSIS — R293 Abnormal posture: Secondary | ICD-10-CM | POA: Diagnosis present

## 2020-09-01 DIAGNOSIS — S069X0A Unspecified intracranial injury without loss of consciousness, initial encounter: Secondary | ICD-10-CM | POA: Diagnosis present

## 2020-09-01 DIAGNOSIS — M6281 Muscle weakness (generalized): Secondary | ICD-10-CM

## 2020-09-01 DIAGNOSIS — M62838 Other muscle spasm: Secondary | ICD-10-CM | POA: Diagnosis present
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
## 2020-09-03 ENCOUNTER — Encounter: Payer: Self-pay | Admitting: Speech Pathology

## 2020-09-03 ENCOUNTER — Other Ambulatory Visit: Payer: Self-pay

## 2020-09-03 ENCOUNTER — Ambulatory Visit: Payer: Managed Care, Other (non HMO) | Admitting: Speech Pathology

## 2020-09-03 DIAGNOSIS — R41841 Cognitive communication deficit: Secondary | ICD-10-CM

## 2020-09-03 DIAGNOSIS — M6281 Muscle weakness (generalized): Secondary | ICD-10-CM | POA: Diagnosis not present

## 2020-09-03 DIAGNOSIS — R4701 Aphasia: Secondary | ICD-10-CM
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
## 2020-09-05 ENCOUNTER — Other Ambulatory Visit: Payer: Self-pay

## 2020-09-05 ENCOUNTER — Ambulatory Visit: Payer: Managed Care, Other (non HMO) | Admitting: Speech Pathology

## 2020-09-05 ENCOUNTER — Encounter: Payer: Self-pay | Admitting: Speech Pathology

## 2020-09-05 ENCOUNTER — Ambulatory Visit: Payer: Managed Care, Other (non HMO)

## 2020-09-05 ENCOUNTER — Encounter: Payer: Managed Care, Other (non HMO) | Admitting: Occupational Therapy

## 2020-09-05 DIAGNOSIS — M6281 Muscle weakness (generalized): Secondary | ICD-10-CM

## 2020-09-05 DIAGNOSIS — S069X0A Unspecified intracranial injury without loss of consciousness, initial encounter: Secondary | ICD-10-CM

## 2020-09-05 DIAGNOSIS — M62838 Other muscle spasm: Secondary | ICD-10-CM

## 2020-09-05 DIAGNOSIS — R41841 Cognitive communication deficit: Secondary | ICD-10-CM

## 2020-09-05 DIAGNOSIS — M545 Low back pain, unspecified: Secondary | ICD-10-CM

## 2020-09-05 DIAGNOSIS — M25661 Stiffness of right knee, not elsewhere classified: Secondary | ICD-10-CM

## 2020-09-05 DIAGNOSIS — R4701 Aphasia: Secondary | ICD-10-CM

## 2020-09-05 DIAGNOSIS — R600 Localized edema: Secondary | ICD-10-CM

## 2020-09-05 DIAGNOSIS — M25561 Pain in right knee: Secondary | ICD-10-CM

## 2020-09-05 DIAGNOSIS — R293 Abnormal posture: Secondary | ICD-10-CM
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
## 2020-09-05 NOTE — Telephone Encounter (Signed)
I called and spoke to Adam Bates. I gave her instructions for re-titration. We'll try to use the least effective amount we can.

## 2020-09-08 ENCOUNTER — Encounter: Payer: Managed Care, Other (non HMO) | Admitting: Occupational Therapy

## 2020-09-08 ENCOUNTER — Encounter: Payer: Self-pay | Admitting: Speech Pathology

## 2020-09-08 ENCOUNTER — Other Ambulatory Visit: Payer: Self-pay

## 2020-09-08 ENCOUNTER — Ambulatory Visit: Payer: Managed Care, Other (non HMO)

## 2020-09-08 ENCOUNTER — Ambulatory Visit: Payer: Managed Care, Other (non HMO) | Admitting: Speech Pathology

## 2020-09-08 DIAGNOSIS — M25561 Pain in right knee: Secondary | ICD-10-CM

## 2020-09-08 DIAGNOSIS — R600 Localized edema: Secondary | ICD-10-CM

## 2020-09-08 DIAGNOSIS — M25661 Stiffness of right knee, not elsewhere classified: Secondary | ICD-10-CM

## 2020-09-08 DIAGNOSIS — R41841 Cognitive communication deficit: Secondary | ICD-10-CM

## 2020-09-08 DIAGNOSIS — R293 Abnormal posture: Secondary | ICD-10-CM

## 2020-09-08 DIAGNOSIS — R4701 Aphasia: Secondary | ICD-10-CM

## 2020-09-08 DIAGNOSIS — S069X0A Unspecified intracranial injury without loss of consciousness, initial encounter: Secondary | ICD-10-CM

## 2020-09-08 DIAGNOSIS — M62838 Other muscle spasm: Secondary | ICD-10-CM

## 2020-09-08 DIAGNOSIS — M545 Low back pain, unspecified: Secondary | ICD-10-CM

## 2020-09-08 DIAGNOSIS — M6281 Muscle weakness (generalized): Secondary | ICD-10-CM | POA: Diagnosis not present
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
## 2020-09-10 ENCOUNTER — Ambulatory Visit: Payer: Managed Care, Other (non HMO) | Admitting: Speech Pathology

## 2020-09-10 ENCOUNTER — Other Ambulatory Visit: Payer: Self-pay

## 2020-09-10 ENCOUNTER — Encounter: Payer: Self-pay | Admitting: Speech Pathology

## 2020-09-10 DIAGNOSIS — R4701 Aphasia: Secondary | ICD-10-CM

## 2020-09-10 DIAGNOSIS — M6281 Muscle weakness (generalized): Secondary | ICD-10-CM | POA: Diagnosis not present

## 2020-09-10 DIAGNOSIS — R41841 Cognitive communication deficit: Secondary | ICD-10-CM
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
## 2020-09-12 ENCOUNTER — Ambulatory Visit: Payer: Managed Care, Other (non HMO)

## 2020-09-12 ENCOUNTER — Ambulatory Visit: Payer: Managed Care, Other (non HMO) | Admitting: Speech Pathology

## 2020-09-12 ENCOUNTER — Encounter: Payer: Managed Care, Other (non HMO) | Admitting: Occupational Therapy

## 2020-09-12 ENCOUNTER — Encounter: Payer: Self-pay | Admitting: Speech Pathology

## 2020-09-12 ENCOUNTER — Other Ambulatory Visit: Payer: Self-pay

## 2020-09-12 DIAGNOSIS — M6281 Muscle weakness (generalized): Secondary | ICD-10-CM

## 2020-09-12 DIAGNOSIS — M62838 Other muscle spasm: Secondary | ICD-10-CM

## 2020-09-12 DIAGNOSIS — S069X0A Unspecified intracranial injury without loss of consciousness, initial encounter: Secondary | ICD-10-CM

## 2020-09-12 DIAGNOSIS — M25661 Stiffness of right knee, not elsewhere classified: Secondary | ICD-10-CM

## 2020-09-12 DIAGNOSIS — M25561 Pain in right knee: Secondary | ICD-10-CM

## 2020-09-12 DIAGNOSIS — R600 Localized edema: Secondary | ICD-10-CM

## 2020-09-12 DIAGNOSIS — R41841 Cognitive communication deficit: Secondary | ICD-10-CM

## 2020-09-12 DIAGNOSIS — R4701 Aphasia: Secondary | ICD-10-CM

## 2020-09-12 DIAGNOSIS — M545 Low back pain, unspecified: Secondary | ICD-10-CM
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
## 2020-09-15 ENCOUNTER — Other Ambulatory Visit: Payer: Self-pay

## 2020-09-15 ENCOUNTER — Encounter: Payer: Managed Care, Other (non HMO) | Admitting: Occupational Therapy

## 2020-09-15 ENCOUNTER — Ambulatory Visit: Payer: Managed Care, Other (non HMO) | Admitting: Speech Pathology

## 2020-09-15 ENCOUNTER — Other Ambulatory Visit: Payer: Self-pay | Admitting: Registered Nurse

## 2020-09-15 ENCOUNTER — Ambulatory Visit: Payer: Managed Care, Other (non HMO)

## 2020-09-15 ENCOUNTER — Encounter: Payer: Self-pay | Admitting: Speech Pathology

## 2020-09-15 DIAGNOSIS — M6281 Muscle weakness (generalized): Secondary | ICD-10-CM

## 2020-09-15 DIAGNOSIS — M25661 Stiffness of right knee, not elsewhere classified: Secondary | ICD-10-CM

## 2020-09-15 DIAGNOSIS — R4701 Aphasia: Secondary | ICD-10-CM

## 2020-09-15 DIAGNOSIS — S069X0A Unspecified intracranial injury without loss of consciousness, initial encounter: Secondary | ICD-10-CM

## 2020-09-15 DIAGNOSIS — R41841 Cognitive communication deficit: Secondary | ICD-10-CM

## 2020-09-15 DIAGNOSIS — R600 Localized edema: Secondary | ICD-10-CM

## 2020-09-15 DIAGNOSIS — M25561 Pain in right knee: Secondary | ICD-10-CM

## 2020-09-15 DIAGNOSIS — M62838 Other muscle spasm: Secondary | ICD-10-CM

## 2020-09-15 DIAGNOSIS — M545 Low back pain, unspecified: Secondary | ICD-10-CM
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
## 2020-09-16 MED ORDER — OLANZAPINE 5 MG PO TABS
2.5000 mg | ORAL_TABLET | Freq: Every day | ORAL | 2 refills | Status: DC
Start: 2020-09-16 — End: 2021-03-20

## 2020-09-16 MED ORDER — OLANZAPINE 10 MG PO TABS: 10.0000 mg | ORAL_TABLET | Freq: Every day | ORAL | 3 refills | Status: DC

## 2020-09-16 NOTE — Telephone Encounter (Signed)
RF's for both doses were sent to pharmacy

## 2020-09-17 ENCOUNTER — Ambulatory Visit: Payer: Managed Care, Other (non HMO) | Admitting: Speech Pathology

## 2020-09-17 ENCOUNTER — Other Ambulatory Visit: Payer: Self-pay

## 2020-09-17 ENCOUNTER — Encounter: Payer: Self-pay | Admitting: Speech Pathology

## 2020-09-17 DIAGNOSIS — R41841 Cognitive communication deficit: Secondary | ICD-10-CM

## 2020-09-17 DIAGNOSIS — R4701 Aphasia: Secondary | ICD-10-CM

## 2020-09-17 DIAGNOSIS — M6281 Muscle weakness (generalized): Secondary | ICD-10-CM | POA: Diagnosis not present
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
## 2020-09-19 ENCOUNTER — Ambulatory Visit: Payer: Managed Care, Other (non HMO)

## 2020-09-19 ENCOUNTER — Other Ambulatory Visit: Payer: Self-pay

## 2020-09-19 ENCOUNTER — Encounter: Payer: Managed Care, Other (non HMO) | Admitting: Occupational Therapy

## 2020-09-19 ENCOUNTER — Ambulatory Visit: Payer: Managed Care, Other (non HMO) | Admitting: Speech Pathology

## 2020-09-19 ENCOUNTER — Encounter: Payer: Self-pay | Admitting: Speech Pathology

## 2020-09-19 DIAGNOSIS — M545 Low back pain, unspecified: Secondary | ICD-10-CM

## 2020-09-19 DIAGNOSIS — M6281 Muscle weakness (generalized): Secondary | ICD-10-CM

## 2020-09-19 DIAGNOSIS — M25561 Pain in right knee: Secondary | ICD-10-CM

## 2020-09-19 DIAGNOSIS — S069X0A Unspecified intracranial injury without loss of consciousness, initial encounter: Secondary | ICD-10-CM

## 2020-09-19 DIAGNOSIS — R29898 Other symptoms and signs involving the musculoskeletal system: Secondary | ICD-10-CM

## 2020-09-19 DIAGNOSIS — R41841 Cognitive communication deficit: Secondary | ICD-10-CM

## 2020-09-19 DIAGNOSIS — R4701 Aphasia: Secondary | ICD-10-CM

## 2020-09-19 DIAGNOSIS — R293 Abnormal posture: Secondary | ICD-10-CM

## 2020-09-19 DIAGNOSIS — M62838 Other muscle spasm: Secondary | ICD-10-CM

## 2020-09-19 DIAGNOSIS — R600 Localized edema: Secondary | ICD-10-CM

## 2020-09-19 DIAGNOSIS — M25661 Stiffness of right knee, not elsewhere classified: Secondary | ICD-10-CM
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
## 2020-09-22 ENCOUNTER — Other Ambulatory Visit: Payer: Self-pay

## 2020-09-22 ENCOUNTER — Encounter: Payer: Self-pay | Admitting: Speech Pathology

## 2020-09-22 ENCOUNTER — Ambulatory Visit: Payer: Managed Care, Other (non HMO) | Admitting: Speech Pathology

## 2020-09-22 ENCOUNTER — Encounter: Payer: Managed Care, Other (non HMO) | Admitting: Occupational Therapy

## 2020-09-22 ENCOUNTER — Ambulatory Visit: Payer: Managed Care, Other (non HMO)

## 2020-09-22 DIAGNOSIS — R29898 Other symptoms and signs involving the musculoskeletal system: Secondary | ICD-10-CM

## 2020-09-22 DIAGNOSIS — M25561 Pain in right knee: Secondary | ICD-10-CM

## 2020-09-22 DIAGNOSIS — R41841 Cognitive communication deficit: Secondary | ICD-10-CM

## 2020-09-22 DIAGNOSIS — R600 Localized edema: Secondary | ICD-10-CM

## 2020-09-22 DIAGNOSIS — R293 Abnormal posture: Secondary | ICD-10-CM

## 2020-09-22 DIAGNOSIS — M62838 Other muscle spasm: Secondary | ICD-10-CM

## 2020-09-22 DIAGNOSIS — M25661 Stiffness of right knee, not elsewhere classified: Secondary | ICD-10-CM

## 2020-09-22 DIAGNOSIS — S069X0A Unspecified intracranial injury without loss of consciousness, initial encounter: Secondary | ICD-10-CM

## 2020-09-22 DIAGNOSIS — M6281 Muscle weakness (generalized): Secondary | ICD-10-CM

## 2020-09-22 DIAGNOSIS — R4701 Aphasia: Secondary | ICD-10-CM

## 2020-09-22 DIAGNOSIS — M545 Low back pain, unspecified: Secondary | ICD-10-CM
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
## 2020-09-24 ENCOUNTER — Ambulatory Visit: Payer: Managed Care, Other (non HMO)

## 2020-09-24 ENCOUNTER — Ambulatory Visit: Payer: Managed Care, Other (non HMO) | Admitting: Speech Pathology

## 2020-09-24 ENCOUNTER — Encounter: Payer: Self-pay | Admitting: Speech Pathology

## 2020-09-24 ENCOUNTER — Other Ambulatory Visit: Payer: Self-pay

## 2020-09-24 DIAGNOSIS — R4701 Aphasia: Secondary | ICD-10-CM

## 2020-09-24 DIAGNOSIS — R41841 Cognitive communication deficit: Secondary | ICD-10-CM

## 2020-09-24 DIAGNOSIS — M6281 Muscle weakness (generalized): Secondary | ICD-10-CM | POA: Diagnosis not present
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
## 2020-09-26 ENCOUNTER — Encounter: Payer: Self-pay | Admitting: Speech Pathology

## 2020-09-26 ENCOUNTER — Ambulatory Visit: Payer: Managed Care, Other (non HMO) | Admitting: Speech Pathology

## 2020-09-26 ENCOUNTER — Other Ambulatory Visit: Payer: Self-pay

## 2020-09-26 ENCOUNTER — Encounter: Payer: Managed Care, Other (non HMO) | Admitting: Occupational Therapy

## 2020-09-26 DIAGNOSIS — R41841 Cognitive communication deficit: Secondary | ICD-10-CM

## 2020-09-26 DIAGNOSIS — R4701 Aphasia: Secondary | ICD-10-CM

## 2020-09-26 DIAGNOSIS — M6281 Muscle weakness (generalized): Secondary | ICD-10-CM | POA: Diagnosis not present
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
## 2020-09-30 ENCOUNTER — Other Ambulatory Visit: Payer: Self-pay | Admitting: Physical Medicine & Rehabilitation

## 2020-10-01 ENCOUNTER — Encounter: Payer: Self-pay | Admitting: Speech Pathology

## 2020-10-01 ENCOUNTER — Encounter: Payer: Managed Care, Other (non HMO) | Admitting: Occupational Therapy

## 2020-10-01 ENCOUNTER — Other Ambulatory Visit: Payer: Self-pay

## 2020-10-01 ENCOUNTER — Ambulatory Visit: Payer: Managed Care, Other (non HMO) | Admitting: Speech Pathology

## 2020-10-01 ENCOUNTER — Ambulatory Visit: Payer: Managed Care, Other (non HMO) | Attending: Physician Assistant

## 2020-10-01 DIAGNOSIS — M25561 Pain in right knee: Secondary | ICD-10-CM | POA: Insufficient documentation

## 2020-10-01 DIAGNOSIS — S069X0A Unspecified intracranial injury without loss of consciousness, initial encounter: Secondary | ICD-10-CM | POA: Insufficient documentation

## 2020-10-01 DIAGNOSIS — R4701 Aphasia: Secondary | ICD-10-CM | POA: Diagnosis present

## 2020-10-01 DIAGNOSIS — M545 Low back pain, unspecified: Secondary | ICD-10-CM | POA: Diagnosis present

## 2020-10-01 DIAGNOSIS — R600 Localized edema: Secondary | ICD-10-CM | POA: Diagnosis present

## 2020-10-01 DIAGNOSIS — R41841 Cognitive communication deficit: Secondary | ICD-10-CM | POA: Insufficient documentation

## 2020-10-01 DIAGNOSIS — M62838 Other muscle spasm: Secondary | ICD-10-CM | POA: Insufficient documentation

## 2020-10-01 DIAGNOSIS — M25661 Stiffness of right knee, not elsewhere classified: Secondary | ICD-10-CM | POA: Insufficient documentation

## 2020-10-01 DIAGNOSIS — M6281 Muscle weakness (generalized): Secondary | ICD-10-CM
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
## 2020-10-02 ENCOUNTER — Ambulatory Visit: Payer: Managed Care, Other (non HMO) | Admitting: Speech Pathology

## 2020-10-02 ENCOUNTER — Encounter: Payer: Self-pay | Admitting: Speech Pathology

## 2020-10-02 DIAGNOSIS — M6281 Muscle weakness (generalized): Secondary | ICD-10-CM | POA: Diagnosis not present

## 2020-10-02 DIAGNOSIS — R41841 Cognitive communication deficit: Secondary | ICD-10-CM
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
## 2020-10-03 ENCOUNTER — Ambulatory Visit: Payer: Managed Care, Other (non HMO)

## 2020-10-03 ENCOUNTER — Encounter: Payer: Managed Care, Other (non HMO) | Admitting: Occupational Therapy

## 2020-10-07 ENCOUNTER — Ambulatory Visit: Payer: Managed Care, Other (non HMO) | Admitting: Speech Pathology

## 2020-10-07 ENCOUNTER — Other Ambulatory Visit: Payer: Self-pay

## 2020-10-07 ENCOUNTER — Encounter: Payer: Self-pay | Admitting: Speech Pathology

## 2020-10-07 DIAGNOSIS — R4701 Aphasia: Secondary | ICD-10-CM

## 2020-10-07 DIAGNOSIS — R41841 Cognitive communication deficit: Secondary | ICD-10-CM

## 2020-10-07 DIAGNOSIS — M6281 Muscle weakness (generalized): Secondary | ICD-10-CM | POA: Diagnosis not present
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
## 2020-10-09 ENCOUNTER — Other Ambulatory Visit: Payer: Self-pay

## 2020-10-09 ENCOUNTER — Ambulatory Visit: Payer: Managed Care, Other (non HMO) | Admitting: Speech Pathology

## 2020-10-09 ENCOUNTER — Encounter: Payer: Self-pay | Admitting: Speech Pathology

## 2020-10-09 DIAGNOSIS — M6281 Muscle weakness (generalized): Secondary | ICD-10-CM | POA: Diagnosis not present

## 2020-10-09 DIAGNOSIS — R4701 Aphasia: Secondary | ICD-10-CM

## 2020-10-09 DIAGNOSIS — R41841 Cognitive communication deficit: Secondary | ICD-10-CM
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
## 2020-10-10 ENCOUNTER — Ambulatory Visit: Payer: Managed Care, Other (non HMO)

## 2020-10-10 ENCOUNTER — Encounter: Payer: Self-pay | Admitting: Speech Pathology

## 2020-10-10 ENCOUNTER — Ambulatory Visit: Payer: Managed Care, Other (non HMO) | Admitting: Speech Pathology

## 2020-10-13 ENCOUNTER — Ambulatory Visit: Payer: Managed Care, Other (non HMO) | Admitting: Speech Pathology

## 2020-10-13 ENCOUNTER — Encounter: Payer: Self-pay | Admitting: Speech Pathology

## 2020-10-13 ENCOUNTER — Other Ambulatory Visit: Payer: Self-pay

## 2020-10-13 DIAGNOSIS — R4701 Aphasia: Secondary | ICD-10-CM

## 2020-10-13 DIAGNOSIS — R41841 Cognitive communication deficit: Secondary | ICD-10-CM

## 2020-10-13 DIAGNOSIS — M6281 Muscle weakness (generalized): Secondary | ICD-10-CM | POA: Diagnosis not present
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
## 2020-10-14 ENCOUNTER — Ambulatory Visit: Payer: Managed Care, Other (non HMO)

## 2020-10-14 ENCOUNTER — Ambulatory Visit: Payer: Managed Care, Other (non HMO) | Admitting: Speech Pathology

## 2020-10-14 ENCOUNTER — Encounter: Payer: Self-pay | Admitting: Speech Pathology

## 2020-10-14 DIAGNOSIS — M25661 Stiffness of right knee, not elsewhere classified: Secondary | ICD-10-CM

## 2020-10-14 DIAGNOSIS — M62838 Other muscle spasm: Secondary | ICD-10-CM

## 2020-10-14 DIAGNOSIS — M6281 Muscle weakness (generalized): Secondary | ICD-10-CM

## 2020-10-14 DIAGNOSIS — S069X0A Unspecified intracranial injury without loss of consciousness, initial encounter: Secondary | ICD-10-CM

## 2020-10-14 DIAGNOSIS — R4701 Aphasia: Secondary | ICD-10-CM

## 2020-10-14 DIAGNOSIS — R41841 Cognitive communication deficit: Secondary | ICD-10-CM

## 2020-10-14 DIAGNOSIS — M25561 Pain in right knee: Secondary | ICD-10-CM

## 2020-10-14 DIAGNOSIS — M545 Low back pain, unspecified: Secondary | ICD-10-CM

## 2020-10-14 DIAGNOSIS — R600 Localized edema: Secondary | ICD-10-CM
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
## 2020-10-16 ENCOUNTER — Other Ambulatory Visit: Payer: Self-pay

## 2020-10-16 ENCOUNTER — Ambulatory Visit: Payer: Managed Care, Other (non HMO) | Admitting: Speech Pathology

## 2020-10-16 ENCOUNTER — Encounter: Payer: Self-pay | Admitting: Speech Pathology

## 2020-10-16 DIAGNOSIS — R41841 Cognitive communication deficit: Secondary | ICD-10-CM

## 2020-10-16 DIAGNOSIS — M6281 Muscle weakness (generalized): Secondary | ICD-10-CM | POA: Diagnosis not present

## 2020-10-16 DIAGNOSIS — R4701 Aphasia: Secondary | ICD-10-CM
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
## 2020-10-20 ENCOUNTER — Other Ambulatory Visit: Payer: Self-pay

## 2020-10-20 ENCOUNTER — Encounter: Payer: Self-pay | Admitting: Speech Pathology

## 2020-10-20 ENCOUNTER — Ambulatory Visit: Payer: Managed Care, Other (non HMO)

## 2020-10-20 ENCOUNTER — Ambulatory Visit: Payer: Managed Care, Other (non HMO) | Admitting: Speech Pathology

## 2020-10-20 DIAGNOSIS — M62838 Other muscle spasm: Secondary | ICD-10-CM

## 2020-10-20 DIAGNOSIS — M25561 Pain in right knee: Secondary | ICD-10-CM

## 2020-10-20 DIAGNOSIS — R41841 Cognitive communication deficit: Secondary | ICD-10-CM

## 2020-10-20 DIAGNOSIS — M25661 Stiffness of right knee, not elsewhere classified: Secondary | ICD-10-CM

## 2020-10-20 DIAGNOSIS — M6281 Muscle weakness (generalized): Secondary | ICD-10-CM | POA: Diagnosis not present

## 2020-10-20 DIAGNOSIS — R600 Localized edema: Secondary | ICD-10-CM

## 2020-10-20 DIAGNOSIS — M545 Low back pain, unspecified: Secondary | ICD-10-CM

## 2020-10-20 DIAGNOSIS — R4701 Aphasia: Secondary | ICD-10-CM

## 2020-10-20 DIAGNOSIS — S069X0A Unspecified intracranial injury without loss of consciousness, initial encounter: Secondary | ICD-10-CM

## 2020-10-20 NOTE — Patient Instructions (Signed)
Access Code: [email or web address] URL: [email or web address] Date: 10/20/2020 Prepared by: Sherlynn Stalls  Program Notes Tread mill: 1.4 mph for 10 minutes Stairs: walk with one foot after the other    Exercises Supine Lower Trunk Rotation - 1 x daily - 4 x weekly - 2 sets - 10 reps Supine March - 1 x daily - 4 x weekly - 2 sets - 10 reps Bridge with Arms at [employer] and Feet on [employer] - 1 x daily - 7 x weekly - 3 sets - 10 reps Bird Dog - 1 x daily - 7 x weekly - 3 sets - 10 reps
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
## 2020-10-22 ENCOUNTER — Encounter: Payer: Self-pay | Admitting: Speech Pathology

## 2020-10-22 ENCOUNTER — Ambulatory Visit: Payer: Managed Care, Other (non HMO) | Admitting: Speech Pathology

## 2020-10-22 ENCOUNTER — Other Ambulatory Visit: Payer: Self-pay

## 2020-10-22 DIAGNOSIS — R4701 Aphasia: Secondary | ICD-10-CM

## 2020-10-22 DIAGNOSIS — M6281 Muscle weakness (generalized): Secondary | ICD-10-CM | POA: Diagnosis not present

## 2020-10-22 DIAGNOSIS — R41841 Cognitive communication deficit: Secondary | ICD-10-CM
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
## 2020-10-23 ENCOUNTER — Telehealth: Payer: Self-pay | Admitting: Registered Nurse

## 2020-10-23 DIAGNOSIS — S062X4S Diffuse traumatic brain injury with loss of consciousness of 6 hours to 24 hours, sequela: Secondary | ICD-10-CM

## 2020-10-23 DIAGNOSIS — S82201S Unspecified fracture of shaft of right tibia, sequela: Secondary | ICD-10-CM

## 2020-10-23 MED ORDER — CITALOPRAM HYDROBROMIDE 10 MG PO TABS: 30.0000 mg | ORAL_TABLET | Freq: Every day | ORAL | 1 refills | Status: DC

## 2020-10-23 MED ORDER — METHYLPHENIDATE HCL 20 MG PO TABS
20.0000 mg | ORAL_TABLET | Freq: Two times a day (BID) | ORAL | 0 refills | Status: DC
Start: 2020-10-23 — End: 2020-10-29

## 2020-10-23 NOTE — Telephone Encounter (Signed)
PMP was reviewed: Medication e-scribed today. Sent message to Ms. Lorelle Formosa via My- Chart today regarding the above.

## 2020-10-24 ENCOUNTER — Ambulatory Visit: Payer: Managed Care, Other (non HMO) | Admitting: Speech Pathology

## 2020-10-24 ENCOUNTER — Encounter: Payer: Self-pay | Admitting: Speech Pathology

## 2020-10-24 ENCOUNTER — Other Ambulatory Visit: Payer: Self-pay

## 2020-10-24 DIAGNOSIS — R41841 Cognitive communication deficit: Secondary | ICD-10-CM

## 2020-10-24 DIAGNOSIS — M6281 Muscle weakness (generalized): Secondary | ICD-10-CM | POA: Diagnosis not present

## 2020-10-24 DIAGNOSIS — R4701 Aphasia: Secondary | ICD-10-CM
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
## 2020-10-27 ENCOUNTER — Encounter: Payer: Self-pay | Admitting: Speech Pathology

## 2020-10-27 ENCOUNTER — Ambulatory Visit: Payer: Managed Care, Other (non HMO) | Admitting: Speech Pathology

## 2020-10-27 ENCOUNTER — Other Ambulatory Visit: Payer: Self-pay

## 2020-10-27 DIAGNOSIS — R4701 Aphasia: Secondary | ICD-10-CM

## 2020-10-27 DIAGNOSIS — R41841 Cognitive communication deficit: Secondary | ICD-10-CM

## 2020-10-27 DIAGNOSIS — M6281 Muscle weakness (generalized): Secondary | ICD-10-CM | POA: Diagnosis not present
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
## 2020-10-28 ENCOUNTER — Ambulatory Visit: Payer: Managed Care, Other (non HMO) | Admitting: Speech Pathology

## 2020-10-28 ENCOUNTER — Encounter: Payer: Self-pay | Admitting: Physical Therapy

## 2020-10-28 ENCOUNTER — Encounter: Payer: Self-pay | Admitting: Speech Pathology

## 2020-10-28 ENCOUNTER — Ambulatory Visit: Payer: Managed Care, Other (non HMO) | Admitting: Physical Therapy

## 2020-10-28 DIAGNOSIS — M25661 Stiffness of right knee, not elsewhere classified: Secondary | ICD-10-CM

## 2020-10-28 DIAGNOSIS — M62838 Other muscle spasm: Secondary | ICD-10-CM

## 2020-10-28 DIAGNOSIS — M6281 Muscle weakness (generalized): Secondary | ICD-10-CM

## 2020-10-28 DIAGNOSIS — R41841 Cognitive communication deficit: Secondary | ICD-10-CM

## 2020-10-28 DIAGNOSIS — R4701 Aphasia: Secondary | ICD-10-CM

## 2020-10-28 DIAGNOSIS — M25561 Pain in right knee: Secondary | ICD-10-CM

## 2020-10-28 DIAGNOSIS — M545 Low back pain, unspecified: Secondary | ICD-10-CM
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
## 2020-10-29 ENCOUNTER — Other Ambulatory Visit: Payer: Self-pay

## 2020-10-29 ENCOUNTER — Encounter: Payer: Self-pay | Admitting: Physical Medicine & Rehabilitation

## 2020-10-29 ENCOUNTER — Encounter: Payer: Managed Care, Other (non HMO) | Attending: Registered Nurse | Admitting: Physical Medicine & Rehabilitation

## 2020-10-29 VITALS — BP 109/76 | HR 86 | Temp 98.6°F | Ht 70.0 in | Wt 220.0 lb

## 2020-10-29 DIAGNOSIS — S062X4S Diffuse traumatic brain injury with loss of consciousness of 6 hours to 24 hours, sequela: Secondary | ICD-10-CM | POA: Diagnosis not present

## 2020-10-29 DIAGNOSIS — G479 Sleep disorder, unspecified: Secondary | ICD-10-CM | POA: Diagnosis not present

## 2020-10-29 DIAGNOSIS — S82401S Unspecified fracture of shaft of right fibula, sequela: Secondary | ICD-10-CM | POA: Diagnosis not present

## 2020-10-29 DIAGNOSIS — S82201S Unspecified fracture of shaft of right tibia, sequela: Secondary | ICD-10-CM | POA: Diagnosis present

## 2020-10-29 MED ORDER — METHYLPHENIDATE HCL ER (LA) 40 MG PO CP24
40.0000 mg | ORAL_CAPSULE | Freq: Every day | ORAL | 0 refills | Status: DC
Start: 2020-10-29 — End: 2020-11-17

## 2020-10-29 NOTE — Patient Instructions (Addendum)
PLEASE FEEL FREE TO CALL OUR OFFICE WITH ANY PROBLEMS OR QUESTIONS ([phone])   WORK ON A SCHEDULE AND ROUTINE AT HOME. STICK TO IT EVERY DAY.  USE YOUR PHONE IF NEEDED
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
## 2020-10-30 ENCOUNTER — Ambulatory Visit: Payer: Managed Care, Other (non HMO) | Admitting: Speech Pathology

## 2020-10-31 ENCOUNTER — Other Ambulatory Visit: Payer: Self-pay

## 2020-10-31 ENCOUNTER — Ambulatory Visit: Payer: 59 | Attending: Physician Assistant | Admitting: Speech Pathology

## 2020-10-31 ENCOUNTER — Encounter: Payer: Self-pay | Admitting: Speech Pathology

## 2020-10-31 DIAGNOSIS — R41841 Cognitive communication deficit: Secondary | ICD-10-CM | POA: Diagnosis present

## 2020-10-31 DIAGNOSIS — R600 Localized edema: Secondary | ICD-10-CM | POA: Diagnosis present

## 2020-10-31 DIAGNOSIS — S069X0A Unspecified intracranial injury without loss of consciousness, initial encounter: Secondary | ICD-10-CM | POA: Diagnosis present

## 2020-10-31 DIAGNOSIS — M6281 Muscle weakness (generalized): Secondary | ICD-10-CM | POA: Diagnosis present

## 2020-10-31 DIAGNOSIS — M25561 Pain in right knee: Secondary | ICD-10-CM | POA: Diagnosis present

## 2020-10-31 DIAGNOSIS — M62838 Other muscle spasm: Secondary | ICD-10-CM | POA: Diagnosis present

## 2020-10-31 DIAGNOSIS — M25661 Stiffness of right knee, not elsewhere classified: Secondary | ICD-10-CM | POA: Insufficient documentation

## 2020-10-31 DIAGNOSIS — R4701 Aphasia: Secondary | ICD-10-CM | POA: Diagnosis present

## 2020-10-31 DIAGNOSIS — M545 Low back pain, unspecified: Secondary | ICD-10-CM | POA: Insufficient documentation
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
## 2020-11-04 ENCOUNTER — Ambulatory Visit: Payer: 59 | Admitting: Speech Pathology

## 2020-11-06 ENCOUNTER — Encounter: Payer: Managed Care, Other (non HMO) | Admitting: Speech Pathology

## 2020-11-06 ENCOUNTER — Ambulatory Visit: Payer: 59

## 2020-11-06 ENCOUNTER — Ambulatory Visit: Payer: 59 | Admitting: Speech Pathology

## 2020-11-07 ENCOUNTER — Encounter: Payer: Managed Care, Other (non HMO) | Admitting: Speech Pathology

## 2020-11-10 ENCOUNTER — Ambulatory Visit: Payer: 59 | Admitting: Speech Pathology

## 2020-11-10 ENCOUNTER — Other Ambulatory Visit: Payer: Self-pay

## 2020-11-10 ENCOUNTER — Ambulatory Visit: Payer: 59

## 2020-11-10 DIAGNOSIS — R41841 Cognitive communication deficit: Secondary | ICD-10-CM

## 2020-11-10 DIAGNOSIS — M6281 Muscle weakness (generalized): Secondary | ICD-10-CM

## 2020-11-10 DIAGNOSIS — M25561 Pain in right knee: Secondary | ICD-10-CM

## 2020-11-10 DIAGNOSIS — R4701 Aphasia: Secondary | ICD-10-CM

## 2020-11-10 DIAGNOSIS — M545 Low back pain, unspecified: Secondary | ICD-10-CM

## 2020-11-10 DIAGNOSIS — M62838 Other muscle spasm: Secondary | ICD-10-CM

## 2020-11-10 DIAGNOSIS — R600 Localized edema: Secondary | ICD-10-CM

## 2020-11-10 DIAGNOSIS — M25661 Stiffness of right knee, not elsewhere classified: Secondary | ICD-10-CM

## 2020-11-10 DIAGNOSIS — S069X0A Unspecified intracranial injury without loss of consciousness, initial encounter: Secondary | ICD-10-CM
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
## 2020-11-11 ENCOUNTER — Encounter: Payer: Self-pay | Admitting: Speech Pathology
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
## 2020-11-13 ENCOUNTER — Ambulatory Visit: Payer: 59 | Admitting: Speech Pathology

## 2020-11-17 ENCOUNTER — Ambulatory Visit: Payer: 59 | Admitting: Speech Pathology

## 2020-11-17 ENCOUNTER — Ambulatory Visit: Payer: 59

## 2020-11-17 ENCOUNTER — Other Ambulatory Visit: Payer: Self-pay

## 2020-11-17 ENCOUNTER — Encounter: Payer: Self-pay | Admitting: Speech Pathology

## 2020-11-17 DIAGNOSIS — R4701 Aphasia: Secondary | ICD-10-CM | POA: Diagnosis not present

## 2020-11-17 DIAGNOSIS — R600 Localized edema: Secondary | ICD-10-CM

## 2020-11-17 DIAGNOSIS — S069X0A Unspecified intracranial injury without loss of consciousness, initial encounter: Secondary | ICD-10-CM

## 2020-11-17 DIAGNOSIS — M62838 Other muscle spasm: Secondary | ICD-10-CM

## 2020-11-17 DIAGNOSIS — M545 Low back pain, unspecified: Secondary | ICD-10-CM

## 2020-11-17 DIAGNOSIS — M25561 Pain in right knee: Secondary | ICD-10-CM

## 2020-11-17 DIAGNOSIS — R41841 Cognitive communication deficit: Secondary | ICD-10-CM

## 2020-11-17 DIAGNOSIS — M6281 Muscle weakness (generalized): Secondary | ICD-10-CM

## 2020-11-17 DIAGNOSIS — M25661 Stiffness of right knee, not elsewhere classified: Secondary | ICD-10-CM
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
## 2020-11-17 NOTE — Patient Instructions (Signed)
Access Code: [email or web address] URL: [email or web address] Date: 11/17/2020 Prepared by: Sherlynn Stalls  Exercises Supine Lower Trunk Rotation - 1 x daily - 4 x weekly - 2 sets - 10 reps Bridge with Arms at [employer] and Feet on [employer] - 1 x daily - 7 x weekly - 3 sets - 10 reps Bird Dog - 1 x daily - 7 x weekly - 5 sets - 5 reps Marching Bridge - 1 x daily - 7 x weekly - 5 sets - 5 reps Full Plank on Knees - 1 x daily - 7 x weekly - 5 sets - 30 seconds hold Walking on Treadmill - 7 x weekly - 10-15 minutes hold

## 2020-11-18 ENCOUNTER — Ambulatory Visit: Payer: 59 | Admitting: Speech Pathology

## 2020-11-18 ENCOUNTER — Encounter: Payer: Self-pay | Admitting: Speech Pathology

## 2020-11-18 DIAGNOSIS — R4701 Aphasia: Secondary | ICD-10-CM

## 2020-11-18 DIAGNOSIS — R41841 Cognitive communication deficit: Secondary | ICD-10-CM

## 2020-11-18 MED ORDER — METHYLPHENIDATE HCL ER (OSM) 36 MG PO TBCR: 36.0000 mg | EXTENDED_RELEASE_TABLET | Freq: Every day | ORAL | 0 refills | Status: DC
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
## 2020-11-18 NOTE — Telephone Encounter (Signed)
RX for concerta 36 sent to [facility] on [address]

## 2020-11-25 ENCOUNTER — Ambulatory Visit: Payer: 59 | Admitting: Speech Pathology

## 2020-11-25 ENCOUNTER — Other Ambulatory Visit: Payer: Self-pay

## 2020-11-25 ENCOUNTER — Encounter: Payer: Self-pay | Admitting: Speech Pathology

## 2020-11-25 DIAGNOSIS — R41841 Cognitive communication deficit: Secondary | ICD-10-CM

## 2020-11-25 DIAGNOSIS — R4701 Aphasia: Secondary | ICD-10-CM
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
## 2020-11-27 ENCOUNTER — Other Ambulatory Visit: Payer: Self-pay

## 2020-11-27 ENCOUNTER — Encounter: Payer: Self-pay | Admitting: Speech Pathology

## 2020-11-27 ENCOUNTER — Ambulatory Visit: Payer: 59 | Admitting: Speech Pathology

## 2020-11-27 DIAGNOSIS — R4701 Aphasia: Secondary | ICD-10-CM

## 2020-11-27 DIAGNOSIS — R41841 Cognitive communication deficit: Secondary | ICD-10-CM
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
## 2020-12-02 ENCOUNTER — Encounter: Payer: Self-pay | Admitting: Speech Pathology

## 2020-12-02 ENCOUNTER — Other Ambulatory Visit: Payer: Self-pay

## 2020-12-02 ENCOUNTER — Ambulatory Visit: Payer: 59 | Attending: Physician Assistant | Admitting: Speech Pathology

## 2020-12-02 DIAGNOSIS — R41841 Cognitive communication deficit: Secondary | ICD-10-CM | POA: Diagnosis present

## 2020-12-02 DIAGNOSIS — R4701 Aphasia: Secondary | ICD-10-CM | POA: Diagnosis present
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
## 2020-12-09 ENCOUNTER — Other Ambulatory Visit: Payer: Self-pay

## 2020-12-09 ENCOUNTER — Encounter: Payer: Self-pay | Admitting: Speech Pathology

## 2020-12-09 ENCOUNTER — Ambulatory Visit: Payer: 59 | Admitting: Speech Pathology

## 2020-12-09 DIAGNOSIS — R41841 Cognitive communication deficit: Secondary | ICD-10-CM

## 2020-12-09 DIAGNOSIS — R4701 Aphasia: Secondary | ICD-10-CM | POA: Diagnosis not present
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
## 2020-12-14 ENCOUNTER — Other Ambulatory Visit: Payer: Self-pay | Admitting: Registered Nurse

## 2020-12-14 ENCOUNTER — Other Ambulatory Visit: Payer: Self-pay | Admitting: Physical Medicine & Rehabilitation

## 2020-12-15 MED ORDER — METHYLPHENIDATE HCL ER (OSM) 36 MG PO TBCR: 36.0000 mg | EXTENDED_RELEASE_TABLET | Freq: Every day | ORAL | 0 refills | Status: DC

## 2020-12-16 ENCOUNTER — Encounter: Payer: Self-pay | Admitting: Speech Pathology

## 2020-12-16 ENCOUNTER — Other Ambulatory Visit: Payer: Self-pay

## 2020-12-16 ENCOUNTER — Ambulatory Visit: Payer: 59 | Admitting: Speech Pathology

## 2020-12-16 DIAGNOSIS — R41841 Cognitive communication deficit: Secondary | ICD-10-CM

## 2020-12-16 DIAGNOSIS — R4701 Aphasia: Secondary | ICD-10-CM | POA: Diagnosis not present
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
## 2020-12-23 ENCOUNTER — Ambulatory Visit: Payer: 59 | Admitting: Speech Pathology

## 2020-12-23 ENCOUNTER — Other Ambulatory Visit: Payer: Self-pay

## 2020-12-23 ENCOUNTER — Encounter: Payer: Self-pay | Admitting: Speech Pathology

## 2020-12-23 DIAGNOSIS — R41841 Cognitive communication deficit: Secondary | ICD-10-CM

## 2020-12-23 DIAGNOSIS — R4701 Aphasia: Secondary | ICD-10-CM | POA: Diagnosis not present
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
## 2020-12-29 ENCOUNTER — Telehealth: Payer: Self-pay

## 2020-12-29 NOTE — Telephone Encounter (Signed)
Refill request Divalproex delayed release 250 mg #270. Next appt 01/28/21

## 2020-12-30 MED ORDER — DIVALPROEX SODIUM 500 MG PO DR TAB: DELAYED_RELEASE_TABLET | ORAL | 2 refills | Status: DC

## 2020-12-30 NOTE — Telephone Encounter (Signed)
He is on '500mg'$  depakote, RF sent

## 2021-01-02 LAB — COLOGUARD: COLOGUARD: NEGATIVE

## 2021-01-06 ENCOUNTER — Other Ambulatory Visit: Payer: Self-pay

## 2021-01-06 ENCOUNTER — Encounter: Payer: Self-pay | Admitting: Speech Pathology

## 2021-01-06 ENCOUNTER — Ambulatory Visit: Payer: 59 | Attending: Physician Assistant | Admitting: Speech Pathology

## 2021-01-06 DIAGNOSIS — R41841 Cognitive communication deficit: Secondary | ICD-10-CM | POA: Insufficient documentation

## 2021-01-06 DIAGNOSIS — R4701 Aphasia: Secondary | ICD-10-CM | POA: Diagnosis not present
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
## 2021-01-07 ENCOUNTER — Other Ambulatory Visit: Payer: Self-pay | Admitting: Physical Medicine & Rehabilitation

## 2021-01-13 ENCOUNTER — Encounter: Payer: Self-pay | Admitting: Speech Pathology

## 2021-01-13 ENCOUNTER — Ambulatory Visit: Payer: 59 | Admitting: Speech Pathology

## 2021-01-13 ENCOUNTER — Other Ambulatory Visit: Payer: Self-pay

## 2021-01-13 DIAGNOSIS — R4701 Aphasia: Secondary | ICD-10-CM

## 2021-01-13 DIAGNOSIS — R41841 Cognitive communication deficit: Secondary | ICD-10-CM
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
## 2021-01-20 ENCOUNTER — Ambulatory Visit: Payer: 59 | Admitting: Speech Pathology

## 2021-01-20 ENCOUNTER — Encounter: Payer: Self-pay | Admitting: Speech Pathology

## 2021-01-20 ENCOUNTER — Other Ambulatory Visit: Payer: Self-pay

## 2021-01-20 DIAGNOSIS — R4701 Aphasia: Secondary | ICD-10-CM

## 2021-01-20 DIAGNOSIS — R41841 Cognitive communication deficit: Secondary | ICD-10-CM
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
## 2021-01-27 ENCOUNTER — Encounter: Payer: Self-pay | Admitting: Speech Pathology

## 2021-01-27 ENCOUNTER — Other Ambulatory Visit: Payer: Self-pay

## 2021-01-27 ENCOUNTER — Ambulatory Visit: Payer: 59 | Admitting: Speech Pathology

## 2021-01-27 DIAGNOSIS — R4701 Aphasia: Secondary | ICD-10-CM | POA: Diagnosis not present
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
## 2021-01-28 ENCOUNTER — Other Ambulatory Visit: Payer: Self-pay

## 2021-01-28 ENCOUNTER — Encounter: Payer: 59 | Attending: Physical Medicine & Rehabilitation | Admitting: Physical Medicine & Rehabilitation

## 2021-01-28 ENCOUNTER — Encounter: Payer: Self-pay | Admitting: Physical Medicine & Rehabilitation

## 2021-01-28 VITALS — BP 125/83 | HR 92 | Temp 97.9°F | Ht 69.0 in | Wt 227.8 lb

## 2021-01-28 DIAGNOSIS — R451 Restlessness and agitation: Secondary | ICD-10-CM | POA: Insufficient documentation

## 2021-01-28 DIAGNOSIS — S062X4S Diffuse traumatic brain injury with loss of consciousness of 6 hours to 24 hours, sequela: Secondary | ICD-10-CM | POA: Diagnosis present

## 2021-01-28 DIAGNOSIS — G479 Sleep disorder, unspecified: Secondary | ICD-10-CM | POA: Diagnosis not present

## 2021-01-28 MED ORDER — METHYLPHENIDATE HCL ER (OSM) 54 MG PO TBCR: 54.0000 mg | EXTENDED_RELEASE_TABLET | ORAL | 0 refills | Status: DC
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
## 2021-01-28 NOTE — Patient Instructions (Addendum)
PLEASE FEEL FREE TO CALL OUR OFFICE WITH ANY PROBLEMS OR QUESTIONS ([phone])   THINK ABOUT GOOD GAIT MECHANICS WHEN YOU WALK. TRY TO KEEP YOUR PELVIS BALANCED!!.

## 2021-01-29 MED ORDER — METHYLPHENIDATE HCL ER (OSM) 54 MG PO TBCR: 54.0000 mg | EXTENDED_RELEASE_TABLET | ORAL | 0 refills | Status: DC

## 2021-01-29 NOTE — Addendum Note (Signed)
Addended by: Alger Simons T on: 01/29/2021 12:32 PM   Modules accepted: Orders

## 2021-02-03 ENCOUNTER — Encounter: Payer: Self-pay | Admitting: Speech Pathology

## 2021-02-03 ENCOUNTER — Other Ambulatory Visit: Payer: Self-pay

## 2021-02-03 ENCOUNTER — Ambulatory Visit: Payer: 59 | Attending: Physician Assistant | Admitting: Speech Pathology

## 2021-02-03 DIAGNOSIS — R4701 Aphasia: Secondary | ICD-10-CM | POA: Diagnosis present

## 2021-02-03 DIAGNOSIS — R41841 Cognitive communication deficit: Secondary | ICD-10-CM | POA: Diagnosis present
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
## 2021-02-10 ENCOUNTER — Encounter: Payer: Self-pay | Admitting: Speech Pathology

## 2021-02-10 ENCOUNTER — Other Ambulatory Visit: Payer: Self-pay

## 2021-02-10 ENCOUNTER — Other Ambulatory Visit: Payer: Self-pay | Admitting: Physical Medicine & Rehabilitation

## 2021-02-10 ENCOUNTER — Ambulatory Visit: Payer: 59 | Admitting: Speech Pathology

## 2021-02-10 DIAGNOSIS — R4701 Aphasia: Secondary | ICD-10-CM | POA: Diagnosis not present

## 2021-02-10 DIAGNOSIS — R41841 Cognitive communication deficit: Secondary | ICD-10-CM
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
## 2021-02-17 ENCOUNTER — Other Ambulatory Visit: Payer: Self-pay

## 2021-02-17 ENCOUNTER — Encounter: Payer: Self-pay | Admitting: Speech Pathology

## 2021-02-17 ENCOUNTER — Ambulatory Visit: Payer: 59 | Admitting: Speech Pathology

## 2021-02-17 DIAGNOSIS — R4701 Aphasia: Secondary | ICD-10-CM | POA: Diagnosis not present
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
## 2021-02-24 ENCOUNTER — Other Ambulatory Visit: Payer: Self-pay

## 2021-02-24 ENCOUNTER — Ambulatory Visit: Payer: 59 | Admitting: Speech Pathology

## 2021-02-24 ENCOUNTER — Encounter: Payer: Self-pay | Admitting: Speech Pathology

## 2021-02-24 DIAGNOSIS — R4701 Aphasia: Secondary | ICD-10-CM | POA: Diagnosis not present
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
## 2021-03-03 ENCOUNTER — Ambulatory Visit: Payer: 59 | Admitting: Speech Pathology

## 2021-03-03 ENCOUNTER — Other Ambulatory Visit: Payer: Self-pay

## 2021-03-03 DIAGNOSIS — G479 Sleep disorder, unspecified: Secondary | ICD-10-CM

## 2021-03-03 DIAGNOSIS — S062X4S Diffuse traumatic brain injury with loss of consciousness of 6 hours to 24 hours, sequela: Secondary | ICD-10-CM

## 2021-03-03 DIAGNOSIS — R451 Restlessness and agitation: Secondary | ICD-10-CM

## 2021-03-03 MED ORDER — METHYLPHENIDATE HCL ER (OSM) 54 MG PO TBCR: 54.0000 mg | EXTENDED_RELEASE_TABLET | ORAL | 0 refills | Status: DC

## 2021-03-10 ENCOUNTER — Other Ambulatory Visit: Payer: Self-pay

## 2021-03-10 ENCOUNTER — Ambulatory Visit: Payer: 59 | Attending: Physician Assistant | Admitting: Speech Pathology

## 2021-03-10 DIAGNOSIS — R41841 Cognitive communication deficit: Secondary | ICD-10-CM | POA: Diagnosis present

## 2021-03-10 DIAGNOSIS — R4701 Aphasia: Secondary | ICD-10-CM | POA: Insufficient documentation
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
## 2021-03-11 ENCOUNTER — Encounter: Payer: Self-pay | Admitting: Speech Pathology

## 2021-03-17 ENCOUNTER — Other Ambulatory Visit: Payer: Self-pay

## 2021-03-17 ENCOUNTER — Encounter: Payer: Self-pay | Admitting: Speech Pathology

## 2021-03-17 ENCOUNTER — Ambulatory Visit: Payer: 59 | Admitting: Speech Pathology

## 2021-03-17 DIAGNOSIS — R4701 Aphasia: Secondary | ICD-10-CM | POA: Diagnosis not present

## 2021-03-17 DIAGNOSIS — R41841 Cognitive communication deficit: Secondary | ICD-10-CM
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
## 2021-03-20 ENCOUNTER — Encounter: Payer: Self-pay | Admitting: Physical Medicine & Rehabilitation

## 2021-03-20 MED ORDER — FAMOTIDINE 20 MG PO TABS: 20.0000 mg | ORAL_TABLET | Freq: Two times a day (BID) | ORAL | 11 refills | Status: DC

## 2021-03-20 MED ORDER — OLANZAPINE 5 MG PO TABS: 2.5000 mg | ORAL_TABLET | Freq: Every day | ORAL | 2 refills | Status: DC

## 2021-03-20 MED ORDER — OLANZAPINE 10 MG PO TABS: ORAL_TABLET | ORAL | 3 refills | Status: DC

## 2021-03-20 NOTE — Telephone Encounter (Signed)
All meds refilled

## 2021-03-23 ENCOUNTER — Telehealth: Payer: Self-pay | Admitting: Registered Nurse

## 2021-03-23 NOTE — Telephone Encounter (Signed)
Medication List Reviewed, Call placed to pharmacy. Mr. Adam Bates has medication refills and they have his medications ready awaiting for them to be picked up. Ms Adam Bates was sent a My-Chart message regarding the above.

## 2021-03-24 MED ORDER — OLANZAPINE 5 MG PO TABS: 2.5000 mg | ORAL_TABLET | Freq: Every day | ORAL | 5 refills | Status: DC

## 2021-03-24 MED ORDER — FAMOTIDINE 20 MG PO TABS
20.0000 mg | ORAL_TABLET | Freq: Two times a day (BID) | ORAL | 11 refills | Status: DC
Start: 2021-03-24 — End: 2022-05-05

## 2021-03-24 MED ORDER — OLANZAPINE 10 MG PO TABS: ORAL_TABLET | ORAL | 5 refills | Status: DC

## 2021-03-24 NOTE — Telephone Encounter (Signed)
Meds sent in

## 2021-03-30 ENCOUNTER — Encounter: Payer: Self-pay | Admitting: Physical Medicine & Rehabilitation

## 2021-03-30 DIAGNOSIS — R451 Restlessness and agitation: Secondary | ICD-10-CM

## 2021-03-30 DIAGNOSIS — G479 Sleep disorder, unspecified: Secondary | ICD-10-CM

## 2021-03-30 DIAGNOSIS — S062X4S Diffuse traumatic brain injury with loss of consciousness of 6 hours to 24 hours, sequela: Secondary | ICD-10-CM

## 2021-03-30 MED ORDER — METHYLPHENIDATE HCL ER (OSM) 54 MG PO TBCR: 54.0000 mg | EXTENDED_RELEASE_TABLET | ORAL | 0 refills | Status: DC

## 2021-03-30 MED ORDER — METHYLPHENIDATE HCL ER (OSM) 54 MG PO TBCR
54.0000 mg | EXTENDED_RELEASE_TABLET | ORAL | 0 refills | Status: DC
Start: 2021-03-30 — End: 2021-03-31

## 2021-03-31 ENCOUNTER — Other Ambulatory Visit: Payer: Self-pay

## 2021-03-31 ENCOUNTER — Encounter: Payer: Self-pay | Admitting: Speech Pathology

## 2021-03-31 ENCOUNTER — Ambulatory Visit: Payer: 59 | Admitting: Speech Pathology

## 2021-03-31 DIAGNOSIS — R41841 Cognitive communication deficit: Secondary | ICD-10-CM

## 2021-03-31 DIAGNOSIS — R4701 Aphasia: Secondary | ICD-10-CM

## 2021-03-31 MED ORDER — METHYLPHENIDATE HCL ER (OSM) 54 MG PO TBCR: 54.0000 mg | EXTENDED_RELEASE_TABLET | ORAL | 0 refills | Status: DC

## 2021-03-31 NOTE — Telephone Encounter (Signed)
Rxs sent
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
## 2021-03-31 NOTE — Addendum Note (Signed)
Addended by: Alger Simons T on: 03/31/2021 02:03 PM   Modules accepted: Orders

## 2021-04-06 ENCOUNTER — Other Ambulatory Visit: Payer: Self-pay | Admitting: Registered Nurse

## 2021-04-14 ENCOUNTER — Ambulatory Visit: Payer: 59 | Admitting: Speech Pathology

## 2021-04-19 ENCOUNTER — Other Ambulatory Visit: Payer: Self-pay | Admitting: Physical Medicine & Rehabilitation

## 2021-04-21 ENCOUNTER — Ambulatory Visit: Payer: 59 | Attending: Physician Assistant | Admitting: Speech Pathology

## 2021-04-21 ENCOUNTER — Other Ambulatory Visit: Payer: Self-pay

## 2021-04-21 ENCOUNTER — Encounter: Payer: Self-pay | Admitting: Speech Pathology

## 2021-04-21 DIAGNOSIS — R41841 Cognitive communication deficit: Secondary | ICD-10-CM | POA: Diagnosis present

## 2021-04-21 DIAGNOSIS — R4701 Aphasia: Secondary | ICD-10-CM | POA: Insufficient documentation
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
## 2021-05-27 ENCOUNTER — Other Ambulatory Visit: Payer: Self-pay

## 2021-05-27 ENCOUNTER — Encounter
Payer: Managed Care, Other (non HMO) | Attending: Physical Medicine & Rehabilitation | Admitting: Physical Medicine & Rehabilitation

## 2021-05-27 ENCOUNTER — Encounter: Payer: Self-pay | Admitting: Physical Medicine & Rehabilitation

## 2021-05-27 VITALS — BP 122/82 | HR 78 | Temp 98.0°F | Ht 69.0 in | Wt 236.0 lb

## 2021-05-27 DIAGNOSIS — S062X4S Diffuse traumatic brain injury with loss of consciousness of 6 hours to 24 hours, sequela: Secondary | ICD-10-CM | POA: Diagnosis not present

## 2021-05-27 DIAGNOSIS — F329 Major depressive disorder, single episode, unspecified: Secondary | ICD-10-CM | POA: Insufficient documentation

## 2021-05-27 DIAGNOSIS — G479 Sleep disorder, unspecified: Secondary | ICD-10-CM | POA: Insufficient documentation

## 2021-05-27 DIAGNOSIS — R451 Restlessness and agitation: Secondary | ICD-10-CM | POA: Diagnosis not present

## 2021-05-27 MED ORDER — METHYLPHENIDATE HCL ER (OSM) 54 MG PO TBCR: 54.0000 mg | EXTENDED_RELEASE_TABLET | ORAL | 0 refills | Status: DC

## 2021-05-27 MED ORDER — CITALOPRAM HYDROBROMIDE 40 MG PO TABS: 40.0000 mg | ORAL_TABLET | Freq: Every day | ORAL | 5 refills | Status: DC
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
## 2021-05-27 NOTE — Patient Instructions (Addendum)
CALM SOUNDS, MUSIC AT NIGHT TO HELP YOU SLEEP!

## 2021-06-01 ENCOUNTER — Encounter: Payer: Self-pay | Admitting: Physical Medicine & Rehabilitation

## 2021-06-01 DIAGNOSIS — R451 Restlessness and agitation: Secondary | ICD-10-CM

## 2021-06-01 DIAGNOSIS — G479 Sleep disorder, unspecified: Secondary | ICD-10-CM

## 2021-06-01 DIAGNOSIS — S062X4S Diffuse traumatic brain injury with loss of consciousness of 6 hours to 24 hours, sequela: Secondary | ICD-10-CM

## 2021-06-02 MED ORDER — METHYLPHENIDATE HCL ER (OSM) 54 MG PO TBCR: 54.0000 mg | EXTENDED_RELEASE_TABLET | ORAL | 0 refills | Status: DC

## 2021-06-02 NOTE — Telephone Encounter (Signed)
Concerta sent to [facility]

## 2021-06-05 ENCOUNTER — Encounter: Payer: Self-pay | Admitting: Physical Medicine & Rehabilitation

## 2021-06-08 ENCOUNTER — Encounter: Payer: Self-pay | Admitting: Physical Medicine & Rehabilitation

## 2021-06-30 ENCOUNTER — Encounter: Payer: Self-pay | Admitting: Physical Medicine & Rehabilitation

## 2021-06-30 DIAGNOSIS — S062X4S Diffuse traumatic brain injury with loss of consciousness of 6 hours to 24 hours, sequela: Secondary | ICD-10-CM

## 2021-06-30 DIAGNOSIS — G479 Sleep disorder, unspecified: Secondary | ICD-10-CM

## 2021-06-30 DIAGNOSIS — R451 Restlessness and agitation: Secondary | ICD-10-CM

## 2021-06-30 MED ORDER — METHYLPHENIDATE HCL ER (OSM) 54 MG PO TBCR: 54.0000 mg | EXTENDED_RELEASE_TABLET | ORAL | 0 refills | Status: DC

## 2021-06-30 NOTE — Telephone Encounter (Signed)
Rx  sent  to  [facility]

## 2021-07-06 ENCOUNTER — Encounter: Payer: Self-pay | Admitting: Physical Medicine & Rehabilitation

## 2021-07-07 MED ORDER — METHYLPHENIDATE HCL ER (LA) 40 MG PO CP24: 40.0000 mg | ORAL_CAPSULE | Freq: Every day | ORAL | 0 refills | Status: DC

## 2021-07-07 NOTE — Telephone Encounter (Signed)
Will try ritalin '40mg'$  LA for now.  ?

## 2021-07-09 ENCOUNTER — Telehealth: Payer: Self-pay

## 2021-07-09 NOTE — Telephone Encounter (Signed)
PA for Concerta sent to insurance through CoverMyMeds ?

## 2021-07-15 NOTE — Telephone Encounter (Signed)
Patient was started on Ritalin LA 40 mg due to shortage in pharmacies for the generic Concerta  ?

## 2021-07-24 NOTE — Telephone Encounter (Signed)
Concerta was denied by insurance. ?

## 2021-08-03 ENCOUNTER — Encounter: Payer: Self-pay | Admitting: Physical Medicine & Rehabilitation

## 2021-08-03 MED ORDER — METHYLPHENIDATE HCL ER (LA) 40 MG PO CP24: 40.0000 mg | ORAL_CAPSULE | Freq: Every day | ORAL | 0 refills | Status: DC

## 2021-08-03 NOTE — Telephone Encounter (Signed)
Ritalin refilled

## 2021-09-07 ENCOUNTER — Other Ambulatory Visit: Payer: Self-pay | Admitting: Physical Medicine & Rehabilitation

## 2021-09-08 ENCOUNTER — Encounter: Payer: Self-pay | Admitting: Physical Medicine & Rehabilitation

## 2021-09-08 MED ORDER — METHYLPHENIDATE HCL ER (LA) 40 MG PO CP24: 40.0000 mg | ORAL_CAPSULE | Freq: Every day | ORAL | 0 refills | Status: DC

## 2021-09-23 ENCOUNTER — Encounter
Payer: Commercial Managed Care - HMO | Attending: Physical Medicine & Rehabilitation | Admitting: Physical Medicine & Rehabilitation

## 2021-09-23 ENCOUNTER — Encounter: Payer: Self-pay | Admitting: Physical Medicine & Rehabilitation

## 2021-09-23 VITALS — BP 115/81 | HR 87 | Ht 69.0 in | Wt 231.0 lb

## 2021-09-23 DIAGNOSIS — S82401S Unspecified fracture of shaft of right fibula, sequela: Secondary | ICD-10-CM | POA: Insufficient documentation

## 2021-09-23 DIAGNOSIS — S062X4S Diffuse traumatic brain injury with loss of consciousness of 6 hours to 24 hours, sequela: Secondary | ICD-10-CM | POA: Diagnosis present

## 2021-09-23 DIAGNOSIS — S82201S Unspecified fracture of shaft of right tibia, sequela: Secondary | ICD-10-CM | POA: Insufficient documentation

## 2021-09-23 MED ORDER — METHYLPHENIDATE HCL ER (LA) 40 MG PO CP24: 40.0000 mg | ORAL_CAPSULE | Freq: Every day | ORAL | 0 refills | Status: DC

## 2021-09-23 NOTE — Patient Instructions (Signed)
PLEASE FEEL FREE TO CALL OUR OFFICE WITH ANY PROBLEMS OR QUESTIONS ([phone])      
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
## 2021-10-07 ENCOUNTER — Encounter: Payer: Self-pay | Admitting: Physical Medicine & Rehabilitation

## 2021-10-07 MED ORDER — METHYLPHENIDATE HCL ER (CD) 50 MG PO CPCR: 50.0000 mg | ORAL_CAPSULE | ORAL | 0 refills | Status: DC

## 2021-11-04 ENCOUNTER — Other Ambulatory Visit: Payer: Self-pay | Admitting: Physical Medicine & Rehabilitation

## 2021-11-06 ENCOUNTER — Encounter: Payer: Self-pay | Admitting: Physical Medicine & Rehabilitation

## 2021-11-06 DIAGNOSIS — S062X4S Diffuse traumatic brain injury with loss of consciousness of 6 hours to 24 hours, sequela: Secondary | ICD-10-CM

## 2021-11-06 MED ORDER — METHYLPHENIDATE HCL ER (CD) 50 MG PO CPCR
50.0000 mg | ORAL_CAPSULE | ORAL | 0 refills | Status: DC
Start: 2021-11-06 — End: 2021-12-03

## 2021-11-06 NOTE — Telephone Encounter (Signed)
Rx sent 

## 2021-11-13 ENCOUNTER — Other Ambulatory Visit: Payer: Self-pay | Admitting: Physical Medicine & Rehabilitation

## 2021-12-03 ENCOUNTER — Encounter: Payer: Self-pay | Admitting: Physical Medicine & Rehabilitation

## 2021-12-03 ENCOUNTER — Other Ambulatory Visit: Payer: Self-pay | Admitting: Physical Medicine & Rehabilitation

## 2021-12-03 DIAGNOSIS — F329 Major depressive disorder, single episode, unspecified: Secondary | ICD-10-CM

## 2021-12-03 MED ORDER — METHYLPHENIDATE HCL ER (CD) 50 MG PO CPCR: 50.0000 mg | ORAL_CAPSULE | ORAL | 0 refills | Status: DC

## 2021-12-03 NOTE — Telephone Encounter (Signed)
Rx written and sent to the pharmacy. [facility]

## 2021-12-30 ENCOUNTER — Encounter
Payer: Commercial Managed Care - HMO | Attending: Physical Medicine & Rehabilitation | Admitting: Physical Medicine & Rehabilitation

## 2021-12-30 ENCOUNTER — Encounter: Payer: Self-pay | Admitting: Physical Medicine & Rehabilitation

## 2021-12-30 VITALS — BP 115/77 | HR 78 | Ht 69.0 in | Wt 227.2 lb

## 2021-12-30 DIAGNOSIS — S062X4S Diffuse traumatic brain injury with loss of consciousness of 6 hours to 24 hours, sequela: Secondary | ICD-10-CM | POA: Insufficient documentation

## 2021-12-30 DIAGNOSIS — F329 Major depressive disorder, single episode, unspecified: Secondary | ICD-10-CM | POA: Diagnosis present

## 2021-12-30 MED ORDER — METHYLPHENIDATE HCL ER (CD) 50 MG PO CPCR: 50.0000 mg | ORAL_CAPSULE | ORAL | 0 refills | Status: DC
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
## 2021-12-30 NOTE — Patient Instructions (Signed)
ZYPREXA:  DECREASE TO 7.'5MG'$  AT BEDTIME FOR 2 WEEKS AND IF ALL IS OK, DECREASE TO '5MG'$     WORK ON A MONDAY - FRIDAY DAILY SCHEDULE TO HELP WITH MEMORY

## 2022-02-11 ENCOUNTER — Encounter: Payer: Self-pay | Admitting: Physical Medicine & Rehabilitation

## 2022-03-08 ENCOUNTER — Encounter: Payer: Self-pay | Admitting: Physical Medicine & Rehabilitation

## 2022-03-08 DIAGNOSIS — S062X4S Diffuse traumatic brain injury with loss of consciousness of 6 hours to 24 hours, sequela: Secondary | ICD-10-CM

## 2022-03-08 DIAGNOSIS — F329 Major depressive disorder, single episode, unspecified: Secondary | ICD-10-CM

## 2022-03-09 MED ORDER — METHYLPHENIDATE HCL ER (CD) 50 MG PO CPCR: 50.0000 mg | ORAL_CAPSULE | ORAL | 0 refills | Status: DC

## 2022-03-09 NOTE — Telephone Encounter (Signed)
Metadate rx's sent

## 2022-03-31 ENCOUNTER — Encounter: Payer: Self-pay | Admitting: Psychology

## 2022-03-31 ENCOUNTER — Encounter: Payer: Commercial Managed Care - HMO | Attending: Psychology | Admitting: Psychology

## 2022-03-31 ENCOUNTER — Encounter: Payer: Self-pay | Admitting: Physical Medicine & Rehabilitation

## 2022-03-31 DIAGNOSIS — F329 Major depressive disorder, single episode, unspecified: Secondary | ICD-10-CM | POA: Diagnosis not present

## 2022-03-31 DIAGNOSIS — R5383 Other fatigue: Secondary | ICD-10-CM

## 2022-03-31 DIAGNOSIS — Z5181 Encounter for therapeutic drug level monitoring: Secondary | ICD-10-CM

## 2022-03-31 DIAGNOSIS — R451 Restlessness and agitation: Secondary | ICD-10-CM

## 2022-03-31 DIAGNOSIS — S069X0D Unspecified intracranial injury without loss of consciousness, subsequent encounter: Secondary | ICD-10-CM

## 2022-03-31 DIAGNOSIS — S062X4S Diffuse traumatic brain injury with loss of consciousness of 6 hours to 24 hours, sequela: Secondary | ICD-10-CM | POA: Insufficient documentation

## 2022-03-31 DIAGNOSIS — G479 Sleep disorder, unspecified: Secondary | ICD-10-CM
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
## 2022-04-01 NOTE — Telephone Encounter (Signed)
I've ordered a complete blood count and metabolic panel as well as valproic acid level. These need to be done in morning before meds are taken. The labs can be collected at any [facility] location including the one next to our office.

## 2022-04-08 LAB — COMPREHENSIVE METABOLIC PANEL
ALT: 89 IU/L — ABNORMAL HIGH (ref 0–44)
AST: 49 IU/L — ABNORMAL HIGH (ref 0–40)
Albumin/Globulin Ratio: 1.9 (ref 1.2–2.2)
Albumin: 4.6 g/dL (ref 3.8–4.9)
Alkaline Phosphatase: 70 IU/L (ref 44–121)
BUN/Creatinine Ratio: 13 (ref 9–20)
BUN: 13 mg/dL (ref 6–24)
Bilirubin Total: 0.4 mg/dL (ref 0.0–1.2)
CO2: 23 mmol/L (ref 20–29)
Calcium: 9.2 mg/dL (ref 8.7–10.2)
Chloride: 103 mmol/L (ref 96–106)
Creatinine, Ser: 1.01 mg/dL (ref 0.76–1.27)
Globulin, Total: 2.4 g/dL (ref 1.5–4.5)
Glucose: 90 mg/dL (ref 70–99)
Potassium: 4.7 mmol/L (ref 3.5–5.2)
Sodium: 142 mmol/L (ref 134–144)
Total Protein: 7 g/dL (ref 6.0–8.5)
eGFR: 90 mL/min/{1.73_m2} (ref >59)

## 2022-04-08 LAB — CBC
Hematocrit: 49 % (ref 37.5–51.0)
Hemoglobin: 15.8 g/dL (ref 13.0–17.7)
MCH: 29.6 pg (ref 26.6–33.0)
MCHC: 32.2 g/dL (ref 31.5–35.7)
MCV: 92 fL (ref 79–97)
Platelets: 235 10*3/uL (ref 150–450)
RBC: 5.34 x10E6/uL (ref 4.14–5.80)
RDW: 13.1 % (ref 11.6–15.4)
WBC: 5.2 10*3/uL (ref 3.4–10.8)

## 2022-04-08 LAB — VALPROIC ACID LEVEL: Valproic Acid Lvl: 55 ug/mL (ref 50–100)

## 2022-04-09 ENCOUNTER — Encounter: Payer: Self-pay | Admitting: Physical Medicine & Rehabilitation

## 2022-04-18 ENCOUNTER — Other Ambulatory Visit: Payer: Self-pay | Admitting: Physical Medicine & Rehabilitation

## 2022-05-05 ENCOUNTER — Encounter: Payer: Self-pay | Admitting: Physical Medicine & Rehabilitation

## 2022-05-05 ENCOUNTER — Encounter: Payer: Medicaid Other | Attending: Psychology | Admitting: Physical Medicine & Rehabilitation

## 2022-05-05 VITALS — BP 116/79 | HR 81 | Ht 69.0 in | Wt 232.0 lb

## 2022-05-05 DIAGNOSIS — S82401S Unspecified fracture of shaft of right fibula, sequela: Secondary | ICD-10-CM

## 2022-05-05 DIAGNOSIS — S82201S Unspecified fracture of shaft of right tibia, sequela: Secondary | ICD-10-CM | POA: Diagnosis present

## 2022-05-05 DIAGNOSIS — S062X4S Diffuse traumatic brain injury with loss of consciousness of 6 hours to 24 hours, sequela: Secondary | ICD-10-CM | POA: Diagnosis present

## 2022-05-05 DIAGNOSIS — K219 Gastro-esophageal reflux disease without esophagitis: Secondary | ICD-10-CM | POA: Insufficient documentation

## 2022-05-05 DIAGNOSIS — F329 Major depressive disorder, single episode, unspecified: Secondary | ICD-10-CM | POA: Diagnosis present

## 2022-05-05 DIAGNOSIS — Z5181 Encounter for therapeutic drug level monitoring: Secondary | ICD-10-CM | POA: Diagnosis present

## 2022-05-05 MED ORDER — METHYLPHENIDATE HCL ER (OSM) 36 MG PO TBCR: 36.0000 mg | EXTENDED_RELEASE_TABLET | Freq: Every day | ORAL | 0 refills | Status: DC

## 2022-05-05 MED ORDER — PANTOPRAZOLE SODIUM 40 MG PO TBEC: 40.0000 mg | DELAYED_RELEASE_TABLET | Freq: Every day | ORAL | 5 refills | Status: DC

## 2022-05-05 NOTE — Patient Instructions (Addendum)
ALWAYS FEEL FREE TO CALL OUR OFFICE WITH ANY PROBLEMS OR QUESTIONS ([phone])  **PLEASE NOTE** ALL MEDICATION REFILL REQUESTS (INCLUDING CONTROLLED SUBSTANCES) NEED TO BE MADE AT LEAST 7 DAYS PRIOR TO REFILL BEING DUE. ANY REFILL REQUESTS INSIDE THAT TIME FRAME MAY RESULT IN DELAYS IN RECEIVING YOUR PRESCRIPTION.   Probiotics in your diet. Greek yogurt!
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
## 2022-05-12 ENCOUNTER — Other Ambulatory Visit: Payer: Self-pay | Admitting: Physical Medicine & Rehabilitation

## 2022-05-15 LAB — HEPATIC FUNCTION PANEL
ALT: 73 IU/L — ABNORMAL HIGH (ref 0–44)
AST: 47 IU/L — ABNORMAL HIGH (ref 0–40)
Albumin: 4.2 g/dL (ref 3.8–4.9)
Alkaline Phosphatase: 67 IU/L (ref 44–121)
Bilirubin Total: 0.4 mg/dL (ref 0.0–1.2)
Bilirubin, Direct: 0.13 mg/dL (ref 0.00–0.40)
Total Protein: 6.6 g/dL (ref 6.0–8.5)

## 2022-05-15 LAB — VALPROIC ACID LEVEL: Valproic Acid Lvl: 43 ug/mL — ABNORMAL LOW (ref 50–100)

## 2022-05-21 ENCOUNTER — Encounter: Payer: Self-pay | Admitting: Physical Medicine & Rehabilitation

## 2022-05-28 ENCOUNTER — Other Ambulatory Visit: Payer: Self-pay | Admitting: Physical Medicine & Rehabilitation

## 2022-06-04 MED ORDER — FLUTICASONE PROPIONATE 50 MCG/ACT NA SUSP: 1.0000 | Freq: Every day | NASAL | 3 refills | Status: AC

## 2022-06-04 NOTE — Telephone Encounter (Signed)
I didn't deny the flonase. Not sure what happened. New RF sent in.

## 2022-06-06 ENCOUNTER — Encounter: Payer: Self-pay | Admitting: Physical Medicine & Rehabilitation

## 2022-06-06 DIAGNOSIS — S062X4S Diffuse traumatic brain injury with loss of consciousness of 6 hours to 24 hours, sequela: Secondary | ICD-10-CM

## 2022-06-08 ENCOUNTER — Encounter: Payer: Self-pay | Admitting: Physical Medicine & Rehabilitation

## 2022-06-08 MED ORDER — METHYLPHENIDATE HCL ER (OSM) 36 MG PO TBCR: 36.0000 mg | EXTENDED_RELEASE_TABLET | Freq: Every day | ORAL | 0 refills | Status: DC

## 2022-06-08 NOTE — Telephone Encounter (Signed)
There is already an rx for 06/05/22.  I filled another for 07/02/22.

## 2022-06-09 ENCOUNTER — Other Ambulatory Visit: Payer: Self-pay | Admitting: Physical Medicine & Rehabilitation

## 2022-06-09 DIAGNOSIS — F329 Major depressive disorder, single episode, unspecified: Secondary | ICD-10-CM

## 2022-07-06 ENCOUNTER — Encounter: Payer: Self-pay | Admitting: Physical Medicine & Rehabilitation

## 2022-07-06 DIAGNOSIS — S062X4S Diffuse traumatic brain injury with loss of consciousness of 6 hours to 24 hours, sequela: Secondary | ICD-10-CM

## 2022-07-07 MED ORDER — METHYLPHENIDATE HCL ER (OSM) 36 MG PO TBCR: 36.0000 mg | EXTENDED_RELEASE_TABLET | Freq: Every day | ORAL | 0 refills | Status: DC

## 2022-07-07 NOTE — Telephone Encounter (Signed)
TWO rx'es were written, one for April. There was one already in system for 3/1. Pharmacy either missed it or discarded it

## 2022-08-23 ENCOUNTER — Encounter: Payer: Self-pay | Admitting: Physical Medicine & Rehabilitation

## 2022-08-23 DIAGNOSIS — R7401 Elevation of levels of liver transaminase levels: Secondary | ICD-10-CM

## 2022-08-24 NOTE — Telephone Encounter (Signed)
If you would like to go ahead and stop the zyprexa, go ahead and see how he does.    As far as the lab tests are concerned, I ordered a liver function panel. He can go to any [facility] location and get that done before his visit with me.

## 2022-08-27 ENCOUNTER — Encounter: Payer: Self-pay | Admitting: Physical Medicine & Rehabilitation

## 2022-08-27 LAB — HEPATIC FUNCTION PANEL
ALT: 33 IU/L (ref 0–44)
AST: 20 IU/L (ref 0–40)
Albumin: 4.2 g/dL (ref 3.8–4.9)
Alkaline Phosphatase: 67 IU/L (ref 44–121)
Bilirubin Total: 0.4 mg/dL (ref 0.0–1.2)
Bilirubin, Direct: <0.1 mg/dL (ref 0.00–0.40)
Total Protein: 6.8 g/dL (ref 6.0–8.5)

## 2022-09-01 ENCOUNTER — Encounter: Payer: Medicaid Other | Attending: Physical Medicine & Rehabilitation | Admitting: Physical Medicine & Rehabilitation

## 2022-09-01 ENCOUNTER — Encounter: Payer: Self-pay | Admitting: Physical Medicine & Rehabilitation

## 2022-09-01 VITALS — BP 114/76 | HR 87 | Ht 69.0 in | Wt 226.0 lb

## 2022-09-01 DIAGNOSIS — G479 Sleep disorder, unspecified: Secondary | ICD-10-CM | POA: Diagnosis present

## 2022-09-01 DIAGNOSIS — S062X4S Diffuse traumatic brain injury with loss of consciousness of 6 hours to 24 hours, sequela: Secondary | ICD-10-CM | POA: Diagnosis present

## 2022-09-01 DIAGNOSIS — R5383 Other fatigue: Secondary | ICD-10-CM | POA: Diagnosis present

## 2022-09-01 MED ORDER — METHYLPHENIDATE HCL ER (OSM) 54 MG PO TBCR
54.0000 mg | EXTENDED_RELEASE_TABLET | ORAL | 0 refills | Status: DC
Start: 2022-09-01 — End: 2022-11-02
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
## 2022-09-01 NOTE — Patient Instructions (Signed)
ALWAYS FEEL FREE TO CALL OUR OFFICE WITH ANY PROBLEMS OR QUESTIONS ([phone])  **PLEASE NOTE** ALL MEDICATION REFILL REQUESTS (INCLUDING CONTROLLED SUBSTANCES) NEED TO BE MADE AT LEAST 7 DAYS PRIOR TO REFILL BEING DUE. ANY REFILL REQUESTS INSIDE THAT TIME FRAME MAY RESULT IN DELAYS IN RECEIVING YOUR PRESCRIPTION.                    

## 2022-09-12 LAB — INSULIN-LIKE GROWTH FACTOR: Insulin-Like GF-1: 177 ng/mL (ref 74–255)

## 2022-09-15 LAB — TESTOSTERONE, FREE: Testosterone, Free: 9.9 pg/mL (ref 7.2–24.0)

## 2022-09-15 LAB — T4, FREE: Free T4: 1.22 ng/dL (ref 0.82–1.77)

## 2022-09-15 LAB — TSH: TSH: 2.21 u[IU]/mL (ref 0.450–4.500)

## 2022-09-15 LAB — TESTOSTERONE: Testosterone: 275 ng/dL (ref 264–916)

## 2022-09-15 LAB — CORTISOL-AM, BLOOD: Cortisol - AM: 18.6 ug/dL (ref 6.2–19.4)

## 2022-10-07 ENCOUNTER — Encounter: Payer: Medicare Other | Attending: Psychology | Admitting: Psychology

## 2022-10-07 ENCOUNTER — Encounter: Payer: Self-pay | Admitting: Physical Medicine & Rehabilitation

## 2022-10-07 DIAGNOSIS — S062X4S Diffuse traumatic brain injury with loss of consciousness of 6 hours to 24 hours, sequela: Secondary | ICD-10-CM | POA: Insufficient documentation

## 2022-10-07 DIAGNOSIS — R5383 Other fatigue: Secondary | ICD-10-CM | POA: Diagnosis present

## 2022-10-07 DIAGNOSIS — F329 Major depressive disorder, single episode, unspecified: Secondary | ICD-10-CM | POA: Insufficient documentation

## 2022-10-07 DIAGNOSIS — G479 Sleep disorder, unspecified: Secondary | ICD-10-CM | POA: Diagnosis present

## 2022-10-21 ENCOUNTER — Encounter: Payer: Medicare Other | Admitting: Psychology

## 2022-10-21 ENCOUNTER — Encounter: Payer: Self-pay | Admitting: Psychology

## 2022-10-21 DIAGNOSIS — F329 Major depressive disorder, single episode, unspecified: Secondary | ICD-10-CM | POA: Diagnosis not present

## 2022-10-21 DIAGNOSIS — R5383 Other fatigue: Secondary | ICD-10-CM | POA: Diagnosis not present

## 2022-10-21 DIAGNOSIS — S062X4S Diffuse traumatic brain injury with loss of consciousness of 6 hours to 24 hours, sequela: Secondary | ICD-10-CM | POA: Diagnosis not present

## 2022-10-21 DIAGNOSIS — G479 Sleep disorder, unspecified: Secondary | ICD-10-CM | POA: Diagnosis not present
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
## 2022-10-30 ENCOUNTER — Encounter: Payer: Self-pay | Admitting: Physical Medicine & Rehabilitation

## 2022-10-30 ENCOUNTER — Other Ambulatory Visit: Payer: Self-pay | Admitting: Physical Medicine & Rehabilitation

## 2022-10-30 DIAGNOSIS — F329 Major depressive disorder, single episode, unspecified: Secondary | ICD-10-CM

## 2022-10-30 DIAGNOSIS — S062X4S Diffuse traumatic brain injury with loss of consciousness of 6 hours to 24 hours, sequela: Secondary | ICD-10-CM

## 2022-10-30 DIAGNOSIS — K219 Gastro-esophageal reflux disease without esophagitis: Secondary | ICD-10-CM

## 2022-10-30 DIAGNOSIS — R5383 Other fatigue: Secondary | ICD-10-CM

## 2022-10-30 DIAGNOSIS — G479 Sleep disorder, unspecified: Secondary | ICD-10-CM

## 2022-11-02 MED ORDER — OLANZAPINE 5 MG PO TABS: ORAL_TABLET | ORAL | 5 refills | Status: DC

## 2022-11-02 MED ORDER — CITALOPRAM HYDROBROMIDE 40 MG PO TABS
ORAL_TABLET | ORAL | 5 refills | Status: DC
Start: 2022-11-02 — End: 2023-05-02

## 2022-11-02 MED ORDER — METHYLPHENIDATE HCL ER (OSM) 54 MG PO TBCR: 54.0000 mg | EXTENDED_RELEASE_TABLET | ORAL | 0 refills | Status: DC

## 2022-11-02 MED ORDER — METHYLPHENIDATE HCL ER (OSM) 54 MG PO TBCR
54.0000 mg | EXTENDED_RELEASE_TABLET | ORAL | 0 refills | Status: DC
Start: 2022-11-02 — End: 2023-01-04

## 2022-11-02 NOTE — Telephone Encounter (Signed)
Refilled meds for [place] including 2 mos of concerta

## 2022-11-08 ENCOUNTER — Ambulatory Visit: Payer: Commercial Managed Care - HMO | Admitting: Psychology

## 2022-11-22 ENCOUNTER — Encounter: Payer: Medicare Other | Attending: Psychology | Admitting: Psychology

## 2022-11-22 DIAGNOSIS — F329 Major depressive disorder, single episode, unspecified: Secondary | ICD-10-CM | POA: Insufficient documentation

## 2022-11-22 DIAGNOSIS — R5383 Other fatigue: Secondary | ICD-10-CM | POA: Diagnosis not present

## 2022-11-22 DIAGNOSIS — G479 Sleep disorder, unspecified: Secondary | ICD-10-CM | POA: Insufficient documentation

## 2022-11-22 DIAGNOSIS — S062X4S Diffuse traumatic brain injury with loss of consciousness of 6 hours to 24 hours, sequela: Secondary | ICD-10-CM | POA: Diagnosis not present

## 2022-11-23 ENCOUNTER — Encounter: Payer: Self-pay | Admitting: Psychology
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
## 2022-11-29 ENCOUNTER — Encounter (HOSPITAL_BASED_OUTPATIENT_CLINIC_OR_DEPARTMENT_OTHER): Payer: Medicare Other | Admitting: Psychology

## 2022-11-29 DIAGNOSIS — G479 Sleep disorder, unspecified: Secondary | ICD-10-CM

## 2022-11-29 DIAGNOSIS — F329 Major depressive disorder, single episode, unspecified: Secondary | ICD-10-CM | POA: Diagnosis not present

## 2022-11-29 DIAGNOSIS — S062X4S Diffuse traumatic brain injury with loss of consciousness of 6 hours to 24 hours, sequela: Secondary | ICD-10-CM

## 2023-01-04 ENCOUNTER — Encounter: Payer: Self-pay | Admitting: Physical Medicine & Rehabilitation

## 2023-01-04 DIAGNOSIS — R5383 Other fatigue: Secondary | ICD-10-CM

## 2023-01-04 DIAGNOSIS — S062X4S Diffuse traumatic brain injury with loss of consciousness of 6 hours to 24 hours, sequela: Secondary | ICD-10-CM

## 2023-01-04 DIAGNOSIS — G479 Sleep disorder, unspecified: Secondary | ICD-10-CM

## 2023-01-04 MED ORDER — METHYLPHENIDATE HCL ER (OSM) 54 MG PO TBCR
54.0000 mg | EXTENDED_RELEASE_TABLET | ORAL | 0 refills | Status: DC
Start: 2023-01-04 — End: 2023-03-04

## 2023-01-04 MED ORDER — METHYLPHENIDATE HCL ER (OSM) 54 MG PO TBCR: 54.0000 mg | EXTENDED_RELEASE_TABLET | ORAL | 0 refills | Status: DC

## 2023-01-04 NOTE — Telephone Encounter (Signed)
Rx'es sent to pharmacy! See you tomorrow!

## 2023-01-05 ENCOUNTER — Encounter: Payer: Medicare Other | Attending: Psychology | Admitting: Physical Medicine & Rehabilitation

## 2023-01-05 ENCOUNTER — Encounter: Payer: Self-pay | Admitting: Physical Medicine & Rehabilitation

## 2023-01-05 VITALS — BP 113/77 | HR 90 | Ht 69.0 in | Wt 226.0 lb

## 2023-01-05 DIAGNOSIS — F329 Major depressive disorder, single episode, unspecified: Secondary | ICD-10-CM | POA: Diagnosis present

## 2023-01-05 DIAGNOSIS — S062X4S Diffuse traumatic brain injury with loss of consciousness of 6 hours to 24 hours, sequela: Secondary | ICD-10-CM | POA: Diagnosis not present

## 2023-01-05 DIAGNOSIS — S82201S Unspecified fracture of shaft of right tibia, sequela: Secondary | ICD-10-CM | POA: Insufficient documentation

## 2023-01-05 DIAGNOSIS — S82401S Unspecified fracture of shaft of right fibula, sequela: Secondary | ICD-10-CM | POA: Diagnosis present

## 2023-01-05 NOTE — Patient Instructions (Signed)
ALWAYS FEEL FREE TO CALL OUR OFFICE WITH ANY PROBLEMS OR QUESTIONS ([phone])  **PLEASE NOTE** ALL MEDICATION REFILL REQUESTS (INCLUDING CONTROLLED SUBSTANCES) NEED TO BE MADE AT LEAST 7 DAYS PRIOR TO REFILL BEING DUE. ANY REFILL REQUESTS INSIDE THAT TIME FRAME MAY RESULT IN DELAYS IN RECEIVING YOUR PRESCRIPTION.                    
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
## 2023-01-15 IMAGING — CR DG TIBIA/FIBULA 2V*R*
3 series · 3 of 3 positions shown · non-contrast
Comparison: 06/15/2020

CLINICAL DATA: Tib-fib fractures.

EXAM:
RIGHT TIBIA AND FIBULA - 2 VIEW

[tibia ap]
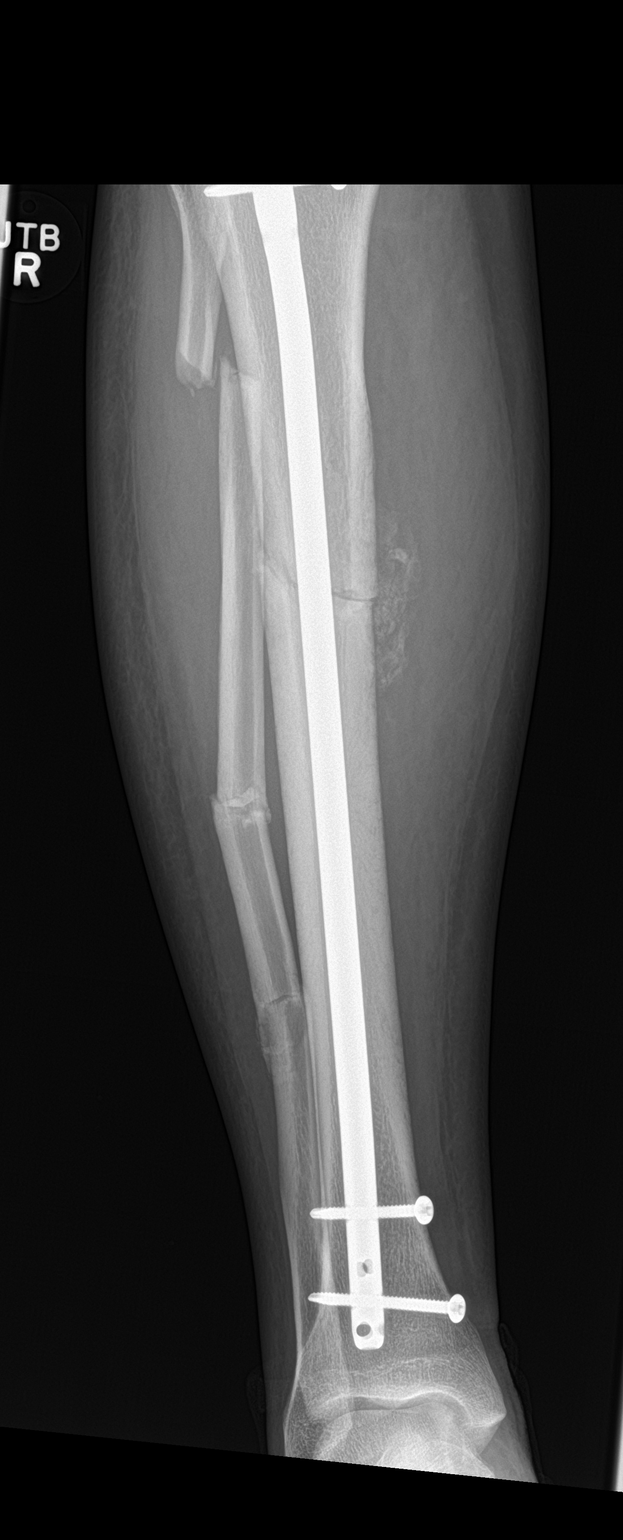

[tibia lat (1 of 2)]
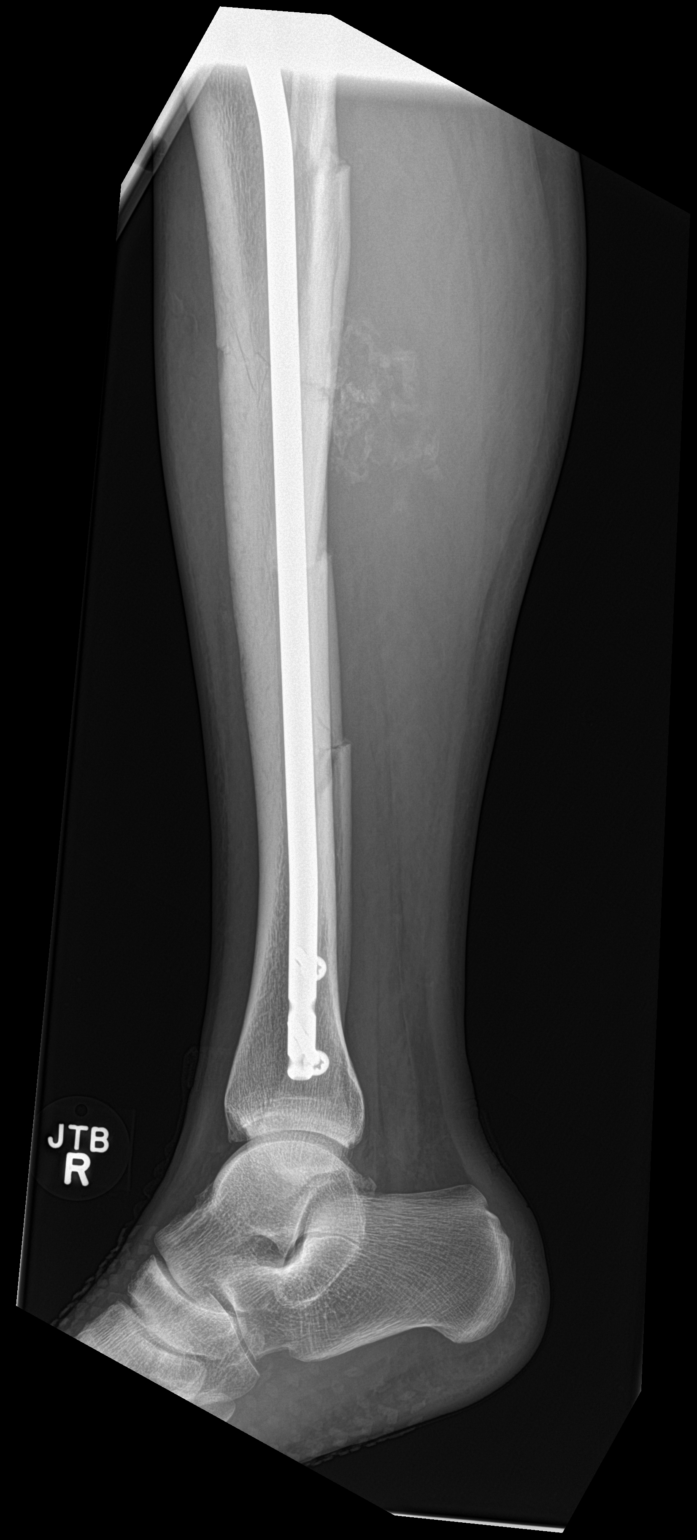

[tibia lat (2 of 2)]
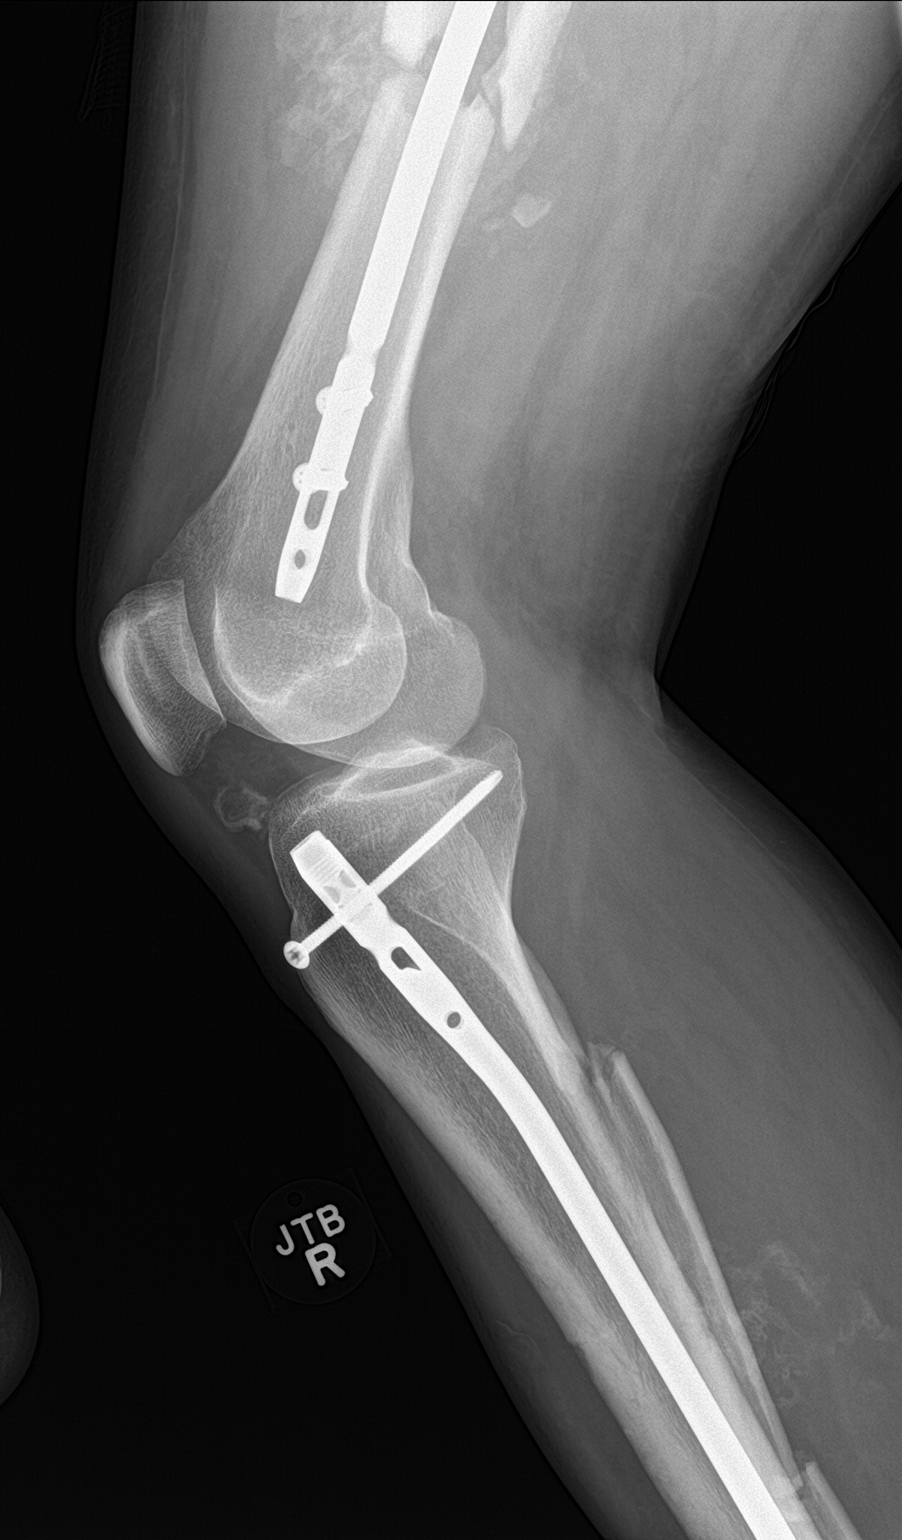

[3 of 3 positions shown; findings below may reference images not displayed]

FINDINGS: Stable intramedullary rod in the tibia with 2 proximal and 2 distal
interlocking screws.

Stable appearance of the mid tibia fracture with slight progressive
heterotopic ossification along the medial posterior aspect of the
fracture.

Stable appearing segmental fractures of the fibula. No significant
healing changes.
IMPRESSION: 1. Stable intramedullary rod in the tibia with unchanged appearance
of the fracture.
2. Stable segmental fractures of the fibula.
3. Progressive heterotopic ossification.

## 2023-01-17 IMAGING — DX DG FEMUR 2+V PORT*R*
4 series · 4 of 4 positions shown · non-contrast
Comparison: None.

CLINICAL DATA: Status post fall.

EXAM:
RIGHT FEMUR PORTABLE 2 VIEW

[femur ap]
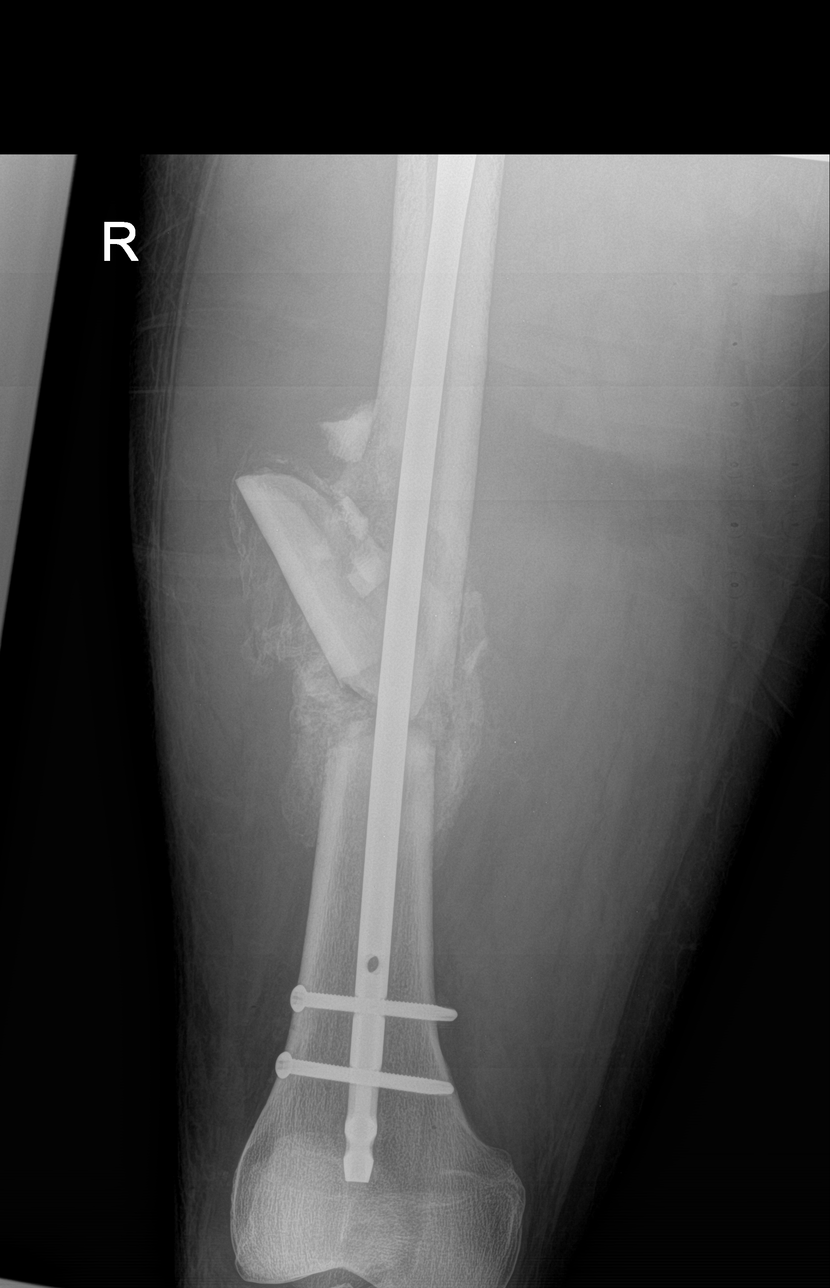

[femur lat (1 of 3)]
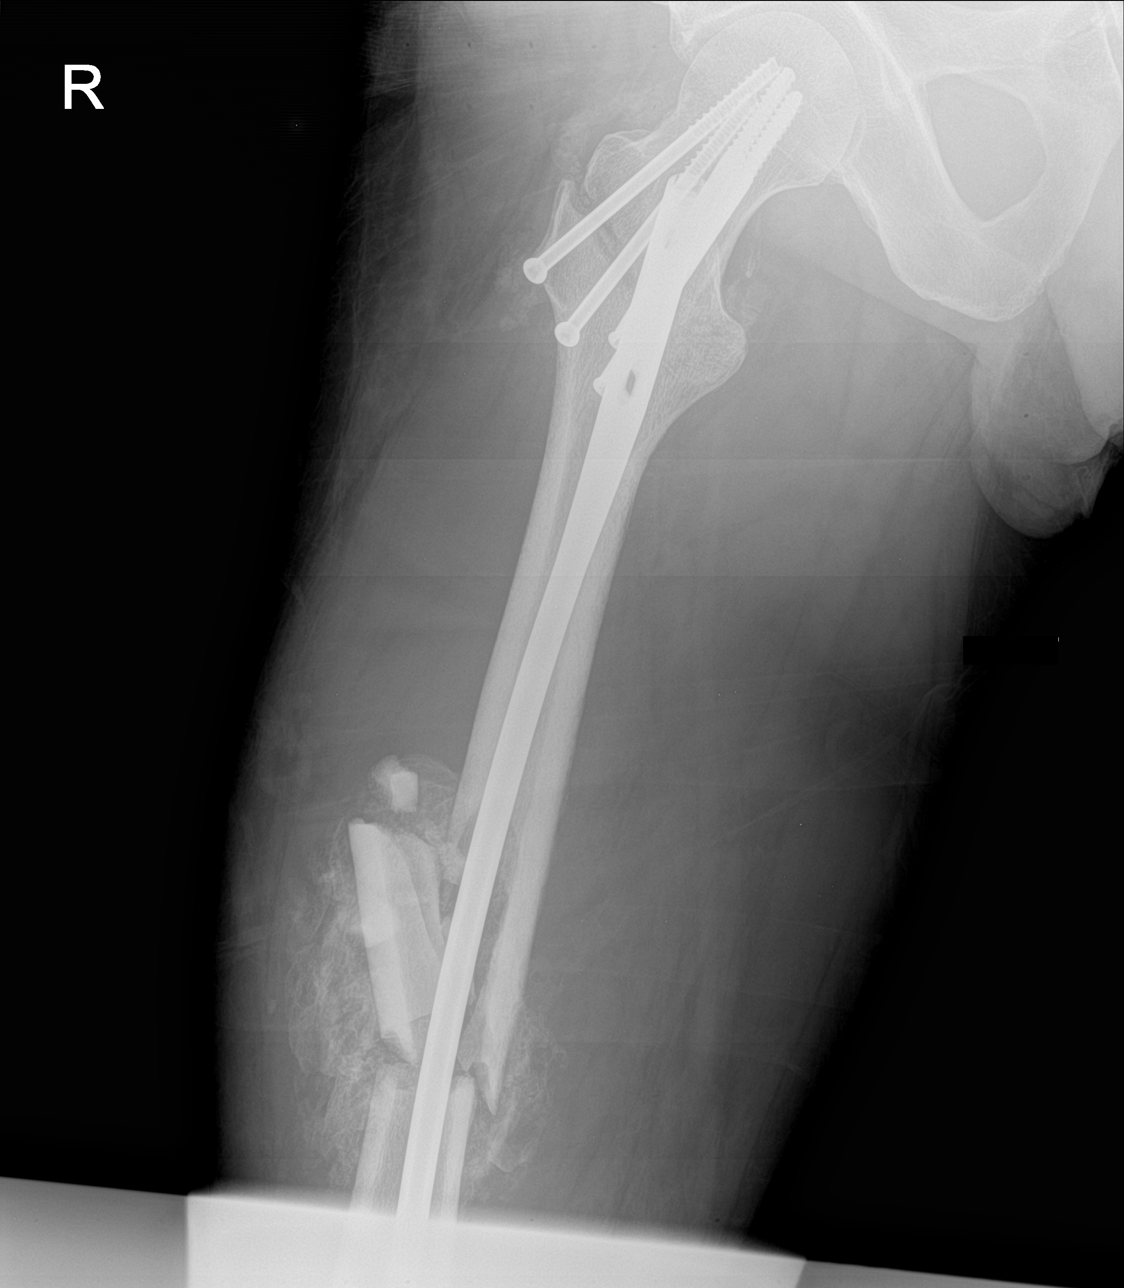

[femur lat (2 of 3)]
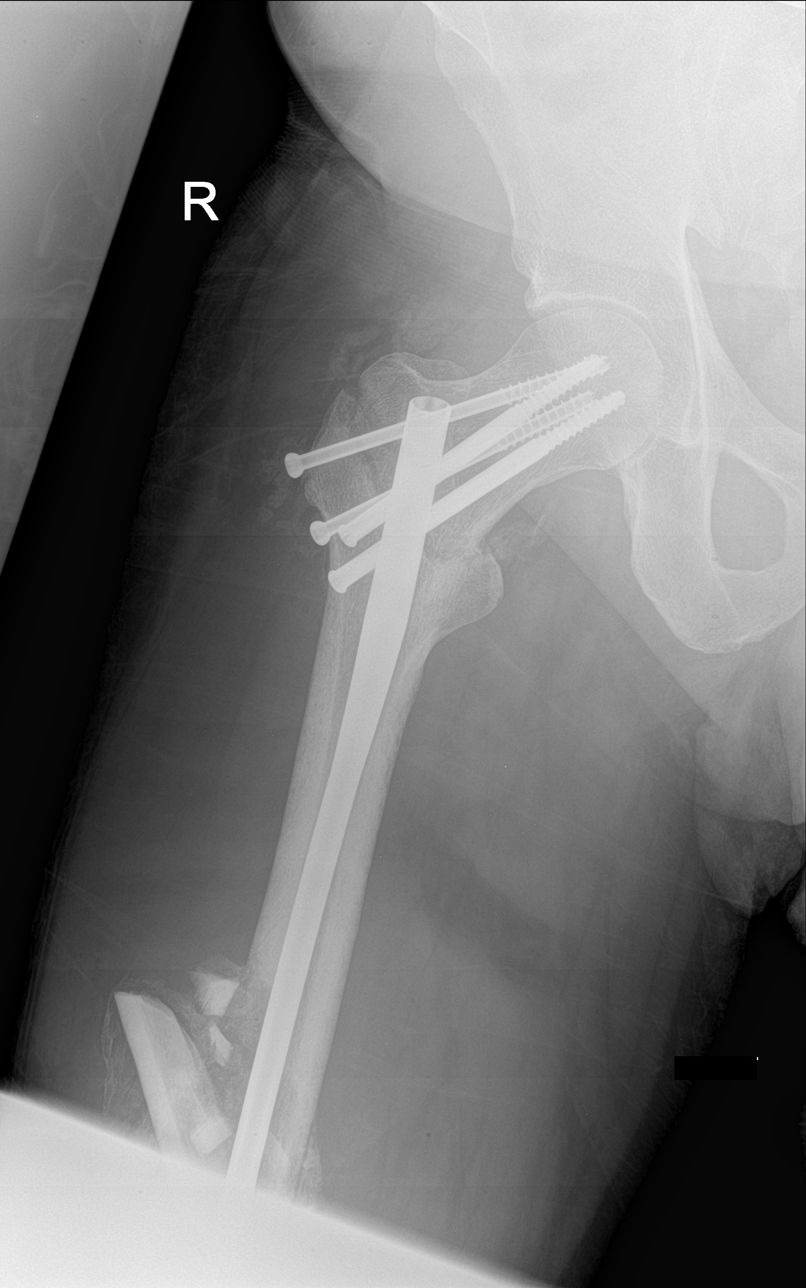

[femur lat (3 of 3)]
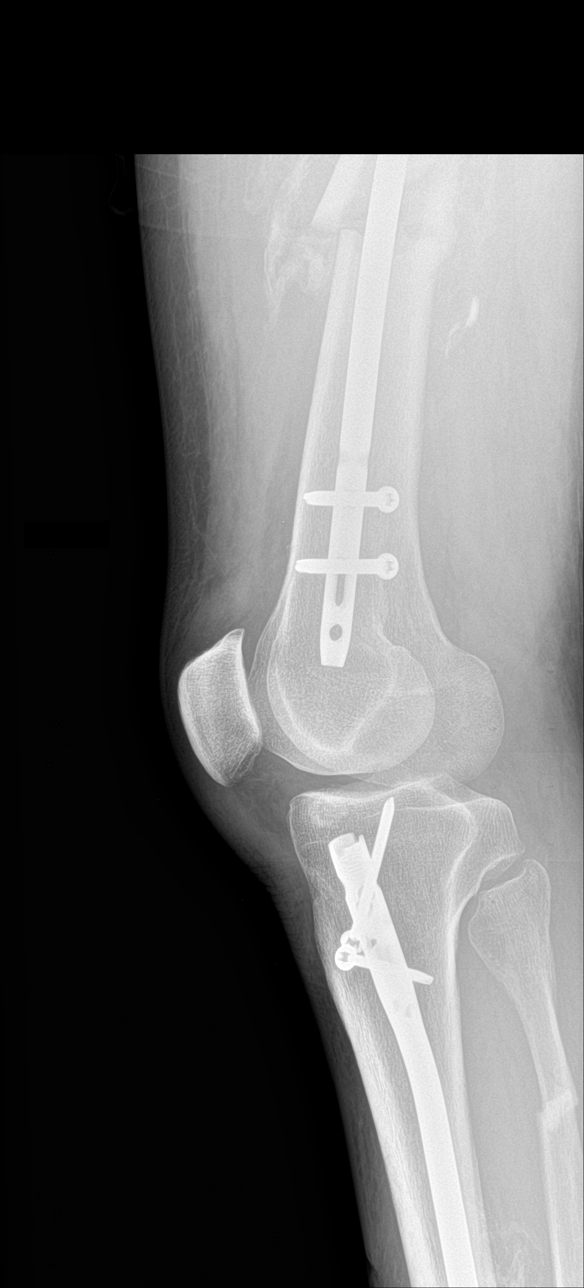

[4 of 4 positions shown; findings below may reference images not displayed]

FINDINGS: A radiopaque intramedullary rod is seen along the length of the
right femur with multiple radiopaque surgical screws seen within the
right femoral head and neck. Distal femoral fixation screws are also
present. A chronic, comminuted fracture deformity is seen involving
the mid right femoral shaft, with an additional chronic right inter
trochanteric fracture noted. A radiopaque intramedullary rod is seen
within the proximal right tibia. There is a displaced fracture of
the proximal right fibula. Soft tissue swelling is seen in the
region of the mid right thigh.
IMPRESSION: 1. Displaced fracture of the proximal right fibula.
2. Prior open reduction and internal fixation of the right femur and
right tibia.

## 2023-01-25 ENCOUNTER — Encounter: Payer: Self-pay | Admitting: Psychology
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
## 2023-02-28 ENCOUNTER — Encounter: Payer: Self-pay | Admitting: Physical Medicine & Rehabilitation

## 2023-03-04 ENCOUNTER — Telehealth: Payer: Self-pay | Admitting: Registered Nurse

## 2023-03-04 DIAGNOSIS — R5383 Other fatigue: Secondary | ICD-10-CM

## 2023-03-04 DIAGNOSIS — G479 Sleep disorder, unspecified: Secondary | ICD-10-CM

## 2023-03-04 DIAGNOSIS — S062X4S Diffuse traumatic brain injury with loss of consciousness of 6 hours to 24 hours, sequela: Secondary | ICD-10-CM

## 2023-03-04 MED ORDER — METHYLPHENIDATE HCL ER (OSM) 54 MG PO TBCR: 54.0000 mg | EXTENDED_RELEASE_TABLET | ORAL | 0 refills | Status: DC

## 2023-03-04 NOTE — Telephone Encounter (Signed)
Dr Riley Kill note was reviewed, PMP was Reviewed Concerta e-scribed for November and December.  Ms. France is aware of the above.

## 2023-03-21 ENCOUNTER — Other Ambulatory Visit: Payer: Self-pay | Admitting: Physical Medicine & Rehabilitation

## 2023-05-02 ENCOUNTER — Other Ambulatory Visit: Payer: Self-pay | Admitting: Physical Medicine & Rehabilitation

## 2023-05-02 ENCOUNTER — Encounter: Payer: Self-pay | Admitting: Physical Medicine & Rehabilitation

## 2023-05-02 DIAGNOSIS — G479 Sleep disorder, unspecified: Secondary | ICD-10-CM

## 2023-05-02 DIAGNOSIS — R5383 Other fatigue: Secondary | ICD-10-CM

## 2023-05-02 DIAGNOSIS — F329 Major depressive disorder, single episode, unspecified: Secondary | ICD-10-CM

## 2023-05-02 DIAGNOSIS — S062X4S Diffuse traumatic brain injury with loss of consciousness of 6 hours to 24 hours, sequela: Secondary | ICD-10-CM

## 2023-05-05 ENCOUNTER — Other Ambulatory Visit: Payer: Self-pay | Admitting: Physical Medicine & Rehabilitation

## 2023-05-05 ENCOUNTER — Ambulatory Visit: Payer: Medicare Other | Admitting: Psychology

## 2023-05-05 DIAGNOSIS — S062X4S Diffuse traumatic brain injury with loss of consciousness of 6 hours to 24 hours, sequela: Secondary | ICD-10-CM

## 2023-05-05 DIAGNOSIS — K219 Gastro-esophageal reflux disease without esophagitis: Secondary | ICD-10-CM

## 2023-05-05 MED ORDER — METHYLPHENIDATE HCL ER (OSM) 54 MG PO TBCR: 54.0000 mg | EXTENDED_RELEASE_TABLET | ORAL | 0 refills | Status: DC

## 2023-05-05 NOTE — Telephone Encounter (Signed)
 Both rx'es sent in for Jan and Feb

## 2023-06-30 ENCOUNTER — Encounter: Payer: Medicare Other | Attending: Psychology | Admitting: Psychology

## 2023-06-30 DIAGNOSIS — G479 Sleep disorder, unspecified: Secondary | ICD-10-CM | POA: Diagnosis not present

## 2023-06-30 DIAGNOSIS — S062X4S Diffuse traumatic brain injury with loss of consciousness of 6 hours to 24 hours, sequela: Secondary | ICD-10-CM | POA: Diagnosis not present

## 2023-06-30 DIAGNOSIS — F329 Major depressive disorder, single episode, unspecified: Secondary | ICD-10-CM | POA: Diagnosis present

## 2023-07-01 ENCOUNTER — Encounter: Payer: Self-pay | Admitting: Physical Medicine & Rehabilitation

## 2023-07-01 ENCOUNTER — Telehealth: Payer: Self-pay | Admitting: Registered Nurse

## 2023-07-01 DIAGNOSIS — S062X4S Diffuse traumatic brain injury with loss of consciousness of 6 hours to 24 hours, sequela: Secondary | ICD-10-CM

## 2023-07-01 DIAGNOSIS — R5383 Other fatigue: Secondary | ICD-10-CM

## 2023-07-01 DIAGNOSIS — G479 Sleep disorder, unspecified: Secondary | ICD-10-CM

## 2023-07-01 MED ORDER — METHYLPHENIDATE HCL ER (OSM) 54 MG PO TBCR: 54.0000 mg | EXTENDED_RELEASE_TABLET | ORAL | 0 refills | Status: DC

## 2023-07-01 NOTE — Telephone Encounter (Signed)
 PMP was Reviewed.  Methylphenidate e-scribed to pharmacy.  Mr. Mcglamery has a scheduled appointment with Dr Riley Kill.

## 2023-07-13 ENCOUNTER — Encounter: Payer: Self-pay | Admitting: Physical Medicine & Rehabilitation

## 2023-07-13 ENCOUNTER — Encounter: Payer: Medicare Other | Attending: Physical Medicine & Rehabilitation | Admitting: Physical Medicine & Rehabilitation

## 2023-07-13 VITALS — BP 111/78 | HR 84 | Ht 69.0 in | Wt 236.0 lb

## 2023-07-13 DIAGNOSIS — R5383 Other fatigue: Secondary | ICD-10-CM

## 2023-07-13 DIAGNOSIS — G479 Sleep disorder, unspecified: Secondary | ICD-10-CM

## 2023-07-13 DIAGNOSIS — S062X4S Diffuse traumatic brain injury with loss of consciousness of 6 hours to 24 hours, sequela: Secondary | ICD-10-CM | POA: Diagnosis not present

## 2023-07-13 MED ORDER — METHYLPHENIDATE HCL ER (OSM) 54 MG PO TBCR: 54.0000 mg | EXTENDED_RELEASE_TABLET | ORAL | 0 refills | Status: DC
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
## 2023-07-13 NOTE — Patient Instructions (Addendum)
 ALWAYS FEEL FREE TO CALL OUR OFFICE WITH ANY PROBLEMS OR QUESTIONS [phone])  **PLEASE NOTE** ALL MEDICATION REFILL REQUESTS (INCLUDING CONTROLLED SUBSTANCES) NEED TO BE MADE AT LEAST 7 DAYS PRIOR TO REFILL BEING DUE. ANY REFILL REQUESTS INSIDE THAT TIME FRAME MAY RESULT IN DELAYS IN RECEIVING YOUR PRESCRIPTION.     DECREASE DEPAKOTE TO 250MG  TWICE DAILY  AND OBSERVE HOW YOU FEEL IN THE AFTERNOONS. IF NO POSITIVE EFFECT ON YOUR CONCENTRATION/FOCUS THEN (IF YOU'RE MOOD IS OK) TRY STOPPING DEPAKOTE COMPLETELY AFTER ONE WEEK AT THE 250MG  TWICE DAILY DOSE.   IF THAT DOESN'T WORK, THEN WE CAN ADD A  SHORT ACTING STIMULANT.   (~2 WEEKS)

## 2023-07-14 MED ORDER — DIVALPROEX SODIUM 250 MG PO DR TAB: 250.0000 mg | DELAYED_RELEASE_TABLET | Freq: Two times a day (BID) | ORAL | 1 refills | Status: DC

## 2023-08-17 ENCOUNTER — Encounter: Payer: Self-pay | Admitting: Psychology
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
## 2023-08-21 ENCOUNTER — Encounter: Payer: Self-pay | Admitting: Physical Medicine & Rehabilitation

## 2023-08-26 MED ORDER — METHYLPHENIDATE HCL 5 MG PO TABS: 5.0000 mg | ORAL_TABLET | Freq: Every day | ORAL | 0 refills | Status: DC

## 2023-08-26 NOTE — Telephone Encounter (Signed)
 Added 5mg  ritalin  at 1000 daily to help with midday fatigue

## 2023-09-02 ENCOUNTER — Encounter: Payer: Self-pay | Admitting: Physical Medicine & Rehabilitation

## 2023-09-05 ENCOUNTER — Telehealth: Payer: Self-pay | Admitting: Registered Nurse

## 2023-09-05 DIAGNOSIS — S062X4S Diffuse traumatic brain injury with loss of consciousness of 6 hours to 24 hours, sequela: Secondary | ICD-10-CM

## 2023-09-05 DIAGNOSIS — R5383 Other fatigue: Secondary | ICD-10-CM

## 2023-09-05 DIAGNOSIS — G479 Sleep disorder, unspecified: Secondary | ICD-10-CM

## 2023-09-05 MED ORDER — METHYLPHENIDATE HCL ER (OSM) 54 MG PO TBCR: 54.0000 mg | EXTENDED_RELEASE_TABLET | ORAL | 0 refills | Status: DC

## 2023-09-05 NOTE — Telephone Encounter (Signed)
 PMP was Reviewed.  Dr Rachel Budds note was reviewed.  Concerta  54 MG e-scribed to pharmacy.  My- Chart message sent regarding the above.

## 2023-09-06 MED ORDER — METHYLPHENIDATE HCL ER (OSM) 54 MG PO TBCR: 54.0000 mg | EXTENDED_RELEASE_TABLET | ORAL | 0 refills | Status: DC

## 2023-09-06 NOTE — Telephone Encounter (Signed)
 Concerta  rx also written for June. Thx eunice!

## 2023-09-09 ENCOUNTER — Other Ambulatory Visit: Payer: Self-pay | Admitting: Physical Medicine & Rehabilitation

## 2023-09-21 MED ORDER — METHYLPHENIDATE HCL 5 MG PO TABS: 5.0000 mg | ORAL_TABLET | Freq: Every day | ORAL | 0 refills | Status: DC

## 2023-09-21 NOTE — Telephone Encounter (Signed)
 Rx sent.

## 2023-10-31 ENCOUNTER — Other Ambulatory Visit: Payer: Self-pay | Admitting: Physical Medicine & Rehabilitation

## 2023-10-31 DIAGNOSIS — S062X4S Diffuse traumatic brain injury with loss of consciousness of 6 hours to 24 hours, sequela: Secondary | ICD-10-CM

## 2023-10-31 DIAGNOSIS — F329 Major depressive disorder, single episode, unspecified: Secondary | ICD-10-CM

## 2023-10-31 DIAGNOSIS — K219 Gastro-esophageal reflux disease without esophagitis: Secondary | ICD-10-CM

## 2023-11-03 ENCOUNTER — Encounter: Payer: Self-pay | Admitting: Physical Medicine & Rehabilitation

## 2023-11-03 ENCOUNTER — Telehealth: Payer: Self-pay | Admitting: Registered Nurse

## 2023-11-03 DIAGNOSIS — R5383 Other fatigue: Secondary | ICD-10-CM

## 2023-11-03 DIAGNOSIS — G479 Sleep disorder, unspecified: Secondary | ICD-10-CM

## 2023-11-03 DIAGNOSIS — S062X4S Diffuse traumatic brain injury with loss of consciousness of 6 hours to 24 hours, sequela: Secondary | ICD-10-CM

## 2023-11-03 MED ORDER — METHYLPHENIDATE HCL ER (OSM) 54 MG PO TBCR
54.0000 mg | EXTENDED_RELEASE_TABLET | ORAL | 0 refills | Status: DC
Start: 2023-11-03 — End: 2023-11-03

## 2023-11-03 MED ORDER — METHYLPHENIDATE HCL ER (OSM) 54 MG PO TBCR: 54.0000 mg | EXTENDED_RELEASE_TABLET | ORAL | 0 refills | Status: DC

## 2023-11-03 NOTE — Telephone Encounter (Signed)
 Dr Babs note was reviewed. PMP was Reviewed, Concerta  e-scribed to pharmacy. For July and August

## 2023-11-04 MED ORDER — METHYLPHENIDATE HCL 5 MG PO TABS: 5.0000 mg | ORAL_TABLET | Freq: Every day | ORAL | 0 refills | Status: DC

## 2023-11-04 NOTE — Addendum Note (Signed)
 Addended by: CARILYN PRENTICE BRAVO on: 11/04/2023 09:28 AM   Modules accepted: Orders

## 2023-12-03 ENCOUNTER — Encounter: Payer: Self-pay | Admitting: Physical Medicine & Rehabilitation

## 2023-12-05 MED ORDER — METHYLPHENIDATE HCL 5 MG PO TABS: 5.0000 mg | ORAL_TABLET | Freq: Every day | ORAL | 0 refills | Status: DC

## 2023-12-07 ENCOUNTER — Other Ambulatory Visit: Payer: Self-pay | Admitting: Physical Medicine & Rehabilitation

## 2023-12-29 ENCOUNTER — Encounter: Payer: Self-pay | Admitting: Physical Medicine & Rehabilitation

## 2023-12-29 DIAGNOSIS — S062X4S Diffuse traumatic brain injury with loss of consciousness of 6 hours to 24 hours, sequela: Secondary | ICD-10-CM

## 2023-12-29 DIAGNOSIS — R5383 Other fatigue: Secondary | ICD-10-CM

## 2023-12-29 DIAGNOSIS — G479 Sleep disorder, unspecified: Secondary | ICD-10-CM

## 2023-12-29 MED ORDER — METHYLPHENIDATE HCL ER (OSM) 54 MG PO TBCR: 54.0000 mg | EXTENDED_RELEASE_TABLET | ORAL | 0 refills | Status: DC

## 2023-12-29 MED ORDER — METHYLPHENIDATE HCL 5 MG PO TABS: 5.0000 mg | ORAL_TABLET | Freq: Every day | ORAL | 0 refills | Status: DC
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
## 2024-01-11 ENCOUNTER — Encounter: Attending: Physical Medicine & Rehabilitation | Admitting: Physical Medicine & Rehabilitation

## 2024-01-11 ENCOUNTER — Encounter: Payer: Self-pay | Admitting: Physical Medicine & Rehabilitation

## 2024-01-11 VITALS — BP 104/72 | HR 89 | Ht 69.0 in | Wt 225.0 lb

## 2024-01-11 DIAGNOSIS — G479 Sleep disorder, unspecified: Secondary | ICD-10-CM | POA: Diagnosis not present

## 2024-01-11 DIAGNOSIS — S062X4S Diffuse traumatic brain injury with loss of consciousness of 6 hours to 24 hours, sequela: Secondary | ICD-10-CM | POA: Diagnosis not present

## 2024-01-11 DIAGNOSIS — R5383 Other fatigue: Secondary | ICD-10-CM | POA: Insufficient documentation

## 2024-01-11 MED ORDER — METHYLPHENIDATE HCL 5 MG PO TABS: 5.0000 mg | ORAL_TABLET | Freq: Two times a day (BID) | ORAL | 0 refills | Status: DC

## 2024-01-11 MED ORDER — METHYLPHENIDATE HCL ER (OSM) 54 MG PO TBCR: 54.0000 mg | EXTENDED_RELEASE_TABLET | ORAL | 0 refills | Status: DC

## 2024-01-11 NOTE — Patient Instructions (Signed)
 ALWAYS FEEL FREE TO CALL OUR OFFICE WITH ANY PROBLEMS OR QUESTIONS [phone])  **PLEASE NOTE** ALL MEDICATION REFILL REQUESTS (INCLUDING CONTROLLED SUBSTANCES) NEED TO BE MADE AT LEAST 7 DAYS PRIOR TO REFILL BEING DUE. ANY REFILL REQUESTS INSIDE THAT TIME FRAME MAY RESULT IN DELAYS IN RECEIVING YOUR PRESCRIPTION.

## 2024-03-02 ENCOUNTER — Encounter: Payer: Self-pay | Admitting: Physical Medicine & Rehabilitation

## 2024-03-02 DIAGNOSIS — G479 Sleep disorder, unspecified: Secondary | ICD-10-CM

## 2024-03-02 DIAGNOSIS — S062X4S Diffuse traumatic brain injury with loss of consciousness of 6 hours to 24 hours, sequela: Secondary | ICD-10-CM

## 2024-03-02 DIAGNOSIS — R5383 Other fatigue: Secondary | ICD-10-CM

## 2024-03-02 MED ORDER — METHYLPHENIDATE HCL 5 MG PO TABS: 5.0000 mg | ORAL_TABLET | Freq: Two times a day (BID) | ORAL | 0 refills | Status: DC

## 2024-03-02 MED ORDER — METHYLPHENIDATE HCL ER (OSM) 54 MG PO TBCR: 54.0000 mg | EXTENDED_RELEASE_TABLET | ORAL | 0 refills | Status: DC

## 2024-04-04 ENCOUNTER — Encounter: Payer: Self-pay | Admitting: Physical Medicine & Rehabilitation

## 2024-04-04 DIAGNOSIS — R5383 Other fatigue: Secondary | ICD-10-CM

## 2024-04-04 DIAGNOSIS — S062X4S Diffuse traumatic brain injury with loss of consciousness of 6 hours to 24 hours, sequela: Secondary | ICD-10-CM

## 2024-04-04 DIAGNOSIS — G479 Sleep disorder, unspecified: Secondary | ICD-10-CM

## 2024-04-06 MED ORDER — METHYLPHENIDATE HCL ER (OSM) 54 MG PO TBCR: 54.0000 mg | EXTENDED_RELEASE_TABLET | ORAL | 0 refills | Status: DC

## 2024-04-06 MED ORDER — METHYLPHENIDATE HCL 5 MG PO TABS: 5.0000 mg | ORAL_TABLET | Freq: Two times a day (BID) | ORAL | 0 refills | Status: DC

## 2024-04-06 MED ORDER — METHYLPHENIDATE HCL ER (OSM) 54 MG PO TBCR: 54.0000 mg | EXTENDED_RELEASE_TABLET | ORAL | 0 refills | Status: AC

## 2024-04-11 ENCOUNTER — Other Ambulatory Visit: Payer: Self-pay | Admitting: Physical Medicine & Rehabilitation

## 2024-04-16 ENCOUNTER — Encounter: Payer: Self-pay | Admitting: Physical Medicine & Rehabilitation

## 2024-04-17 NOTE — Telephone Encounter (Signed)
PA submitted to [facility]

## 2024-04-25 ENCOUNTER — Telehealth: Payer: Self-pay

## 2024-04-25 NOTE — Telephone Encounter (Signed)
 Submitted Confirmation #:[id number]

## 2024-05-09 ENCOUNTER — Encounter: Payer: Self-pay | Admitting: Physical Medicine & Rehabilitation

## 2024-05-09 ENCOUNTER — Encounter: Attending: Physical Medicine & Rehabilitation | Admitting: Physical Medicine & Rehabilitation

## 2024-05-09 DIAGNOSIS — G479 Sleep disorder, unspecified: Secondary | ICD-10-CM | POA: Diagnosis not present

## 2024-05-09 DIAGNOSIS — S062X4S Diffuse traumatic brain injury with loss of consciousness of 6 hours to 24 hours, sequela: Secondary | ICD-10-CM | POA: Insufficient documentation

## 2024-05-09 DIAGNOSIS — R5383 Other fatigue: Secondary | ICD-10-CM | POA: Diagnosis not present

## 2024-05-09 MED ORDER — METHYLPHENIDATE HCL ER (OSM) 54 MG PO TBCR: 54.0000 mg | EXTENDED_RELEASE_TABLET | ORAL | 0 refills | Status: AC

## 2024-05-09 MED ORDER — METHYLPHENIDATE HCL 10 MG PO TABS: 10.0000 mg | ORAL_TABLET | Freq: Every day | ORAL | 0 refills | Status: AC
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
## 2024-05-09 NOTE — Patient Instructions (Signed)
" °  VISIT SUMMARY: Today, you had a follow-up appointment to discuss your neuropsychiatric symptoms and medication management related to your traumatic brain injury. We reviewed your progress in cognitive function, behavioral changes, ADHD symptoms, chronic pain, and habitual snorting and coughing. Overall, you are making good progress, and we have made some adjustments to your treatment plan to support your continued recovery.  YOUR PLAN: -SEQUELAE OF TRAUMATIC BRAIN INJURY: You are experiencing ongoing improvement in cognitive and functional abilities, with some habitual snorting and coughing likely due to your brain injury. Continue using self-awareness and behavioral strategies to reduce these reflexive behaviors. Track your behaviors and progress, and keep up with your current routines and activities that support your recovery.  -MOOD AND BEHAVIORAL DISTURBANCES SECONDARY TO TRAUMATIC BRAIN INJURY: Your mood is generally good with occasional frustration, and your behavioral changes have resolved with the resumption of Zyprexa . Continue taking Zyprexa  2.5 mg at bedtime. We discussed the potential for future weaning off Zyprexa  and possibly transitioning to Seroquel  if needed. Maintain good sleep hygiene and consider non-medication strategies for energy and mood support. You were given handouts on dietary and natural options for energy.  -ATTENTION-DEFICIT HYPERACTIVITY DISORDER (ADHD): Your ADHD symptoms are managed with Concerta  and Ritalin . There is a concern about the cumulative effect of stimulants and caffeine on your heart. Continue with your current medication regimen and avoid increasing your stimulant or caffeine intake. Try to make dietary changes in the morning to improve your tolerance, even though you prefer not to change your coffee habits.  -CHRONIC PAIN MANAGEMENT: Your chronic pain is well controlled with ibuprofen  as needed and occasional use of a muscle relaxant. Continue with your  current pain management strategies, including wearing supportive footwear and staying physically active within your tolerance.  INSTRUCTIONS: Please continue with your current medication regimen and follow the advice provided for each issue. If you experience any new symptoms or have concerns, schedule a follow-up appointment.    "

## 2024-05-10 ENCOUNTER — Other Ambulatory Visit: Payer: Self-pay | Admitting: Physical Medicine & Rehabilitation

## 2024-05-10 DIAGNOSIS — K219 Gastro-esophageal reflux disease without esophagitis: Secondary | ICD-10-CM

## 2024-05-10 DIAGNOSIS — F329 Major depressive disorder, single episode, unspecified: Secondary | ICD-10-CM

## 2024-05-10 DIAGNOSIS — S062X4S Diffuse traumatic brain injury with loss of consciousness of 6 hours to 24 hours, sequela: Secondary | ICD-10-CM

## 2024-10-31 ENCOUNTER — Encounter: Admitting: Physical Medicine & Rehabilitation
